# Patient Record
Sex: Female | Born: 1935 | Race: White | Hispanic: No | Smoking: Never smoker
Health system: Southern US, Community
[De-identification: ages and names within clinical notes are randomized; demographics above are authoritative.]

## PROBLEM LIST (undated history)

## (undated) DIAGNOSIS — F32A Depression, unspecified: Secondary | ICD-10-CM

## (undated) DIAGNOSIS — Z8719 Personal history of other diseases of the digestive system: Secondary | ICD-10-CM

## (undated) DIAGNOSIS — K219 Gastro-esophageal reflux disease without esophagitis: Secondary | ICD-10-CM

## (undated) DIAGNOSIS — R011 Cardiac murmur, unspecified: Secondary | ICD-10-CM

## (undated) DIAGNOSIS — M1990 Unspecified osteoarthritis, unspecified site: Secondary | ICD-10-CM

## (undated) DIAGNOSIS — I509 Heart failure, unspecified: Secondary | ICD-10-CM

## (undated) DIAGNOSIS — N390 Urinary tract infection, site not specified: Secondary | ICD-10-CM

## (undated) DIAGNOSIS — M549 Dorsalgia, unspecified: Secondary | ICD-10-CM

## (undated) DIAGNOSIS — F419 Anxiety disorder, unspecified: Secondary | ICD-10-CM

## (undated) DIAGNOSIS — R0600 Dyspnea, unspecified: Secondary | ICD-10-CM

## (undated) DIAGNOSIS — R519 Headache, unspecified: Secondary | ICD-10-CM

## (undated) DIAGNOSIS — E039 Hypothyroidism, unspecified: Secondary | ICD-10-CM

## (undated) DIAGNOSIS — I480 Paroxysmal atrial fibrillation: Secondary | ICD-10-CM

## (undated) DIAGNOSIS — N189 Chronic kidney disease, unspecified: Secondary | ICD-10-CM

## (undated) DIAGNOSIS — I4891 Unspecified atrial fibrillation: Secondary | ICD-10-CM

## (undated) DIAGNOSIS — R600 Localized edema: Secondary | ICD-10-CM

## (undated) DIAGNOSIS — K625 Hemorrhage of anus and rectum: Secondary | ICD-10-CM

## (undated) DIAGNOSIS — E785 Hyperlipidemia, unspecified: Secondary | ICD-10-CM

## (undated) DIAGNOSIS — Z96642 Presence of left artificial hip joint: Secondary | ICD-10-CM

## (undated) DIAGNOSIS — H409 Unspecified glaucoma: Secondary | ICD-10-CM

## (undated) DIAGNOSIS — D649 Anemia, unspecified: Secondary | ICD-10-CM

## (undated) DIAGNOSIS — L89152 Pressure ulcer of sacral region, stage 2: Secondary | ICD-10-CM

## (undated) DIAGNOSIS — R053 Chronic cough: Secondary | ICD-10-CM

## (undated) DIAGNOSIS — G629 Polyneuropathy, unspecified: Secondary | ICD-10-CM

## (undated) DIAGNOSIS — I872 Venous insufficiency (chronic) (peripheral): Secondary | ICD-10-CM

## (undated) DIAGNOSIS — I10 Essential (primary) hypertension: Secondary | ICD-10-CM

## (undated) DIAGNOSIS — I729 Aneurysm of unspecified site: Secondary | ICD-10-CM

## (undated) DIAGNOSIS — M160 Bilateral primary osteoarthritis of hip: Secondary | ICD-10-CM

## (undated) HISTORY — DX: Depression, unspecified: F32.A

## (undated) HISTORY — DX: Dorsalgia, unspecified: M54.9

## (undated) HISTORY — DX: Chronic cough: R05.3

## (undated) HISTORY — DX: Pressure ulcer of sacral region, stage 2: L89.152

## (undated) HISTORY — DX: Urinary tract infection, site not specified: N39.0

## (undated) HISTORY — DX: Paroxysmal atrial fibrillation: I48.0

## (undated) HISTORY — DX: Polyneuropathy, unspecified: G62.9

## (undated) HISTORY — PX: SHOULDER SURGERY: SHX246

## (undated) HISTORY — DX: Localized edema: R60.0

## (undated) HISTORY — DX: Headache, unspecified: R51.9

## (undated) HISTORY — DX: Anemia, unspecified: D64.9

## (undated) HISTORY — PX: CHOLECYSTECTOMY: SHX55

## (undated) HISTORY — DX: Unspecified glaucoma: H40.9

## (undated) HISTORY — DX: Bilateral primary osteoarthritis of hip: M16.0

## (undated) HISTORY — PX: CEREBRAL ANEURYSM REPAIR: SHX164

## (undated) HISTORY — DX: Presence of left artificial hip joint: Z96.642

## (undated) HISTORY — DX: Hyperlipidemia, unspecified: E78.5

## (undated) HISTORY — DX: Unspecified atrial fibrillation: I48.91

## (undated) HISTORY — PX: CATARACT EXTRACTION, BILATERAL: SHX1313

## (undated) HISTORY — DX: Hemorrhage of anus and rectum: K62.5

## (undated) HISTORY — DX: Venous insufficiency (chronic) (peripheral): I87.2

## (undated) HISTORY — PX: ABDOMINAL HYSTERECTOMY: SHX81

## (undated) HISTORY — PX: ANKLE FRACTURE SURGERY: SHX122

## (undated) HISTORY — DX: Unspecified osteoarthritis, unspecified site: M19.90

## (undated) HISTORY — PX: JOINT REPLACEMENT: SHX530

---

## 1971-02-06 DIAGNOSIS — Z90710 Acquired absence of both cervix and uterus: Secondary | ICD-10-CM

## 1971-02-06 HISTORY — DX: Acquired absence of both cervix and uterus: Z90.710

## 1991-02-06 DIAGNOSIS — Z96641 Presence of right artificial hip joint: Secondary | ICD-10-CM

## 1991-02-06 HISTORY — DX: Presence of right artificial hip joint: Z96.641

## 1991-02-06 HISTORY — PX: TOTAL HIP ARTHROPLASTY: SHX124

## 1999-01-18 ENCOUNTER — Encounter: Admission: RE | Admit: 1999-01-18 | Discharge: 1999-01-18 | Payer: Self-pay | Admitting: Family Medicine

## 1999-01-18 ENCOUNTER — Encounter: Payer: Self-pay | Admitting: Family Medicine

## 1999-10-19 ENCOUNTER — Other Ambulatory Visit: Admission: RE | Admit: 1999-10-19 | Discharge: 1999-10-19 | Payer: Self-pay | Admitting: Gynecology

## 2000-02-22 ENCOUNTER — Encounter: Admission: RE | Admit: 2000-02-22 | Discharge: 2000-02-22 | Payer: Self-pay | Admitting: Family Medicine

## 2000-02-22 ENCOUNTER — Encounter: Payer: Self-pay | Admitting: Family Medicine

## 2000-10-30 ENCOUNTER — Emergency Department (HOSPITAL_COMMUNITY): Admission: EM | Admit: 2000-10-30 | Discharge: 2000-10-30 | Payer: Self-pay | Admitting: Emergency Medicine

## 2000-10-30 ENCOUNTER — Encounter: Payer: Self-pay | Admitting: Emergency Medicine

## 2000-11-08 ENCOUNTER — Encounter: Payer: Self-pay | Admitting: Orthopedic Surgery

## 2000-11-12 ENCOUNTER — Inpatient Hospital Stay (HOSPITAL_COMMUNITY): Admission: AD | Admit: 2000-11-12 | Discharge: 2000-11-13 | Payer: Self-pay | Admitting: Orthopedic Surgery

## 2001-02-05 DIAGNOSIS — Z9889 Other specified postprocedural states: Secondary | ICD-10-CM

## 2001-02-05 HISTORY — PX: CHOLECYSTECTOMY: SHX55

## 2001-02-05 HISTORY — DX: Other specified postprocedural states: Z98.890

## 2001-02-05 HISTORY — PX: GALLBLADDER SURGERY: SHX652

## 2001-03-17 ENCOUNTER — Encounter: Admission: RE | Admit: 2001-03-17 | Discharge: 2001-03-17 | Payer: Self-pay | Admitting: Gynecology

## 2001-03-17 ENCOUNTER — Encounter: Payer: Self-pay | Admitting: Gynecology

## 2001-03-18 ENCOUNTER — Encounter: Payer: Self-pay | Admitting: Orthopedic Surgery

## 2001-03-18 ENCOUNTER — Encounter: Admission: RE | Admit: 2001-03-18 | Discharge: 2001-03-18 | Payer: Self-pay | Admitting: Orthopedic Surgery

## 2001-03-26 ENCOUNTER — Encounter: Admission: RE | Admit: 2001-03-26 | Discharge: 2001-03-26 | Payer: Self-pay | Admitting: Gynecology

## 2001-03-26 ENCOUNTER — Encounter: Payer: Self-pay | Admitting: Gynecology

## 2001-03-27 ENCOUNTER — Encounter: Payer: Self-pay | Admitting: Orthopedic Surgery

## 2001-03-27 ENCOUNTER — Ambulatory Visit (HOSPITAL_COMMUNITY): Admission: RE | Admit: 2001-03-27 | Discharge: 2001-03-27 | Payer: Self-pay | Admitting: Orthopedic Surgery

## 2001-12-09 ENCOUNTER — Encounter: Payer: Self-pay | Admitting: General Surgery

## 2001-12-11 ENCOUNTER — Encounter (INDEPENDENT_AMBULATORY_CARE_PROVIDER_SITE_OTHER): Payer: Self-pay | Admitting: Specialist

## 2001-12-11 ENCOUNTER — Encounter: Payer: Self-pay | Admitting: General Surgery

## 2001-12-12 ENCOUNTER — Encounter: Payer: Self-pay | Admitting: General Surgery

## 2001-12-12 ENCOUNTER — Inpatient Hospital Stay (HOSPITAL_COMMUNITY): Admission: AD | Admit: 2001-12-12 | Discharge: 2001-12-13 | Payer: Self-pay | Admitting: General Surgery

## 2002-02-10 ENCOUNTER — Other Ambulatory Visit: Admission: RE | Admit: 2002-02-10 | Discharge: 2002-02-10 | Payer: Self-pay | Admitting: Gynecology

## 2002-05-04 ENCOUNTER — Encounter: Payer: Self-pay | Admitting: Gynecology

## 2002-05-04 ENCOUNTER — Encounter: Admission: RE | Admit: 2002-05-04 | Discharge: 2002-05-04 | Payer: Self-pay | Admitting: Gynecology

## 2002-10-07 ENCOUNTER — Encounter: Payer: Self-pay | Admitting: Gastroenterology

## 2002-10-07 ENCOUNTER — Encounter: Admission: RE | Admit: 2002-10-07 | Discharge: 2002-10-07 | Payer: Self-pay | Admitting: Gastroenterology

## 2002-10-26 ENCOUNTER — Ambulatory Visit (HOSPITAL_COMMUNITY): Admission: RE | Admit: 2002-10-26 | Discharge: 2002-10-26 | Payer: Self-pay | Admitting: Gastroenterology

## 2002-10-26 ENCOUNTER — Encounter (INDEPENDENT_AMBULATORY_CARE_PROVIDER_SITE_OTHER): Payer: Self-pay | Admitting: Specialist

## 2002-11-03 ENCOUNTER — Encounter: Admission: RE | Admit: 2002-11-03 | Discharge: 2002-11-03 | Payer: Self-pay | Admitting: Gastroenterology

## 2002-11-03 ENCOUNTER — Encounter: Payer: Self-pay | Admitting: Gastroenterology

## 2003-07-26 ENCOUNTER — Encounter: Admission: RE | Admit: 2003-07-26 | Discharge: 2003-07-26 | Payer: Self-pay | Admitting: Gynecology

## 2004-06-14 ENCOUNTER — Ambulatory Visit (HOSPITAL_COMMUNITY): Admission: RE | Admit: 2004-06-14 | Discharge: 2004-06-14 | Payer: Self-pay | Admitting: Gastroenterology

## 2004-06-14 ENCOUNTER — Encounter (INDEPENDENT_AMBULATORY_CARE_PROVIDER_SITE_OTHER): Payer: Self-pay | Admitting: Specialist

## 2004-07-25 ENCOUNTER — Ambulatory Visit (HOSPITAL_COMMUNITY): Admission: RE | Admit: 2004-07-25 | Discharge: 2004-07-25 | Payer: Self-pay | Admitting: *Deleted

## 2004-09-27 ENCOUNTER — Encounter: Admission: RE | Admit: 2004-09-27 | Discharge: 2004-09-27 | Payer: Self-pay | Admitting: Family Medicine

## 2005-04-18 ENCOUNTER — Encounter: Admission: RE | Admit: 2005-04-18 | Discharge: 2005-04-18 | Payer: Self-pay | Admitting: *Deleted

## 2005-09-25 ENCOUNTER — Encounter: Payer: Self-pay | Admitting: Vascular Surgery

## 2005-09-25 ENCOUNTER — Ambulatory Visit: Admission: RE | Admit: 2005-09-25 | Discharge: 2005-09-25 | Payer: Self-pay | Admitting: Family Medicine

## 2006-04-02 ENCOUNTER — Other Ambulatory Visit: Admission: RE | Admit: 2006-04-02 | Discharge: 2006-04-02 | Payer: Self-pay | Admitting: Family Medicine

## 2006-04-17 ENCOUNTER — Encounter: Admission: RE | Admit: 2006-04-17 | Discharge: 2006-04-17 | Payer: Self-pay | Admitting: Family Medicine

## 2007-02-06 DIAGNOSIS — Z8679 Personal history of other diseases of the circulatory system: Secondary | ICD-10-CM

## 2007-02-06 DIAGNOSIS — I671 Cerebral aneurysm, nonruptured: Secondary | ICD-10-CM

## 2007-02-06 HISTORY — DX: Personal history of other diseases of the circulatory system: Z86.79

## 2007-02-06 HISTORY — PX: ANKLE SURGERY: SHX546

## 2007-02-06 HISTORY — DX: Cerebral aneurysm, nonruptured: I67.1

## 2007-02-13 ENCOUNTER — Observation Stay (HOSPITAL_COMMUNITY): Admission: EM | Admit: 2007-02-13 | Discharge: 2007-02-15 | Payer: Self-pay | Admitting: Emergency Medicine

## 2007-02-27 ENCOUNTER — Ambulatory Visit (HOSPITAL_COMMUNITY): Admission: AD | Admit: 2007-02-27 | Discharge: 2007-02-27 | Payer: Self-pay | Admitting: Orthopedic Surgery

## 2007-03-21 ENCOUNTER — Encounter: Admission: RE | Admit: 2007-03-21 | Discharge: 2007-03-21 | Payer: Self-pay | Admitting: Orthopedic Surgery

## 2007-05-19 ENCOUNTER — Inpatient Hospital Stay (HOSPITAL_COMMUNITY): Admission: AD | Admit: 2007-05-19 | Discharge: 2007-06-09 | Payer: Self-pay | Admitting: Neurosurgery

## 2007-05-19 ENCOUNTER — Ambulatory Visit: Payer: Self-pay | Admitting: Internal Medicine

## 2007-05-19 ENCOUNTER — Encounter: Payer: Self-pay | Admitting: Emergency Medicine

## 2007-06-06 ENCOUNTER — Ambulatory Visit: Payer: Self-pay | Admitting: Physical Medicine & Rehabilitation

## 2007-06-06 DIAGNOSIS — I609 Nontraumatic subarachnoid hemorrhage, unspecified: Secondary | ICD-10-CM

## 2007-06-06 HISTORY — DX: Nontraumatic subarachnoid hemorrhage, unspecified: I60.9

## 2007-06-09 ENCOUNTER — Ambulatory Visit: Payer: Self-pay | Admitting: Physical Medicine & Rehabilitation

## 2007-06-09 ENCOUNTER — Inpatient Hospital Stay (HOSPITAL_COMMUNITY)
Admission: RE | Admit: 2007-06-09 | Discharge: 2007-06-24 | Payer: Self-pay | Admitting: Physical Medicine & Rehabilitation

## 2007-07-14 ENCOUNTER — Ambulatory Visit (HOSPITAL_COMMUNITY): Admission: RE | Admit: 2007-07-14 | Discharge: 2007-07-14 | Payer: Self-pay | Admitting: Neurosurgery

## 2007-07-14 ENCOUNTER — Encounter
Admission: RE | Admit: 2007-07-14 | Discharge: 2007-10-12 | Payer: Self-pay | Admitting: Physical Medicine & Rehabilitation

## 2007-07-17 ENCOUNTER — Emergency Department (HOSPITAL_COMMUNITY): Admission: EM | Admit: 2007-07-17 | Discharge: 2007-07-17 | Payer: Self-pay | Admitting: Emergency Medicine

## 2007-07-24 ENCOUNTER — Encounter
Admission: RE | Admit: 2007-07-24 | Discharge: 2007-07-28 | Payer: Self-pay | Admitting: Physical Medicine & Rehabilitation

## 2007-07-28 ENCOUNTER — Ambulatory Visit: Payer: Self-pay | Admitting: Physical Medicine & Rehabilitation

## 2007-08-20 ENCOUNTER — Encounter: Admission: RE | Admit: 2007-08-20 | Discharge: 2007-08-20 | Payer: Self-pay | Admitting: Neurosurgery

## 2007-10-31 ENCOUNTER — Encounter
Admission: RE | Admit: 2007-10-31 | Discharge: 2007-11-03 | Payer: Self-pay | Admitting: Physical Medicine & Rehabilitation

## 2007-11-03 ENCOUNTER — Ambulatory Visit: Payer: Self-pay | Admitting: Physical Medicine & Rehabilitation

## 2008-10-29 IMAGING — CR DG CERVICAL SPINE 2 OR 3 VIEWS
5 series · 5 of 5 positions shown · non-contrast
Comparison: None

CLINICAL DATA: Left side weakness

CERVICAL SPINE - 2-3 VIEW

[w c-spine lat]
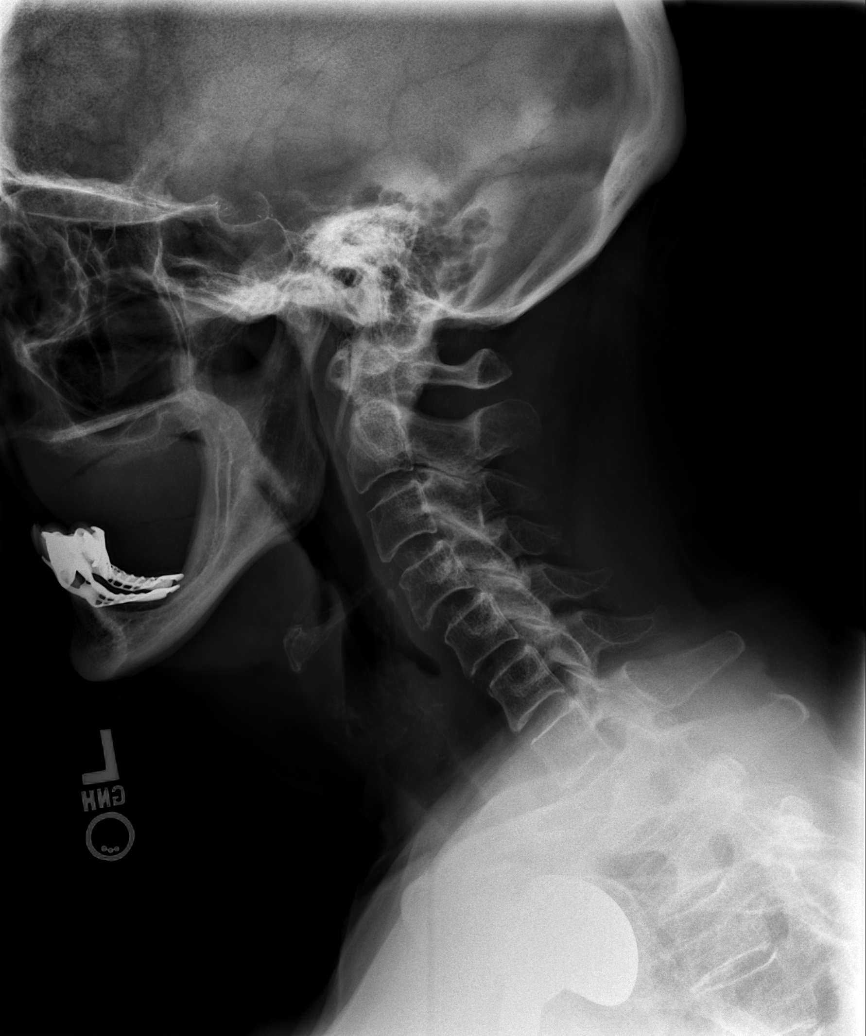

[w c-spine a.p. (1 of 2)]
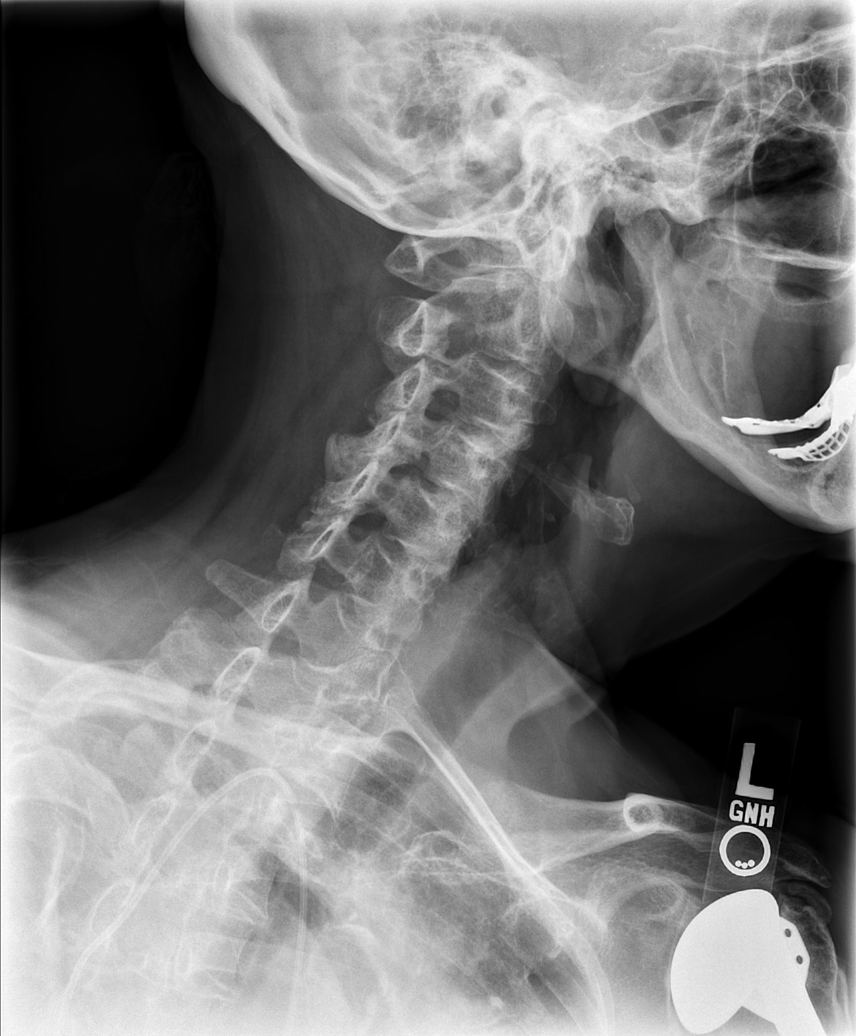

[w c-spine odontoid (1 of 2)]
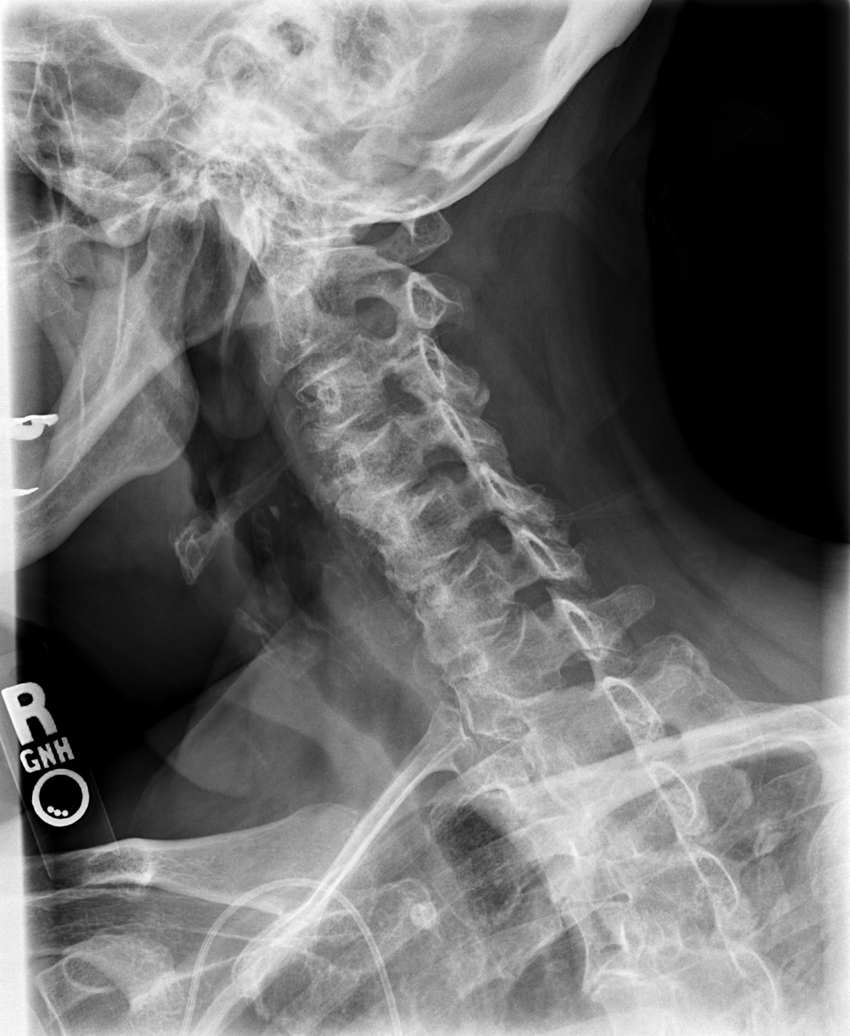

[w c-spine a.p. (2 of 2)]
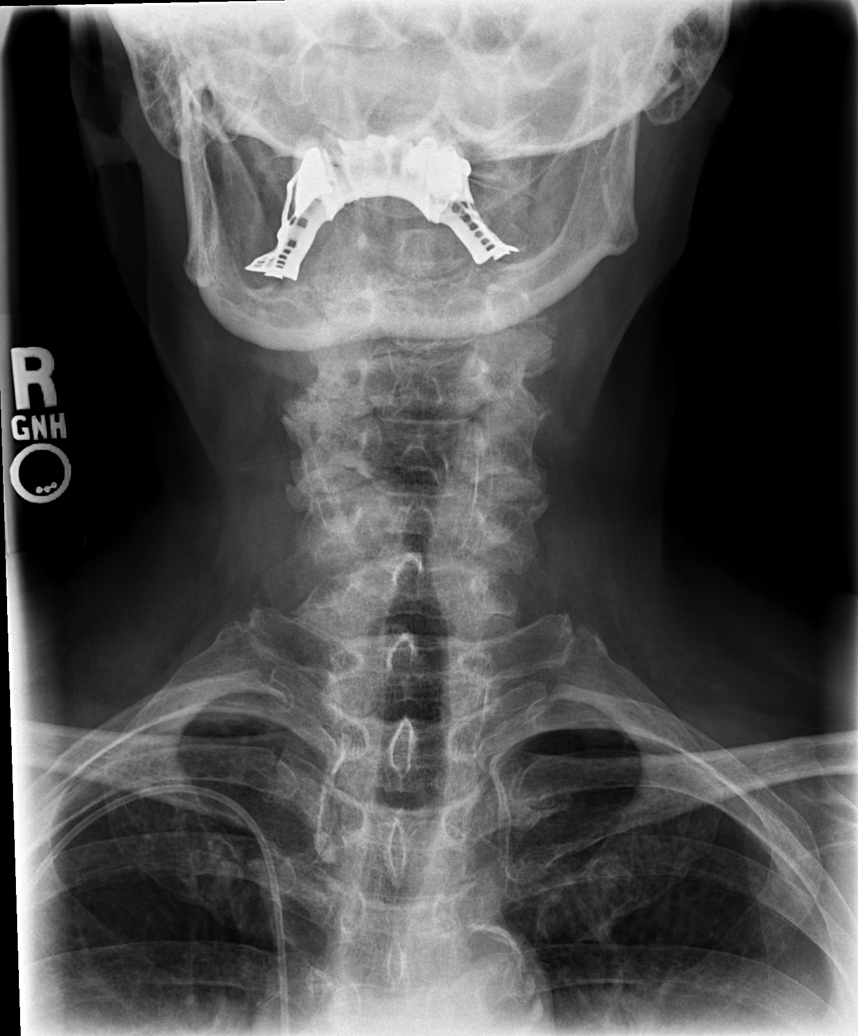

[w c-spine odontoid (2 of 2)]
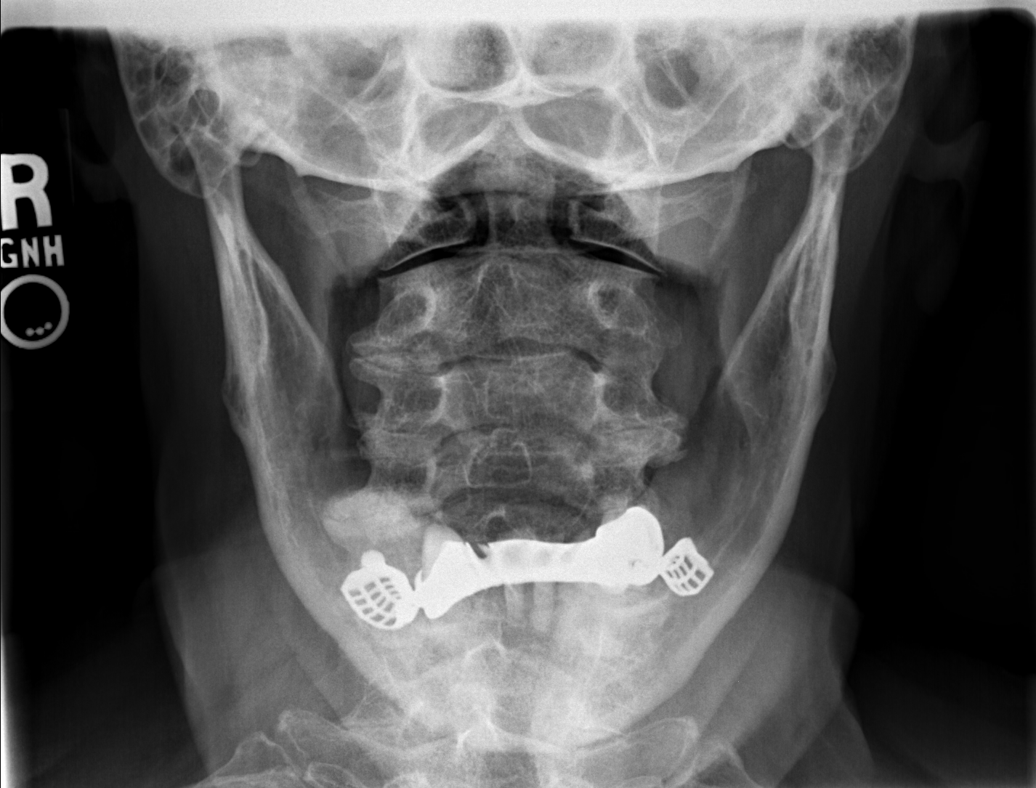

[5 of 5 positions shown; findings below may reference images not displayed]

FINDINGS: Bones are diffusely demineralized.  There is no evidence
for acute fracture.  Trace anterolisthesis of C4 on 5 is likely
secondary to the facet disease at this level.  There is prominent
facet osteoarthritis on the right at C4-5 and on the left at C3-4
and C5-6.
IMPRESSION: No acute bony abnormality.

## 2010-02-06 ENCOUNTER — Inpatient Hospital Stay (HOSPITAL_COMMUNITY)
Admission: EM | Admit: 2010-02-06 | Discharge: 2010-02-08 | Payer: Self-pay | Source: Home / Self Care | Admitting: Emergency Medicine

## 2010-02-08 LAB — COMPREHENSIVE METABOLIC PANEL
ALT: 55 U/L — ABNORMAL HIGH (ref 0–35)
AST: 48 U/L — ABNORMAL HIGH (ref 0–37)
Albumin: 2.4 g/dL — ABNORMAL LOW (ref 3.5–5.2)
Alkaline Phosphatase: 172 U/L — ABNORMAL HIGH (ref 39–117)
BUN: 10 mg/dL (ref 6–23)
CO2: 27 mEq/L (ref 19–32)
Calcium: 8.5 mg/dL (ref 8.4–10.5)
Chloride: 103 mEq/L (ref 96–112)
Creatinine, Ser: 1.04 mg/dL (ref 0.4–1.2)
GFR calc Af Amer: >60 mL/min (ref >60)
GFR calc non Af Amer: 51 mL/min — ABNORMAL LOW (ref >60)
Glucose, Bld: 119 mg/dL — ABNORMAL HIGH (ref 70–99)
Potassium: 3.9 mEq/L (ref 3.5–5.1)
Sodium: 137 mEq/L (ref 135–145)
Total Bilirubin: 0.5 mg/dL (ref 0.3–1.2)
Total Protein: 5.7 g/dL — ABNORMAL LOW (ref 6.0–8.3)

## 2010-02-08 LAB — CBC
HCT: 34 % — ABNORMAL LOW (ref 36.0–46.0)
Hemoglobin: 10.9 g/dL — ABNORMAL LOW (ref 12.0–15.0)
MCH: 30 pg (ref 26.0–34.0)
MCHC: 32.1 g/dL (ref 30.0–36.0)
MCV: 93.7 fL (ref 78.0–100.0)
Platelets: 177 10*3/uL (ref 150–400)
RBC: 3.63 MIL/uL — ABNORMAL LOW (ref 3.87–5.11)
RDW: 13.8 % (ref 11.5–15.5)
WBC: 8.2 10*3/uL (ref 4.0–10.5)

## 2010-02-26 ENCOUNTER — Encounter: Payer: Self-pay | Admitting: Neurosurgery

## 2010-03-09 NOTE — H&P (Signed)
[name], [name]                ACCOUNT NO.:  [id number]  MEDICAL RECORD NO.:  [id number]          PATIENT TYPE:  EMS  LOCATION:  MAJO                         FACILITY:  [facility]  PHYSICIAN:  Ladell Pier, M.D.   DATE OF BIRTH:  [birth year]  DATE OF ADMISSION:  02/06/2010 DATE OF DISCHARGE:                             HISTORY & PHYSICAL   CHIEF COMPLAINT:  Urinary frequency, fevers and headache.  HISTORY OF PRESENT ILLNESS:  The patient is an 77 year old white female with past medical history significant for multiple medical problems including an aneurysm back in 2009.  She also has hypertension, dyslipidemia, GERD, depression.  The patient lives alone.  She was brought to the emergency room by her daughter secondary to increase in nausea for the past 3 days.  She normally has some underlying nausea for the past 3 years since her cerebral aneurysm.  She also complained of some headache in the posterior section of her head.  She states that she has chronic headache but that it has gotten worse over the past 3 days. She has no vomiting.  No diarrhea.  She also has decreased p.o. intake and urinary frequency.  She checked her temperature last night and it was 101.4.  She also has had some chills.  No abdominal pain.  Maybe a little bit of pelvic pressure.  PAST MEDICAL HISTORY: 1. Significant for hypertension. 2. Cerebral aneurysm. 3. Hypothyroidism. 4. Depression. 5. GERD. 6. Dyslipidemia.  PAST SURGICAL HISTORY: 1. Cholecystectomy. 2. Hip replacement. 3. Hysterectomy. 4. Rotator cuff repair. 5. History of cerebral aneurysm back in 2009. 6. Ankle surgery.  FAMILY HISTORY:  Both parents are deceased.  SOCIAL HISTORY:  The patient is widowed.  She does not smoke.  She drinks socially.  She lives alone.  She has 3 children.  MEDICATIONS: 1. Synthroid 88 mcg daily. 2. Sertraline 100 mg daily. 3. Ranitidine 300 mg twice daily. 4. Pravachol 20 mg daily. 5. Metoprolol  100 mg half a tab twice daily. 6. Maxzide 37.5/25 mg daily. 7. Aspirin 325 mg daily.  ALLERGIES:  To MORPHINE, causes her to have altered mental status.  REVIEW OF SYSTEMS:  Negative, otherwise stated in HPI.  PHYSICAL EXAM:  VITAL SIGNS:  Temperature 98.5, pulse of 80, respirations 20.  Blood pressure 148/60, pulse ox 100% on room air. GENERAL:  The patient is an obese white female lying on the stretcher. HEENT:  Normocephalic, atraumatic.  Pupils reactive to light.  Throat is without erythema. CARDIOVASCULAR:  Regular rate and rhythm. LUNGS:  Clear bilaterally. ABDOMEN:  Obese.  Positive bowel sounds.  Maybe a mild tenderness in the pelvic area. EXTREMITIES:  2+ edema left greater than right, that is chronic. NEUROLOGICAL:  Nonfocal.  It was difficult for her to raise her right leg secondary to back pain, otherwise cranial nerves II-XII intact.  Labs:  Urine too many to count WBCs, positive nitrite and large leukocyte in the urine.  Sodium 136, potassium 4.8, chloride 100, CO2 27, glucose 143, BUN 30, creatinine 0.95, calcium 8.8, WBC 13.5, hemoglobin 12.8, MCV 91.9, platelets 195.  Chest x-ray left lung base atelectasis without acute  cardiopulmonary process.  Head CT stable appearance of right ICA stent.  No acute bleed or infarct identified.  No acute hemorrhage, infarct or mass effect identified.  Ventricles are normal.  No extra-axial fluid collection. Skull is unremarkable.  ASSESSMENT AND PLAN: 1. Acute pyelonephritis. 2. Hypertension. 3. Hypothyroidism. 4. Depression. 5. Gastroesophageal reflux disease. 6. Dyslipidemia. 7. Mild headache with history of cerebral aneurysm.  Will admit the patient to the hospital, treat her acute pyelonephritis with IV Rocephin, get urine culture, get blood cultures.  For the hypertension will continue her on her home medications.  For her thyroid problems will check TSH.  Depression:  Continue antidepressant. Gastroesophageal  reflux disease:  Continue proton pump inhibitor. Dyslipidemia:  Continue cholesterol medications.  With her history of cerebral aneurysm if she does develop any sort of severe headache or neck stiffness then will get an MRI to evaluate her aneurysm but right now she does not have any focal neurologic deficit, her headache is much better since she has been treated for the urinary tract infection.  Time spent with the patient and doing this admission and talking to daughter is approximately 1 hour.     Ladell Pier, M.D.     [place]/MEDQ  D:  02/06/2010  T:  02/06/2010  Job:  [id number]  cc:   Dr Tiburcio Pea  Electronically Signed by Ladell Pier M.D. on 03/09/2010 01:39:31 PM

## 2010-04-17 LAB — COMPREHENSIVE METABOLIC PANEL
ALT: 63 U/L — ABNORMAL HIGH (ref 0–35)
CO2: 24 mEq/L (ref 19–32)
Calcium: 8.1 mg/dL — ABNORMAL LOW (ref 8.4–10.5)
GFR calc non Af Amer: 48 mL/min — ABNORMAL LOW (ref >60)
Glucose, Bld: 127 mg/dL — ABNORMAL HIGH (ref 70–99)
Sodium: 136 mEq/L (ref 135–145)
Total Bilirubin: 0.8 mg/dL (ref 0.3–1.2)

## 2010-04-17 LAB — CULTURE, BLOOD (ROUTINE X 2)
Culture  Setup Time: 201201022322
Culture  Setup Time: 201201022322
Culture: NO GROWTH
Culture: NO GROWTH

## 2010-04-17 LAB — URINE MICROSCOPIC-ADD ON

## 2010-04-17 LAB — URINALYSIS, ROUTINE W REFLEX MICROSCOPIC
Ketones, ur: NEGATIVE mg/dL
Nitrite: POSITIVE — AB
Protein, ur: 100 mg/dL — AB

## 2010-04-17 LAB — DIFFERENTIAL
Basophils Absolute: 0 10*3/uL (ref 0.0–0.1)
Basophils Relative: 0 % (ref 0–1)
Eosinophils Absolute: 0 10*3/uL (ref 0.0–0.7)
Eosinophils Relative: 0 % (ref 0–5)

## 2010-04-17 LAB — BASIC METABOLIC PANEL
BUN: 13 mg/dL (ref 6–23)
CO2: 27 mEq/L (ref 19–32)
Chloride: 100 mEq/L (ref 96–112)
Creatinine, Ser: 0.95 mg/dL (ref 0.4–1.2)

## 2010-04-17 LAB — URINE CULTURE
Colony Count: >=100000
Culture  Setup Time: 201201022027

## 2010-04-17 LAB — CBC
HCT: 34.5 % — ABNORMAL LOW (ref 36.0–46.0)
Hemoglobin: 11.2 g/dL — ABNORMAL LOW (ref 12.0–15.0)
MCH: 30.3 pg (ref 26.0–34.0)
MCH: 30.4 pg (ref 26.0–34.0)
MCHC: 32.5 g/dL (ref 30.0–36.0)
MCV: 91.9 fL (ref 78.0–100.0)
Platelets: 195 10*3/uL (ref 150–400)
RDW: 13.6 % (ref 11.5–15.5)

## 2010-04-17 LAB — TSH: TSH: 1.563 u[IU]/mL (ref 0.350–4.500)

## 2010-06-20 NOTE — Op Note (Signed)
NAMEPAOLINA, KARWOWSKI                ACCOUNT NO.:  [id number]   MEDICAL RECORD NO.:  [id number]          PATIENT TYPE:  INP   LOCATION:  2899                         FACILITY:  [facility]   PHYSICIAN:  Doralee Albino. Carola Frost, M.D. DATE OF BIRTH:  07/07/1927   DATE OF PROCEDURE:  02/27/2007  DATE OF DISCHARGE:                               OPERATIVE REPORT   PREOPERATIVE DIAGNOSIS:  Left bimalleolar ankle fracture.   POSTOPERATIVE DIAGNOSIS:  Left bimalleolar ankle fracture.   PROCEDURE:  1. Open reduction and internal fixation of left bimalleolar ankle      fracture.  2. Stress view of the ankle using fluoroscopy to revaluate the      syndesmosis.   SURGEON:  Doralee Albino. Carola Frost, MD   ASSISTANT:  Mearl Latin, PA   ANESTHESIA:  General.   FINDINGS:  Stable syndesmosis.   ESTIMATED BLOOD LOSS:  50 mL.   COMPLICATIONS:  None.   TOURNIQUET:  None.   DISPOSITION:  PACU.   CONDITION:  Stable.   BRIEF SUMMARY AND INDICATION FOR PROCEDURE:  Hatley Henegar is a 77-year-  old female with a left displaced bimalleolar fracture dislocation  initially treated with closed reduction of the ankle dislocation, as  well as 10-14 days of soft tissue surveillance and a posterior and  stirrup splint.  The patient's skin was eventually deemed stable for  surgical intervention.  At that time she had some slight residual  displacement of her fibula, but appeared to have an anatomic reduction  of the medial malleolus.  We discussed preoperative risks and benefits  of surgery including the possibility of infection, nerve injury, vessel  injury, need for further surgery, DVT, PE, arthritis, decreased range of  motion, malunion, nonunion, others including heart attack and stroke.  After full discussion the patient wished to proceed.   BRIEF DESCRIPTION OF PROCEDURE:  Ms. Demonte was administered  preoperative antibiotics, taken to the operating room where general  anesthesia was induced.  The left lower  extremity was prepped and draped  in the usual sterile fashion.  No tourniquet was used during the  procedure.  We made a standard lateral approach to the distal fibula,  looking carefully for the superficial peroneal nerve in the proximal  portion of the wound.  It was not identified, but presumed in the  anterior soft tissues and again meticulous, careful dissection was  performed through this area.  As we continued distally, we encountered  the fracture site, would not remove any of the surrounding periosteum.  We allowed for slight distraction and cleaned the fracture site with a  small curet and lavage.  We were then able to obtain an anatomic  reduction and hold this with a lobster clamp.  We then placed an  anterior to posterior lag screw and a posterior buttress plate that  pushed directly against the forces of displacement, which was in the  posterolateral displacement.  Once this was placed, we obtained AP  mortise and lateral views showing what appeared to be anatomic  reduction.  As there was no displacement of the medial malleolus, we  decide to go ahead and proceed with percutaneous placement of 2 medial  malleolar screws.  The first screw was slightly lateral to the distal  tip, but did obtain a good purchase.  We are concerned about placing  either an anterior or posterior screw, as this could resulting in  cracking the fracture of the medial malleolus, and consequently we chose  to place 1 medial to this and obtained excellent purchase here as well,  making sure that this was out of the joint.  It was, with the shaft of  the screw passing just medial to the joint line and this was confirmed  on our drill images as well.  Thirty-five partially threaded screws were  placed, attaining a good compression. We then irrigated both sides  thoroughly after obtaining final 3-vew images, and performed a standard  layered closure with 2-0 Vicryl and 3-0 nylon.  Sterile, gently   compressive dressing was applied, and then a posterior and stirrup  splint.   The patient was awakened from anesthesia and transferred to PACU in  stable condition.   It should be noted that following fixation we did perform a live fluoro  stress view using external rotation to hold the ankle in the mortise  projection and did not see any syndesmotic widening or other signs of  syndesmotic instability.   PROGNOSIS:  Ms. Quist should do well following repair of her  bimalleolar ankle fracture.  She will be non weightbearing for the next  6 weeks with graduated weightbearing thereafter.  She will have  unrestricted range of motion when she returns in 2 weeks, at which time  we will remove her splint and apply a removable brace.  She will  continue on DVT prophylaxis as well.      Doralee Albino. Carola Frost, M.D.  Electronically Signed     MHH/MEDQ  D:  02/27/2007  T:  02/27/2007  Job:  [id number]

## 2010-06-20 NOTE — Assessment & Plan Note (Signed)
HISTORY:  [name] returns to clinic today accompanied by her great-  granddaughter.  The patient is a 77 year old female with a history of  hypertension and glaucoma.  She was admitted to [facility] on  May 19, 2007, with severe headaches of 2 days duration with decrease  in mental status.  She was found to have an extensive subarachnoid  hemorrhage requiring intraventricular catheter placement by Dr.  Newell [name].  She was independent on the same day for airway protection.  Attempts of coiling were unsuccessful and she underwent stenting of the  right internal cerebral artery aneurysm.  There was no change in the  circulation per Interventional Radiology.  She was self-extubated on  May 26, 2007, but required reintubation on May 27, 2007, secondary  to fever and desaturations.  She was finally extubated on May 29, 2007.  Repeat angiography was done on June 02, 2007, with stent  placement, but coiling was not done secondary to vasospasms.  She was  extubated postprocedure without difficulty and remains on Dilantin for  seizure prophylaxis.   The patient was moved to the Rehabilitation Unit on Jun 09, 2007, and  remained there until discharge on Jun 24, 2007.   Since discharge, the patient has been living at her home, but has 24-  hour assistance.  She continues to attend outpatient physical and  occupational therapy at [facility] address.  She did see Dr.  Newell [name], her neurosurgeon.  They have sent her films off to [place]  for evaluation and she is due to follow up with Dr. Newell [name] once those  films and evaluations return to this office.  The patient continues to  take scopolamine and does report some slight decrease in nausea,  although she reports nausea every a.m.  Her family reports that she has  improved appetite overall.  She has just returned from a family visit to  the beach recently and was able to walk with and without the cane.  She  continues on  outpatient therapy and is due for follow up with Dr. Tiburcio [name]  in 2 weeks' time.   The family member present today does ask about an antidepressant  medication for her grandmother.  They have noticed some occasional  depression and occasional crying since she is not back to her normal  level of health.  She still is interested in living on her own and  interested in driving that she herself reports that she does not feel  she is stable to drive at the present time.   REVIEW OF SYSTEMS:  Positive for poor appetite, abdominal pain, painful  urination, urinary retention, nausea, weight gain, and limb swelling  along with shortness of breath.   MEDICATIONS:  1. Scopolamine patch daily.  2. Vitamins daily.  3. Aspirin 325 mg daily.  4. Pepcid 40 mg daily.  5. Synthroid 88 mcg daily.  6. Metoprolol 50 mg 2 tablets b.i.d.  7. Phenytoin 100 mg 1/2 tablet b.i.d.   PHYSICAL EXAMINATION:  GENERAL:  A well-appearing, moderately overweight  adult female in mild-to-no acute discomfort.  VITAL SIGNS:  Blood pressure 160/57 with a pulse of 88, respiratory rate  18, and O2 saturation 97% on room air.  EXTREMITIES:  She ambulates with and without a single-point cane, but  reports persistent problems with balance involving her left ankle where  she has had prior fracture.  She reports that overall her nausea is only  slightly improved and that should improve gradually over time.  Her  appetite is definitely improved according to the family.   No refill on medication is necessary.  If there is a need for a refill  on the scopolamine patch once they get home, they can certainly call and  we will refill it.  In the meantime, we had given her a script for  Zoloft to use 50 mg daily with 3 refills for her symptoms of depression.  We will plan on seeing her in followup in approximately 2-3 months'  time and she will continue on outpatient therapies at this point with  followup with Dr. Tiburcio [name] and Dr.  Newell [name] as noted above.           ______________________________  [name], M.D.     DC/MedQ  D:  07/28/2007 16:13:39  T:  07/29/2007 06:43:20  Job #:  [id number]

## 2010-06-20 NOTE — Consult Note (Signed)
Connie, [name]                ACCOUNT NO.:  [id number]   MEDICAL RECORD NO.:  [id number]          PATIENT TYPE:  OBV   LOCATION:  5511                         FACILITY:  [facility]   PHYSICIAN:  Doralee Albino. Carola Frost, M.D. DATE OF BIRTH:  07/03/1927   DATE OF CONSULTATION:  02/14/2007  DATE OF DISCHARGE:  02/15/2007                                 CONSULTATION   REQUESTING PHYSICIAN:  Lear Ng, M.D.   REASON FOR CONSULTATION:  Left ankle pain, left ankle fracture.   HISTORY OF PRESENT ILLNESS:  [name] is a 77 year old Caucasian  female who is very independent who was out running errands this  afternoon when she sustained an injury to her left ankle.  Patient  states that she was walking into the store and slipped off the curb and  landed on her left ankle.  Patient also states that as she fell her  contralateral foot was also caught in the door at the same time but  majority of the trauma was to the left ankle.  Patient states that she  fell about3 o'clock this afternoon and had immediate onset of pain.  However, patient was able to get up after the fall and drove herself  home.  She did have difficulty getting out of the car.  Upon arriving  home it was determined that the patient did need to seek medical  services at which time she presented to [facility]  for evaluation.  Patient had initial onset of severe pain.  She states  that it was a 7 out of 10 at the time, but patient is able to tolerate  pain exceptionally well.  There is no radiation of pain up the left leg  or down into the digits.  Pain is localized to the left ankle.  There is  a significant amount of swelling.  There are no fracture blisters  apparent at this time.  Patient has not taken any pain medication for  resolution of pain.  Pain is described as sharp in nature and is  aggravated or exacerbated by movement.  Patient does get some relief  when she remains still.  Patient has never  experienced any type of  injury to this ankle in the past.   PAST MEDICAL HISTORY:  1. Hypertension.  2. Hypothyroidism.  3. Peripheral neuropathy.   PAST SURGICAL HISTORY:  1. Left shoulder hemiarthroplasty with manipulation and lysis of      adhesions under anesthesia.  2. Right total hip arthroplasty.   FAMILY HISTORY:  Nonsignificant and noncontributory.   SOCIAL HISTORY:  Patient is a very independent 77 year old Caucasian  female who lives on her own.  She denies any alcohol use and denies any  smoking.   ALLERGIES:  No known drug allergies.   MEDICATIONS:  Lasix, Cymbalta, metoprolol, and levothyroxine.   REVIEW OF SYSTEMS:  Patient denies nausea, vomiting, diarrhea, fevers,  chills.  Patient also denies chest pain, palpitations, dyspnea.  Patient  denied any syncope or loss of consciousness prior to and after the fall.   PHYSICAL EXAMINATION:  VITALS:  Temperature 97.5, pulse 65, respirations  16, blood pressure 155/71.  GENERAL:  Patient is awake, alert, pleasant.  Is in no acute distress  and is resting comfortably in the gurney.  Patient states that she is  not having any significant pain at this time and on initial evaluation  patient also acknowledges that she did not have any pain medications up  until that point in time.  HEENT:  Pupils are equal, round, and reactive to light and  accommodation.  Moist mucous membranes are noted.  Sclerae are  anicteric.  Oropharynx is nonerythematous.  NECK:  Supple with no lymphadenopathy appreciated.  CHEST:  Clear to auscultation bilaterally.  BREASTS:  Not examined as not indicated for procedure.  HEART:  S1, S2.  Regular rate and rhythm.  No murmurs, rubs, or gallops.  ABDOMEN:  Soft.  Nontender.  Nondistended.  No masses.  Positive bowel  sounds.  PELVIC AND GENITOURINARY:  Not examined secondary to not indicated for  procedure.  EXTREMITIES:  There is significant edema on the left ankle.  The skin is  taught over  and around the left ankle.  There is no gross deformity.  Patient is tender to palpation with moderate pressure.  Distal motor  function of the EHL, FHL, anterior tibialis, posterior tibialis is  intact.  Flexion and extension of the greater and lesser toes are also  intact.  Sensation along the DPN, SPN, TN is intact within patient's  limitations given her past history of peripheral neuropathy.  Upon  manipulation of the skin, no wrinkling is demonstrated.  This suggested  soft tissue swelling and trauma is too severe at this point to consider  surgery at this point in time.   LABS AND X-RAYS:  Three views of the left ankle demonstrated a  bimalleolar fracture with subluxation of the left tibiotalar joint.  There is also a fracture of the first proximal phalange of the great  toe.   ASSESSMENT:  Left ankle bimalleolar fracture with subluxation and  fracture of the first proximal phalange of the great toe.   PLAN:  At this point in time we will attempt closed reduction with  splinting in a posterior and stirrup splint.  We will also admit the  patient for observation and pain control.  Based on the clinical  findings the fracture site is not appropriate for surgical intervention  at this time secondary to soft tissue damage.  We anticipate that the  patient will follow up in the office in approximately one week's time  for further evaluation of the soft tissue.  Patient has also been  informed and educated on the risks and benefits of possible surgical  intervention.  Based on the information presented to her she has decided  to proceed with surgical intervention if and when that time is deemed  appropriate.  Patient is also to be non-weight bearing on the left lower  extremity and is to elevate the left lower extremity for assistance with  resolution of swelling.  The patient may also use ice on an as needed  basis for soft tissue swelling.      Mearl Latin, PA      Doralee Albino. Carola Frost, M.D.  Electronically Signed    KWP/MEDQ  D:  02/17/2007  T:  02/18/2007  Job:  [id number]

## 2010-06-20 NOTE — Discharge Summary (Signed)
[name], [name]                ACCOUNT NO.:  [id number]   MEDICAL RECORD NO.:  [id number]          PATIENT TYPE:  IPS   LOCATION:  4033                         FACILITY:  [facility]   PHYSICIAN:  Ellwood Dense, M.D.   DATE OF BIRTH:  [birth year]   DATE OF ADMISSION:  06/09/2007  DATE OF DISCHARGE:  06/24/2007                               DISCHARGE SUMMARY   DISCHARGE DIAGNOSES:  1. Subarachnoid hemorrhage, grade III at circle of Willis, secondary      to right internal carotid artery saccular aneurysm.  2. Hypertension.  3. Acute blood loss anemia.  4. Glaucoma.  5. History of hyperthyroid.  6. Lethargy, resolved.  7. Anorexia, improved.  8. History of bilateral extremity neuropathy.   HISTORY OF PRESENT ILLNESS:  [name] is a 77 year old female with  history of hypertension, glaucoma, admitted to [facility] May 19, 2007  with severe headache of 2-day duration and decrease in mental status.  The patient was found to have extensive subarachnoid hemorrhage,  requiring intraventricular cath placement by Dr. Newell Coral.  She was  intubated on the same day for airway protection, attempts at coiling  right ICA aneurysm with stent placement done, coiling unsuccessful and  no change in ICA circulation per interventional radiology.  The patient  self-extubated on April 20, but required reintubation, April 21,  secondary to fever and desaturation secondary to H-flu positive BAL and  Enterobacter UTI.  She was extubated on April 23.  On April 27, repeat  angio was done with stent placement, coiling not done secondary to  vasospasms.  Extubated post procedure without difficulty and on Dilantin  for seizure prophylaxis.  She currently continues on Nimotop for BP and  management and prevention of  vasospasms with recommendations to keep  systolic blood pressure less than 150, question of craniotomy for  aneurysm clipping in the future.  Therapies are currently ongoing.  The  patient is  noted to be on regular diet and liquids.  Noted to have short-  term memory deficit and disorientation continues to have headache and  dizziness with mobility, able to sit edge of bed 10 minutes with  supervision, able to stand with total assist 65% for 2 minutes.  Rehab  consults for further therapies.   PAST MEDICAL HISTORY:  See discharge diagnosis plus history of  dyslipidemia, left shoulder hemiarthroplasty in 2002, bilateral lower  extremity numbness, secondary neuropathy, history of gastritis and  esophagitis, diverticulosis, hemihysterectomy, left ankle ORIF,  secondary to fall in January 2009, cholecystectomy in 2003, and right  total hip replacement.   ALLERGIES:  MORPHINE.   FAMILY HISTORY:  Positive for cancer and question of aneurysm in a  relative.   SOCIAL HISTORY:  The patient lives alone in one level home with rapid  entry.  Does not use any tobacco or alcohol.  She was very active  independent in driving prior to admission.   HOSPITAL COURSE:  [name] was admitted to rehab on Jun 09, 2007  for inpatient therapies to consist of PT, OT, and speech therapy.  At  time of admission,  the patient was known to have complaints of  headaches, dizziness as well as nausea and vomiting.  She was also noted  to have issues with insomnia and anorexia.  She was started on trazodone  nightly to help with sleep hygiene.  Megace additionally was added to  help with p.o. intake.  Initially, meclizine was used to see if this  could help her dizziness symptoms which precipitated into nausea, as  this was minimally effective scopolamine patch was added in addition to  low dose of meclizine every morning and this has helped control her  symptoms.  Overall, dizziness and nausea symptoms are much improved by  time of discharge.  The patient's blood pressures have been monitored on  b.i.d. basis during this stay.  These have ranged from 110-120 systolic,  50-60 diastolic.  Heart  rate overall in the 60-70s range.  Last weight  is at 105 kg.   LABORATORY DATA:  Labs were done past admission revealing hemoglobin  9.6, hematocrit 28.2, white count 6.66, platelets 519.  Check of lytes  revealed sodium 141, potassium 3.9, chloride 104, CO2 of 28, BUN 5,  creatinine 0.54, glucose 97, albumin 2.9, alkaline phos 165, ALT 19, and  AST 15.  Secondary to low albumin levels, her Dilantin dose was  decreased to 200 mg b.i.d..  Recheck LFTs and Dilantin level done on Jun 21 2007.  This revealed SGOT 17, SGPT 15, alkaline phos 135, albumin  3.2.  Dilantin level within normal limits at 11.3.  Check of lytes  revealed sodium 140, potassium 3.5, chloride 109, CO2 of 23, BUN 7,  creatinine 0.69, and glucose 152.  H&H was noted to be improved at 10.6  and 31.8.  Followup CCT of May 15 was negative for hydrocephalus or new  subarachnoid hemorrhage.  Dr. Newell Coral has followed up with the patient  and has discontinued the patient's Nimotop with recommendations to  follow up with him for repeat x-rays in the future.  The patient did  have a check of UA/UC.  In her past admission, Foley was discontinued on  Jun 12, 2007 and voiding was monitored.  The patient was noted be voiding  without any signs of retention.  The patient's urine culture showed  Enterobacter and the patient was treated with 5-day course of Cipro for  this.  The patient continues to have issues with headaches.  Oxycodone  was scheduled every morning prior to therapies as well as at lunchtime.  This additional dose was added nightly to help with sleep hygiene as the  patient reports restlessness at night secondary to distraction due to  pain.   Therapies have been ongoing during this stay.  The patient's endurance  is some improved.  She continued to complain of nausea and fatigue with  occasional shakiness however, overall, this is much improved.  The  patient is currently at supervision level for transfers  supervision  level for ambulating 150 feet with rolling walker requires two standard  rest breaks, secondary to exhaustion past activity.  She is noted to  have slight protuberations with sideways gait and sidestepping  activities.  She is able to recover balance at supervision level.  OT  has been working with the patient to focus on problem solving as well as  recalling new information for home management and day-to-day tasks.  She  is able to complete functional tasks at supervision level.  Requires  supervision with setup for bathing and dressing.  She is noted  to have  improvement in exercise tolerance with rest breaks for self-care.  Speech therapy has been focusing on the patient necessitating attention  to task as well as demonstrating emerging awareness of her deficits.  The patient is currently at min assistance supervision to maintain to  tasks.  Min assist for identifying problems and problem solving  functional daily tasks.  Continues to have poor short-term memory for  recalling daily events with treatment and treatment sessions.  The  patient will continue to have further followup home health PT, OT,  speech therapy as well as aid through [facility].  On  Jun 14, 2007, the patient is discharged to home.   DISCHARGE MEDICATIONS:  1. Dilantin 100 mg 2 p.o. b.i.d.  2. Antivert 25 mg half p.o. morning.  3. Ecotrin 325 mg a day.  4. Ferrous sulfate 325 mg b.i.d.  5. Lopressor 50 mg b.i.d.  6. Lumigan 0.03% 1 gtt. OU nightly.  7. Prilosec OTC 20 mg a day.  8. Senokot-S 2 p.o. nightly.  9. Synthroid 88 mcg 1 per day.  10.Scopolamine patch 1.5 mg change q. 3 days.  11.Trazodone 50 mg half p.o. nightly  12.Zofran 4 mg  1-1/2 q.6-8 h p.r.n. for nausea.   DIET:  Low-fat.   ACTIVITY LEVEL:  Is 24-hour supervision.  No strenuous activity.  No  alcohol, no smoking, or no driving.  Walk with walker and assistance,  supervision.   FOLLOW UP:  The patient to  follow up with Dr. Thomasena Edis on July 28, 2007  at 3:20.  Follow up with Dr. Newell Coral in couple of weeks.  Follow up  with Dr. Tiburcio Pea for routine check in a couple weeks.      Greg Cutter, P.A.    ______________________________  Ellwood Dense, M.D.    PP/MEDQ  D:  06/24/2007  T:  06/25/2007  Job:  [id number]   cc:   Hewitt Shorts, M.D.  Sanjeev K. Corliss Skains, M.D.  Holley Bouche, M.D.

## 2010-06-20 NOTE — Discharge Summary (Signed)
[name], [name]                ACCOUNT NO.:  [id number]   MEDICAL RECORD NO.:  [id number]          PATIENT TYPE:  IPS   LOCATION:  4033                         FACILITY:  [facility]   PHYSICIAN:  Ellwood Dense, M.D.   DATE OF BIRTH:  [birth year]   DATE OF ADMISSION:  06/09/2007  DATE OF DISCHARGE:                               DISCHARGE SUMMARY   PRIMARY CARE PHYSICIAN:  Noberto Retort, MD   INTERVENTIONAL RADIOLOGIST:  Grandville Silos. Corliss Skains, MD   CRITICAL CARE MEDICINE:  Unknown.   GI MEDICINE:  Anselmo Rod, MD   NEUROSURGEON:  Hewitt Shorts, MD   HISTORY OF PRESENT ILLNESS:  Ms. Colgate is a 77 year old Caucasian  female with history of hypertension and glaucoma.  She was admitted on  May 19, 2007 with severe headache of 2-day duration with decreased  mental status.  She was found to have an extensive subarachnoid  hemorrhage requiring intraventricular catheter placement by Dr.  Newell Coral.  She was intubated the same day for airway protection.   Initially, there were attempts at coiling of the right internal cerebral  artery aneurysm and stenting.  The coiling was unsuccessful but this  stent was placed.  There was no change in the intracranial circulation.  The patient self-extubated on May 26, 2007 that required reintubation  on May 27, 2007 secondary to fever and desaturation.  There was a  positive bronchial alveolar lavage aspirate for H. influenzae with urine  culture positive for Enterobacter.  She was extubated on May 29, 2007.   On June 02, 2007, the patient had repeat angiography with stent  placement.  Coiling was again unsuccessful secondary to vasospasm.  She  extubated without difficulty and remained on Dilantin for seizure  prophylaxis.  A swallow evaluation showed no dysphagia and the patient  is on a regular diet with thin liquids at the present time.   The patient's physicians have recommended Nimotop to maintain blood  pressure between  120 and 150.  The craniotomy is planned for the future  for coiling of the aneurysm.  Therapy has been ongoing and the patient  has short-term deficits with disorientation.  She continues to complain  of headache and dizziness with mobility.  She is able to sit at the edge  of the bed 10 minutes with supervision and able to stand with +2 total-  to-moderate assist with the patient doing 65% for 2 minutes.  She has  had dizziness reported during and near the end of her session.   Followup cranial CT on Jun 06, 2007 showed stable tiny amount of  subarachnoid hemorrhage bilaterally with the intraventricular hemorrhage  not apparently evident.   The patient was evaluated by the rehabilitation physicians and felt to  be an appropriate candidate for inpatient rehabilitation.   REVIEW OF SYSTEMS:  Noncontributory.   PAST MEDICAL HISTORY:  1. Hypertension.  2. Glaucoma.  3. Hypothyroidism.  4. Dyslipidemia.  5. History of left shoulder hemiarthroplasty in 2002.  6. Bilateral lower extremity numbness secondary to neuropathy.  7. History of gastritis/soft ankle open reduction and internal  fixation after fracture/fall, January 2009.  8. Prior hysterectomy.  9. Diverticulosis.  10.Prior hernia repair.   FAMILY HISTORY:  Positive for cancer and questionable aneurysm.   SOCIAL HISTORY:  The patient at the entry.  She had been living with her  daughter for a short time after left ankle fracture and planned to live  with her daughter again for a short-term as necessary at the time of  this discharge.  The patient does not use alcohol or tobacco.  The  family is very supportive and they are all planning a family wedding  dated for Jun 21, 2007 for one of the patient's granddaughters.   FUNCTIONAL HISTORY PRIOR TO ADMISSION:  Very active, helping, taking  care of her granddaughters, and involved in multiple community  activities and independent with bathing and dressing along with  driving.   ALLERGIES:  MORPHINE.   MEDICATIONS PRIOR TO ADMISSION:  1. Vicodin 5/500 rarely used.  2. Lumigan daily.  3. Synthroid 88 mcg daily.  4. Lopressor 50 mg p.o. b.i.d.   LABORATORY:  Recent hemoglobin was 8.8 with hematocrit of 25.9, platelet  count of 590,000, white count of 7.7.  Recent sodium was 135, potassium  3.7, chloride 104, bicarbonate 23, BUN 4, creatinine 0.54, and glucose  of 99.  Liver function tests showed an alkaline phosphatase of 254, AST  of 79, and ALT of 44.  Recent Dilantin level on June 04, 2007 was 7.4.  Recent albumin was 2.4 with gamma GTP of 180.  Recent anemia panel  showed a red blood cells 2.96 with iron of 37, TIBC of 192 with percent  saturation of 19, vitamin B12 of 406, and folate of 11.4 with  reticulocyte count of 2.5%.  Chest x-ray on June 04, 2007 showed  minimal basilar atelectasis.   PHYSICAL EXAMINATION:  GENERAL:  Well-appearing, alert, adult female  seated up in a chair with well-healing cranial wound with staples in  place and partially shaved scalp.  HEENT:  Well-healing right scalp wound with staples in place.  CARDIOVASCULAR:  Regular rate and rhythm, S1-S2 without murmurs.  ABDOMEN:  Soft, nontender with positive bowel sounds.  LUNGS:  Clear to auscultation bilaterally.  NEUROLOGIC:  Alert and oriented x3.  Cranial nerves II-XII were intact.  EXTREMITIES:  Bilateral upper extremity exam showed 4+/5 strength  throughout.  Bulk and tone were normal.  Lower extremity exam showed 4-  /5 strength throughout.  Bulk and tone were normal.  Sensation was  slightly decreased to light touch in the bilateral distal hands and  feet.   DIAGNOSES:  1. Subarachnoid hemorrhage, grade 3 at the circle of Willis secondary      to right internal carotid artery saccular aneurysm.  2. Status post aneurysm stenting with unsuccessful coiling.  3. Planned craniotomy in the future for coiling of the above-noted      aneurysm.   Presently, the  patient has deficits in ADLs, transfers, ambulation, and  higher level cognition related to the above-noted subarachnoid  hemorrhage/internal carotid artery saccular aneurysm.   PLAN:  1. Admit to the rehabilitation unit for daily therapies to include      physical therapy, for range of motion, strengthening, bed mobility,      transfers, pre-gait training, gait training, and equipment      evaluation.  2. Occupational therapy for range of motion, strengthening, ADLs,      cognitive/perceptual training, splinting and equipment evaluation.  3. Rehab nursing for skin care, wound care, and  bowel and bladder      training as necessary.  4. Speech therapy for higher level of cognition and evaluation of      swallow as necessary.  5. Case management to assess home environment, assist with discharge      planning, and arrange for appropriate followup care.  6. Social work to assess family and social support and assist in      discharge planning.  7. Check admission labs including CBC and CMET along with Dilantin      level, Jun 10, 2007.  8. Dulcolax suppository 1 per rectum at bedtime p.r.n.  9. Oxycodone 5 mg 1-2 tablets p.o. q.4 h. p.r.n. pain.  10.Lumigan 0.03% one drop into each eye at bedtime.  11.Enteric-coated aspirin 325 mg p.o. daily.  12.Synthroid 88 mcg p.o. daily.  13.Zocor 80 mg p.o. daily.  14.Lopressor 50 mg p.o. b.i.d. holding for systolic blood pressure      less than 120 or heart rate less than 16.  15.Protonix 40 mg p.o. daily.  16.Dilantin 200 mg p.o. q.a.m. and 300 mg p.o. at bedtime.  17.Discontinue IV fluids and normal saline lock if not already done.  18.Catapres 0.1 mg p.o. q.6 h. p.r.n. for systolic blood pressure      greater than or equal to 160.  19.Iron sulfate 325 mg 1 p.o. b.i.d.  20.Zofran 4 mg-6 mg p.o. q.6 h. p.r.n. nausea.  21.Protein 1 scoop p.o. t.i.d.  22.Resource 1 p.o. t.i.d. with meals.  23.Please assist the patient with meals as needed.   24.Continue Foley to straight drainage.  25.Meclizine 12.5 mg-25 mg p.o. 4 times a day p.r.n. dizziness.  26.Sleep/wake chart.   PROGNOSIS:  Good.   ESTIMATED LENGTH OF STAY:  8-12 days.   GOALS:  Modified independents, ADLs, transfers, and standby assist to  min assist ambulation with modified and long-distance wheelchair  mobility.          ______________________________  Ellwood Dense, M.D.    DC/MEDQ  D:  06/09/2007  T:  06/10/2007  Job:  [id number]

## 2010-06-20 NOTE — Assessment & Plan Note (Signed)
[name] returns to clinic today for followup evaluation.  Overall,  she is doing very well.  She does have some urinary tract symptoms at  the present time and is being followed by her primary care physician for  that problem.  She is on antibiotics at the present time.  They are  considering referral to a urologist if her urinary tract infection does  not clear up.  She does report that she is off the phenytoin and Pepcid  completely.  Dr. Newell Coral had recommended that she discontinued and  slowly wean from the phenytoin.  She does take Zoloft which was  prescribed during the last clinic visit on July 08, 2007, and her family  reports that she definitely has decreased crying spells.  She does take  aspirin along with Synthroid on a daily basis.  She does complain of  fatigue and insomnia but the insomnia apparently is not new.  The  patient uses a single-point cane for ambulation outside the home and  uses the furniture for support inside the home on an occasional basis.   MEDICATIONS:  1. Multivitamins daily.  2. Aspirin 325 mg daily.  3. Synthroid 88 mcg p.o. daily.  4. Metoprolol 50 mg 2 tablets b.i.d.  5. Zoloft 50 mg p.o. daily.   REVIEW OF SYSTEMS:  Positive for diarrhea and limb swelling.   PHYSICAL EXAMINATION:  GENERAL:  Well-appearing, moderately overweight  adult female in mild-to-no acute discomfort.  VITAL SIGNS:  Blood pressure 147/45 with a pulse of 65, respiratory rate  18 and O2 saturation 98% on room air.  She ambulates with a single-point cane but is able to do so without the  cane.  She does complain to have some occasional loss of balance but  seems to be rather stable on her feet in the office today.  She has 4+/5  strength throughout the bilateral upper and lower extremities.   IMPRESSION:  1. Status post subarachnoid hemorrhage, grade 3, secondary to internal      carotid artery saccular aneurysm.  2. Hypertension.  3. Glaucoma.  4. History of  hyperthyroidism.  5. Lethargy.  6. History of bilateral lower extremity neuropathy.   In the office today, we did refill the patient's Zoloft.  We have asked  her to restart Pepcid AC over the counter for her mild dyspepsia.  We  have also asked her to follow up with her primary care physician  regarding urinary tract infection treatment.  We will plan on seeing her  in followup on an as needed basis, and she will be following up with her  primary care physician Dr. Tiburcio Pea.           ______________________________  [name], M.D.     DC/MedQ  D:  11/03/2007 15:02:29  T:  11/04/2007 04:22:41  Job #:  [id number]

## 2010-06-20 NOTE — Discharge Summary (Signed)
NAMEMANHATTAN, MCCUEN                ACCOUNT NO.:  [id number]   MEDICAL RECORD NO.:  [id number]          PATIENT TYPE:  INP   LOCATION:  3108                         FACILITY:  [facility]   PHYSICIAN:  Hewitt Shorts, M.D.DATE OF BIRTH:  1927/03/16   DATE OF ADMISSION:  05/19/2007  DATE OF DISCHARGE:  06/09/2007                               DISCHARGE SUMMARY   ADMISSION HISTORY AND PHYSICAL EXAMINATION:  The patient is an 77-year-  old woman who was transferred from the [facility] to the  Neurosurgerical Intensive Care with a newly diagnosed subarachnoid  hemorrhage.  The patient had a headache a couple of days earlier,  however, was fine the following day, but on the day of presentation  called her daughter complaining of severe headache.  She was found by  her family with decreased responsiveness and gurgling breathing.  EMS  was called and taken to the [facility] Emergency Room where she is  evaluated by Dr. Margarita Grizzle.  She obtained a CT scan which showed  diffuse subarachnoid hemorrhage.  The patient was transferred.  She  apparently vomited at home and in the emergency room and Dr. Rosalia Hammers elected  to intubate her.  She was transferred intubated.   PAST MEDICAL HISTORY:  Notable for hypertension, hypothyroidism, and  glaucoma, and further details of her history included in her admission  note.   PHYSICAL EXAMINATION:  GENERAL:  Well-developed, well-nourished somewhat  obese female, intubated, and on the ventilator.  VITAL SIGNS:  Temperature 96.2, pulse 56, and blood pressure 152/86.  LUNGS:  Lungs had rhonchi in the bases.  She had a sinus bradycardia.  NEUROLOGIC:  She was not opening her eyes to voice or pain.  She was not  following commands.  She localized the pain bilaterally.  Toes were  upgoing bilaterally.   HOSPITAL COURSE:  The patient was admitted.  The Interventional  Radiology Service was consulted and the patient underwent cerebral  arteriogram which showed a  aneurysm involving the right internal carotid  artery due to broad neck diffuse form and shape with large outpouching,  Dr. Corliss Skains and I both felt that an endovascular approach offered a  better opportunity for managing this aneurysm and obliterating it.  Dr.  Corliss Skains requested an intraventricular catheter to be placed prior to  the interventional approach which would require stenting and coiling  behind the stent, we plan on treating patient with Plavix.  Therefore, I  placed a right frontal intraventricular catheter.  The opening pressure  was mildly elevated, but CSF drains well.  Critical Care Medicine was  consulted on the day of admission.  The patient was seen by Dr. Shan Levans in consultation, and the patient continued to be followed  throughout the hospitalization by the Critical Care Medicine Service.   Unfortunately, Dr. Corliss Skains was unable to successfully treat the  aneurysm endovascularly.  He deployed one stent which he explained to me  was deployed distally, but once deployed migrated proximally.  He was  unable to get passed that initial stent to place a more distal stent and  therefore was unable to even attempt coiling the lesion.  He obtained a  CT of the brain which result showed no evidence of new hemorrhage and  which could be compression of the ventricles.   Following day, the patient had extubated herself.  Her intraventricular  catheter was working well.  She was opening her eyes and following  commands.  We elected to place a Pandel.  Following day, she had opened  her eyes to voice, followed commands.  CT showed ventricles were well  decompressed with the intraventricular catheter in good position, but  little clearing of the subarachnoid hemorrhage.  I reviewed her case  with number of physicians and plan was to reattempt a interventional  radiology approach in a few days to a week or so.  The patient did  require reintubation and subsequently was  sedated with Versed and  fentanyl.  IVC continued to function well.  The patient continued to  receive care by both the Neurosurgerical and Critical Care Medicine  Services in the neurosurgerical ICU.  A subsequent CT scans began to  show clearing of the subarachnoid hemorrhage.  TCDs were obtained and  were stable throughout the course of her care for aneurysm subarachnoid  hemorrhage.  The patient was treated with Nimotop for a total of 30  days.  The patient had suspected seizure activity and was loaded with  Dilantin and continued on Dilantin through the hospitalization.  The  patient was supported with tube feedings for nutritional support.  Dr.  Corliss Skains eventually took the patient back for interventional radiology  approach again for endovascular treatment of the aneurysm, two weeks  following her initial subarachnoid hemorrhage on June 02, 2007.  He was  again unable to further stent the segment involved with the aneurysm and  was unable to coil the aneurysm.  The patient was eventually weaned and  extubated again following that anesthetic, and we began to then raise  the height of the intraventricular catheter to wean her from the  catheter.  The drainage from the catheter continued to diminish.  Follow  up CT scan showed progressive clearing of the subarachnoid hemorrhage  and normal ventricular size, and therefore, the catheter which have been  clamped was then removed.  We then obtained physical therapy,  occupational therapy consultations, and eventually a consultation from  the physical medicine rehabilitation service.  The patient stabilized.  It is felt after extensive consultation with the patient's family and  with several of my partners and Dr. Corliss Skains that further endovascular  attempts would be fruitless and is best to allow her to continue to  recuperate from a subarachnoid hemorrhage and they consider definitive  intervention on the aneurysm for surgical approach  at a later date and  so we allowed her to continue to recuperate from her stroke.  Therefore,  she was accepted to the rehabilitation center for comprehensive  inpatient rehabilitation and was transferred on Jun 09, 2007, for  inpatient rehabilitation.  The patient had made steady progress  improving neurologically, becoming awake, fully oriented with good  speech, moving all four extremities well.  Followup CT scan showed  stable ventricular size following removal of the intraventricular  catheter.   DISCHARGE DIAGNOSES:  1. Right internal carotid artery aneurysm.  2. Aneurysmal subarachnoid hemorrhage.      Hewitt Shorts, M.D.  Electronically Signed     RWN/MEDQ  D:  07/16/2007  T:  07/17/2007  Job:  [id number]

## 2010-06-20 NOTE — Discharge Summary (Signed)
[name], KIRCHOFF                ACCOUNT NO.:  [id number]   MEDICAL RECORD NO.:  [id number]          PATIENT TYPE:  OBV   LOCATION:  5511                         FACILITY:  [facility]   PHYSICIAN:  Mearl Latin, PA       DATE OF BIRTH:  07-06-1927   DATE OF ADMISSION:  02/13/2007  DATE OF DISCHARGE:  02/15/2007                               DISCHARGE SUMMARY   DISCHARGE DIAGNOSIS:  Left bimalleolar fracture with subluxation and  fracture of the first proximal phalange of the great toe.   ADDITIONAL DISCHARGE DIAGNOSES:  1. Hypertension.  2. Hypothyroidism.  3. Peripheral neuropathy.   PROCEDURES PERFORMED:  Closed reduction of left ankle bimalleolar  fracture on 02/13/2007.   BRIEF HISTORY AND HOSPITAL COURSE:  Ms. Brahmbhatt is a 77 year old  Caucasian female who is extremely independent.  She states that at about  3:00 on 02/13/2007, she fell and sustained an injury to her left ankle.  She stated that she had some immediate discomfort and pain, but she was  able to get in the car and drive herself home.  Upon getting home, she  had difficulty getting out of the car, and she had some increased  swelling of her left ankle.  It was then determined that the patient  needed to seek medical attention, and she was therefore brought to [facility] emergency department for evaluation.  She was initially seen in the  emergency room by myself, and later on by Dr. Carola Frost.  X-rays obtained in  the emergency department demonstrated a left ankle bimalleolar fracture.  There was also apparent subluxation of the tibial talar joint on the  left side.  X-rays also demonstrated a nondisplaced fracture of the  first proximal phalange of the great toe.  The patient had significant  swelling of the left ankle initially.  Therefore, surgical intervention  was not pursued during this initial hospital visit.  It was determined,  however, that closed reduction of the fracture would be necessary to  restore  alignment and to provide comfort in anticipation for future  surgical intervention.  The patient was admitted to the hospital for  observation  and pain control for 2 days, and she was deemed to be  stable enough on 02/15/2007, at which time she was discharged to home  with home health care set up to assist the patient in this interim  period while waiting for definitive correction.  The patient did not  have a complicated hospital course, and she was stable throughout.   LABORATORY DATA:  Pertinent labs and diagnostic testing:  No labs were  obtained during this hospital visit.  Post reduction radiographs  demonstrate improved alignment of the left ankle bimalleolar fracture  subluxation.   DISCHARGE MEDICATIONS:  Percocet 5/325 at 1 to 2 q.4 to 6 hours as  needed for pain, oxycodone 5 mg 1 to 2 q.3 hours in between Percocet  doses for breakthrough pain, Robaxin 500 mg 1 q.6 hours as needed,  Lovenox 40 mg subcutaneously daily.  The patient may also resume her  home medications, which include  Lasix, Cymbalta, metoprolol, and  levothyroxine.   DISCHARGE INSTRUCTIONS:  Ms. Stoklosa is to continue to maintain non-  weightbearing on her left lower extremity, and the splint is to remain  in place until her followup visit.  Ms. Elenbaas will follow up in 1 week  at the office for further evaluation.  At this time, we will further  assess the soft tissue to determine if it is healed sufficiently to  allow for surgical intervention.  Ms. Plato activities are not  restricted, so long as she can maintain non-weightbearing on her left  lower extremity with the use of assistive devices, such  as a walker  and/or crutches.  We also will initiate Lovenox therapy 40 mg  subcutaneously for at least 20 days for DVT prophylaxis, given the  patient's non-weightbearing status and suspected limited mobility for  this acute phase.  We do anticipate that once either surgery or  conservative management is  decided upon, the patient will remain non-  weightbearing for 6 to 8 weeks, at which time she will then slowly  progress to weight bearing as tolerated.  With regards to the left  proximal phalange fracture of the great toe, we will pursue nonoperative  management, as the fracture is nondisplaced and is in good alignment,  and does not demonstrate any intra-articular fragments or extension.  Again, we are grateful for the opportunity to provide care for Ms.  Shisler, and we look forward to providing her care for this injury.      Mearl Latin, PA     KWP/MEDQ  D:  02/17/2007  T:  02/18/2007  Job:  [id number]

## 2010-06-20 NOTE — Op Note (Signed)
[name], [name]                ACCOUNT NO.:  [id number]   MEDICAL RECORD NO.:  [id number]          PATIENT TYPE:  INP   LOCATION:  3108                         FACILITY:  [facility]   PHYSICIAN:  Hewitt Shorts, M.D.DATE OF BIRTH:  06-Apr-1927   DATE OF PROCEDURE:  05/19/2007  DATE OF DISCHARGE:                               OPERATIVE REPORT   PREOPERATIVE DIAGNOSIS:  Subarachnoid hemorrhage.   POSTOPERATIVE DIAGNOSIS:  Subarachnoid hemorrhage.   PROCEDURE:  Placement of right frontal intraventricular catheter.   SURGEON:  Hewitt Shorts, MD   ANESTHESIA:  General anesthetic and 1% Xylocaine local anesthetic.   INDICATION:  The patient is a 77 year old woman who presented with an  acute subarachnoid hemorrhage.  Cerebral angiogram showed a wide-necked  fusiform, right internal carotid artery aneurysm, which will require  endovascularly a combination of stenting and coiling and that will  require the use of Plavix and Dr. Corliss Skains has requested an  intraventricular catheter be placed in advance of the endovascular  procedure to reduce the risk of subsequent intraventricular catheter  placement.   PROCEDURE:  In the angiogram suite, the right frontal scalp was shaved,  prepped with Betadine solution, and draped in sterile fashion.  A 1%  Xylocaine was injected in the midpupillary line over the right frontal  scalp.  A 4-mm incision was made in the right frontal scalp in the  midpupillary line.  A twist drill hole was made in the skull and the  dura punctured.  We were then able to pass a ventricular catheter into  the right frontal horn.  Good CSF flow was seen.  The opening pressure  was elevated to the length of the tubing.  We will tunnel the tubing  subcutaneously.  It was brought up through a separate stab incision.  The primary incision was closed with 2 interrupted 3-0 nylon sutures and  then the catheter itself was secured to the scalp with 3-0 nylon suture  at 3  points including its exit point and was subsequently connected to a  closed collection system, which was set to drain at 10 cm of water.  The  CSF was initially clear, but then thin tinged and good CSF flow was  noted.  Procedure is tolerated well.   ESTIMATED BLOOD LOSS:  Less than 5 mL.      Hewitt Shorts, M.D.  Electronically Signed     RWN/MEDQ  D:  05/19/2007  T:  05/20/2007  Job:  [id number]

## 2010-06-20 NOTE — H&P (Signed)
[name], [name]                ACCOUNT NO.:  [id number]   MEDICAL RECORD NO.:  [id number]          PATIENT TYPE:  INP   LOCATION:  3108                         FACILITY:  [facility]   PHYSICIAN:  Hewitt Shorts, M.D.DATE OF BIRTH:  January 09, 1928   DATE OF ADMISSION:  05/19/2007  DATE OF DISCHARGE:                              HISTORY & PHYSICAL   HISTORY OF PRESENT ILLNESS:  The patient is a 77 year old right-handed  white female, who was transferred from [facility] Emergency Room to the  Neurosurgical Intensive Care Unit because of a newly diagnosed  subarachnoid hemorrhage.  The patient herself has had decreased level of  responsiveness and therefore the history was obtained from the patient's  family.   They report that she called her daughter this morning about 6:30,  complaining of severe headache and stiff neck.  Her daughter went over  to the patient's home and found her sitting up in a chair, but not  responding, drool coming from the right corner of her mouth, gurgling  breathing.  She apparently had vomited at home.  EMS was called, and the  patient was taken by EMS to the [facility] Emergency where she was  evaluated by the emergency room physician Dr. Margarita Grizzle.   Another of her daughters mentioned that a granddaughter had called the  patient on Saturday night around midnight and the patient was  complaining of headache then; however, the family saw her yesterday and  the patient seemed well and was without any complaints.   Dr. Rosalia Hammers evaluated the patient.  CT of the brain without contrast was  obtained.  It revealed diffuse subarachnoid hemorrhage and neurosurgical  transfer was requested, and the patient was accepted to the  Neurosurgical Intensive Care Unit.   The patient did vomit again apparently in the [facility] Emergency  Room.  Dr. Rosalia Hammers felt that because of the decreased responsiveness and  repeated vomiting that it would be best to protect her airway  by  intubation.  The patient was intubated by Dr. Rosalia Hammers and given a number of  medications, including morphine, Ativan, and Versed for the intubation.  She was given succinylcholine.   PAST MEDICAL HISTORY:  Notable for history of  hypertension,  hypothyroidism, and glaucoma, all of which are treated.  She is followed  by Dr. Rosalia Hammers and Dr. Tiburcio Pea from [facility].   PAST SURGICAL HISTORY:  Previous surgeries include left ankle ORIF for  fracture in January 2009 by Dr. Carola Frost, left shoulder replacement 7 years  ago by Dr. Sherlean Foot, right total hip replacement 15 years ago by Dr.  Meade Maw.  Hysterectomy, cholecystectomy and cardiac catheterization 20  years ago.   ALLERGIES:  Currently, has no allergies to medications.   MEDICATIONS:  Medications at this time include,  1. Lopressor 50 mg b.i.d.  2. Synthroid 88 mcg daily.  3. Lumigan ophthalmologic drops 1 drop in each eye nightly.  4. Vicodin p.r.n. pain.   FAMILY HISTORY:  Mother died at age 60.  She has no medical information  regarding the father.   SOCIAL  HISTORY:  Socially, the patient lives alone.  Following her ankle  surgery, she had been staying with her daughter but had just moved home  about 8 days ago.  She does not smoke or drink alcohol beverages.   REVIEW OF SYSTEMS:  Unobtainable due to the patient's depressed level of  responsiveness.   PHYSICAL EXAMINATION:  GENERAL:  Examination today showed well-  developed, well-nourished white female intubated on a ventilator.  VITAL SIGNS:  Temperature 96.2, pulse 56, blood pressure 120/86.  LUNGS:  Lungs have symmetrical respiratory excursion but rhonchi in the  bases.  HEART:  Heart has sinus bradycardia, normal S1, S2.  There is no murmur.  ABDOMEN:  Abdomen is soft, nontender, nondistended, bowel sounds are  present.  EXTREMITIES:  No clubbing, cyanosis, or edema.  NEUROLOGIC:  The patient does not open her eyes to voice or pain.  She  does not follow  commands.  Pupils were 2 mm, bilaterally, equal, round,  and reactive to light.  Corneals are present bilaterally.  She localizes  to pain bilaterally.  Reflexes are absent in the upper and lower  extremities, but toes are upgoing bilaterally.  Gait and stance are not  tested due to altered mental status.   IMPRESSION:  Subarachnoid hemorrhage, rule out cerebral aneurysm in a  patient with a history of hypertension, hyperthyroidism, and glaucoma.  The patient currently intubated on a ventilator.   PLAN:  The patient be admitted.  Admission workup will be performed.  Will proceed with cerebral arteriography and decide on further treatment  and care pending those results.  I have had an opportunity to speak to  the patient's daughters (all three were present) as well as other family  members including a granddaughter who works here in [facility]  as a physical therapist.  We discussed the nature of subarachnoid  hemorrhages, the likelihood of it being a cerebral aneurysm and risks  including risk of re-hemorrhage, vasospasm and hydrocephalous and the  precarious and critical nature of her condition.  Their questions were  answered.      Hewitt Shorts, M.D.  Electronically Signed     RWN/MEDQ  D:  05/19/2007  T:  05/20/2007  Job:  [id number]   cc:   Holley Bouche, M.D.

## 2010-06-23 NOTE — Op Note (Signed)
   [name], [name]                          ACCOUNT NO.:  [id number]   MEDICAL RECORD NO.:  [id number]                   PATIENT TYPE:  OIB   LOCATION:  5733                                 FACILITY:  [facility]   PHYSICIAN:  Danise Edge, M.D.                DATE OF BIRTH:  [birth year]   DATE OF PROCEDURE:  12/12/2001  DATE OF DISCHARGE:                                 OPERATIVE REPORT   PROCEDURE:  Unsuccessful endoscopic retrograde cholangiopancreatography.   INDICATIONS:  The patient is a 77 year old female born [birth year].  On 12/11/01,  she underwent a laparoscopic cholecystectomy for gallstones.  An operative  cholangiogram revealed a small filling defect in the common bile duct, which  could represent a stone or air bubble.   I discussed with the patient the complications associated with ERCP with  endoscopic sphincterotomy to remove common bile duct stones, including  pancreatitis, bleeding, cholangitis, and intestinal perforation.  The  patient has signed the operative permit.   ENDOSCOPIST:  Danise Edge, M.D.   PREMEDICATION:  Versed 7.5 mg, Demerol 50 mg.   ENDOSCOPE:  Olympus therapeutic duodenoscope.   DESCRIPTION OF PROCEDURE:  After obtaining informed consent, the patient was  placed in the prone position on the fluoroscopy table.  I administered  intravenous Demerol and intravenous Versed to achieve conscious sedation for  the procedure.  The patient's blood pressure, oxygen saturation, and cardiac  rhythm were monitored throughout the procedure and documented in the medical  record.   The Olympus therapeutic duodenoscope was passed through the posterior  hypopharynx, down the esophagus, into the proximal stomach without  difficulty.  The esophagus was not examined.  The pylorus was intubated  without examining the stomach and the endoscope advanced to the second  portion of the duodenum without examining the duodenal bulb.   The major papilla appears normal  endoscopically.  There is clear straw-  colored bile flowing through the major papilla.  Using the triple-lumen  sphincterotome with wire and tapered sphincterotome, I was unable to obtain  a cholangiogram or pancreatogram.  I was unable to freely cannulate the  common bile duct.    ASSESSMENT:  Unsuccessful endoscopic retrograde cholangiopancreatography.   PLAN:  Schedule magnetic resonance cholangiogram.                                               Danise Edge, M.D.    MJ/MEDQ  D:  12/12/2001  T:  12/12/2001  Job:  [id number]   cc:   Angelia Mould. Derrell Lolling, M.D.  [address]  [place]  [place] [address]  Fax: [phone]

## 2010-06-23 NOTE — Op Note (Signed)
   [name], [name]                          ACCOUNT NO.:  [id number]   MEDICAL RECORD NO.:  [id number]                   PATIENT TYPE:  AMB   LOCATION:  ENDO                                 FACILITY:  [facility]   PHYSICIAN:  Anselmo Rod, M.D.               DATE OF BIRTH:  1927-12-14   DATE OF PROCEDURE:  10/26/2002  DATE OF DISCHARGE:  10/26/2002                                 OPERATIVE REPORT   PROCEDURE:  Colonoscopy with biopsies.   ENDOSCOPIST:  Anselmo Rod, M.D.   INSTRUMENT USED:  Olympus video colonoscope.   INDICATIONS FOR PROCEDURE:  Abdominal pain with rectal bleeding and change  in bowel habits in a 77 year old white female rule out colonic polyps,  masses, etc.   PREPROCEDURE PREPARATION:  Informed consent was obtained from the patient  and the patient was  fasted for 8 hours prior to  the procedure and prepped  with a bottle of magnesium citrate and a gallon of GoLYTELY the  night prior  to the procedure.   PREPROCEDURE PHYSICAL:  The patient had stable vital signs. Neck supple.  Chest clear to auscultation, S1, S2 regular. Abdomen soft with normoactive  bowel sounds.   DESCRIPTION OF PROCEDURE:  The patient was placed in the left lateral  decubitus position and sedated with 50 mg of Demerol and 5 mg of Versed  intravenously. Once sedation was adequate, the patient was maintained on low  flow oxygen and continuous cardiac monitoring.   The Olympus video colonoscope was advanced from the rectum to the cecum  without difficulty. The patient had a fairly prep. Sigmoid diverticulosis  was noted with small  internal hemorrhoids on retroflexion. The rest of the  colonic mucosa up to the cecum  appeared  normal. Random colon biopsies were  done to rule out colitis versus microscopic colitis. The patient tolerated  the procedure well without complications. The appendiceal orifice and  ileocecal valve were clearly visualized and photographed.   IMPRESSION:  1. Small nonbleeding internal hemorrhoids.  2. Sigmoid diverticulosis.  3. Normal appearing transverse colon, right colon and cecum. Random biopsies     done to rule out colitis versus microscopic colitis.    RECOMMENDATIONS:  1. Await pathology results.  2. Upper GI with small bowel follow through to evaluate the rest of the gut.  3. Outpatient follow up thereafter.                                               Anselmo Rod, M.D.    JNM/MEDQ  D:  10/30/2002  T:  10/31/2002  Job:  [id number]   cc:   Holley Bouche, M.D.  [address]  [place], [place] [address]  Fax: [phone]

## 2010-06-23 NOTE — Op Note (Signed)
NAME:  [name], [name]                          ACCOUNT NO.:  [id number]   MEDICAL RECORD NO.:  [id number]                   PATIENT TYPE:  OIB   LOCATION:  NA                                   FACILITY:  [facility]   PHYSICIAN:  Angelia Mould. Derrell Lolling, M.D.             DATE OF BIRTH:  06-07-1927   DATE OF PROCEDURE:  12/11/2001  DATE OF DISCHARGE:                                 OPERATIVE REPORT   PREOPERATIVE DIAGNOSIS:  Chronic cholecystitis with cholelithiasis.   POSTOPERATIVE DIAGNOSES:  1. Chronic cholecystitis with cholelithiasis.  2. Possible, but not definite, choledocholithiasis.   PROCEDURE:  Laparoscopic cholecystectomy with intraoperative cholangiogram.   SURGEON:  Angelia Mould. Derrell Lolling, M.D.   ASSISTANT:  Jimmye Norman, M.D.   ESTIMATED BLOOD LOSS:  About 10 cc.   COMPLICATIONS:  None.   OPERATIVE INDICATION:  This is a 77 year old white female who has recurring  episodes of midepigastric pain, right upper quadrant pain, and left upper  quadrant pain.  She has not had any fever or vomiting.  Ultrasound shows  multiple gallstones.  Hepatobiliary scan is markedly abnormal with slow  filling of the gallbladder and only 6% ejection fraction.  Liver function  tests are normal.  She ws brought to the operating room electively for  cholecystectomy.   OPERATIVE FINDINGS:  The gallbladder was chronically inflamed but was thin-  walled.  There was a tiny stone in the cystic duct and the cystic duct was  very tiny.  The anatomy of the cystic duct and cystic artery and common bile  duct were conventional.  The cholangiogram suggested the possibility of a  tiny stone in the upper portion of the common bile duct below the cystic  duct.  This was present on repeat films.  Otherwise the biliary tract  anatomy was normal and there was good flow of contrast into the duodenum.  The liver, stomach, duodenum, small intestine, and large intestine looked  grossly normal to inspection.   DESCRIPTION OF PROCEDURE:  Following the induction of general endotracheal  anesthesia, the patient's abdomen was prepped and draped in a sterile  fashion.  Marcaine 0.5% with epinephrine was used as a local infiltration  anesthetic.  A transverse incision was made at the lower rim of the  umbilicus.  The fascia was incised in the midline and the abdominal cavity  entered under direct vision.  A 10 mm Hasson trocar was inserted and secured  with a pursestring suture of 0 Vicryl.  Pneumoperitoneum was created.  The  video camera was inserted with visualization and findings as described  above.  A 10 mm trocar was placed in the subxiphoid region and two 5 mm  trocars placed in the right midabdomen.  The gallbladder was placed on  traction.  We dissected out the cystic duct and the cystic artery, creating  a nice window behind the cystic duct.  We isolated the cystic  artery as it  went onto the gallbladder wall, secured it with metal clips, and divided it.  The cystic duct was secured with a metal clip close to the gallbladder.  A  cholangiogram catheter was inserted into the cystic duct and a cholangiogram  was obtained using the C-arm.  As mentioned above, intrahepatic and  extrahepatic biliary anatomy was normal and there was no obstruction, but  there was the suggestion of a small 3-4 mm filling defect in the mid-common  bile duct, which moved slightly but did not seem to flush as easily as an  air bubble would.  Given her normal liver function tests, we felt that  postoperative GI consultation regarding the prudence of an ERCP would be  favorable to common bile duct exploration.   We removed the cholangiogram catheter.  The cystic duct was secured with  metal clips and divided.  The gallbladder was dissected from its bed with  electrocautery, placed in the specimen bag, and removed through the  umbilical port.  The operative field was copiously irrigated with 1 L of  saline.  All the  irrigation fluid returned clear.  There was no bleeding and  no bile leak whatsoever.  The clips were inspected.  The trocars were  removed under direct vision, and there was no bleeding from the trocar  sites.  Pneumoperitoneum was released.  The fascia at the umbilicus was  closed with 0 Vicryl sutures.  The skin incision was closed with  subcuticular sutures of 4-0 Vicryl and Steri-Strips.  Clean bandages were  placed, and the patient was taken to the recovery room in stable condition.  Sponge, needle, and instrument counts were correct.                                               Angelia Mould. Derrell Lolling, M.D.    HMI/MEDQ  D:  12/11/2001  T:  12/11/2001  Job:  [id number]   cc:   Luvenia Redden, M.D.  [address]  [place]  [place]  [address]  Fax: [phone]   L. Lupe Carney, M.D.

## 2010-06-23 NOTE — Op Note (Signed)
[place]  Patient:    [name], PFARR Visit Number: [id number] MRN: [id number]          Service Type: DSU Location: 5000 [id number] Attending Physician:  Georgena Spurling Dictated by:   Georgena Spurling, M.D. Proc. Date: 11/11/00 Admit Date:  11/11/2000                             Operative Report  PREOPERATIVE DIAGNOSIS:  Left shoulder proximal humerus fracture, three-part.  POSTOPERATIVE DIAGNOSIS:  Left shoulder proximal humerus fracture, three-part.  PROCEDURE:  Left shoulder hemiarthroplasty and tuberosity fixation.  ANESTHESIA:  General.  SURGEON:  Georgena Spurling, M.D.  ASSISTANT:  Jamelle Rushing, P.A.  INDICATION FOR PROCEDURE:  The patient is a 77 year old a couple of weeks status post proximal humerus fracture showing impaction and retroversion of the head and displacement of the tuberosity.  Informed consent was obtained.  DESCRIPTION OF PROCEDURE:  The left shoulder was prepped and draped in the usual sterile fashion.  A straight incision was made from the coracoid down to the lateral insertion of the deltoid.  We created soft tissue flaps, found the cephalic vein, created the deltopectoral interview.  We placed a _____ retractor, retracting the anterior and lateral deltoid, and then found the medial edge of the coracobrachialis, developed that plane, and placed the _____ behind this, then took our subscapularis off sharply from the rotator cuff interval down to the inferior aspect of the subscapularis.  We tagged these with interrupted #2 Ethibond sutures.  We then went toward the back of the humerus, and the infraspinatus had been ruptured off with flakes of bone. We grabbed those with modified Mason-Allen sutures behind the bony debris and in the tendinous portion.  We then noticed that the head had been impacted at least 1 cm and retroverted approximately 30 more degrees posteriorly toward retroversion.  We then externally  rotated the arm, delivered it up out of the head, again appreciated the amount of retroversion that the fracture had created.  We attempted to lever the head into the native position and then gain access into the canal and ream to a size 11.  We then kept the 11 reamer down, used our cutting guide to ensure proper retroversion, and at this point made our sagittal cut.  Once we had done this, we had left a piece.  We had to "shish kebab" to bring back down onto the shaft of the humerus, and we used our real prosthesis.  We then tamped it down to the cut surface in that degree of retroversion, and we did shish kebab this fragment.  It was very stable and gave [place] proper head height with a good calcar.  At this point we ran two [place]il[place] tapes with sutures through the holes of the prosthesis, and we grasped our infraspinatus posteriorly and subscapularis anteriorly and tied these down over surgeons knots.  We then did a rotator cuff repair through the length of our supraspinatus that we had incised.  We then packed bone graft all around where we could and lavaged.  We then closed the deltoid interval with interrupted 0 Vicryl, deep soft tissues with interrupted 0 Vicryl, subcuticular 2-0 Vicryl, skin staples, and dressed with Adaptic, 4 x 4s, ABDs, and tape.  We placed a sling and swath and then extubated the patient, took her to the recovery room in stable condition.  COMPLICATIONS:  None.  DRAINS:  None.  ESTIMATED BLOOD LOSS:  Approximately 300 cc. Dictated by:   Georgena Spurling, M.D. Attending Physician:  Georgena Spurling DD:  11/11/00 TD:  11/12/00 Job: [id number]

## 2010-06-23 NOTE — Op Note (Signed)
[name], [name]                ACCOUNT NO.:  [id number]   MEDICAL RECORD NO.:  [id number]          PATIENT TYPE:  AMB   LOCATION:  ENDO                         FACILITY:  [facility]   PHYSICIAN:  Anselmo Rod, M.D.  DATE OF BIRTH:  1927-10-17   DATE OF PROCEDURE:  06/14/2004  DATE OF DISCHARGE:                                 OPERATIVE REPORT   PROCEDURES PERFORMED:  Esophagogastroduodenoscopy with antral biopsies.   ENDOSCOPIST:  Anselmo Rod, M.D.   INSTRUMENT USED:  Olympus video panendoscope.   INDICATION FOR PROCEDURE:  A 77 year old white female with a history of  epigastric pain and nausea associated with black stools.  Rule out peptic  ulcer disease, esophagitis, gastritis, etc.  The patient also takes 600 mg  of ibuprofen at night on a daily basis.   PREPROCEDURE PREPARATION:  Informed consent was procured from the patient.  The patient fasted for eight hours prior to the procedure.   PREPROCEDURE PHYSICAL EXAMINATION:  VITAL SIGNS:  Stable.  NECK:  Supple.  CHEST:  Clear to auscultation.  HEART:  S1 and S2 regular.  ABDOMEN:  Soft with normal bowel sounds.   DESCRIPTION OF PROCEDURE:  The patient was placed in the left lateral  decubitus position.  Sedated with 50 mg of Demerol and 5 mg of Versed in  slow incremental doses. Once the patient was adequately sedated and  maintained on low-flow oxygen and continuous cardiac monitoring, the Olympus  video panendoscope was advanced over the mouthpiece, over the tongue and  into the esophagus under direct vision.  There was evidence of mild distal  esophagitis (grade 1).  A small hiatal hernia was seen on high retroflexion.  Diffuse gastritis was noted.  Antral biopsies were done to rule out the  presence of Helicobacter pylori by pathology.  The proximal small bowel  appeared normal.  The patient tolerated the procedure well without  complications.   IMPRESSION:  1.  Grade 1 distal esophagitis.  2.  Small hiatal  hernia.  3.  Diffuse gastritis.  4.  Antral biopsies done for Helicobacter pylori by pathology.  5.  Normal proximal small bowel.   RECOMMENDATIONS:  1.  Continue PPIs.  2.  Treat with antibiotics if Helicobacter pylori present.  3.  Avoid nonsteroidals.  4.  Outpatient followup in the next two weeks or earlier if need be.      JNM/MEDQ  D:  06/14/2004  T:  06/14/2004  Job:  [id number]   cc:   Melida Quitter, M.D.  [address]  [place]  [place] [address]  Fax: [phone]

## 2010-06-23 NOTE — Discharge Summary (Signed)
[place]  Patient:    [name], [name] Visit Number: [id number] MRN: [id number]          Service Type: MED Location: 5000 [id number] Attending Physician:  Georgena Spurling Dictated by:   Jamelle Rushing, P.A.-C. Admit Date:  11/11/2000 Discharge Date: 11/13/2000                             Discharge Summary  DATE OF BIRTH:  [birth year]  ADMISSION DIAGNOSES: 1. Four part fracture with comminution of proximal left humerus. 2. Hypertension.  DISCHARGE DIAGNOSES: 1. Left shoulder hemiarthroplasty. 2. Hypertension.  HISTORY OF PRESENT ILLNESS:  The patient is a 77 year old white female who approximately two weeks prior to admission into the hospital was working in her house when she tripped over a vacuum, landing on her left shoulder.  The patient went to her primary orthopedist office for evaluation.  It was treated conservatively with just sling and observation, but a second opinion was also requested by the orthopedist for further treatment, and the patient was sent to Dr. Sherlean Foot for evaluation.  Dr. Sherlean Foot felt that a hemiarthroplasty would improve the patients comfort level and range of motion, so the patient agreed, and a total shoulder arthroplasty was planned.  DRUG ALLERGIES:  No known drug allergies.  CURRENT MEDICATIONS:  Premarin 0.9 mg p.o. q.d., Synthroid 0.1 mg p.o. q.d., Maxzide 50/75 one-half tablet p.o. q.d.  SURGICAL PROCEDURE:  On November 11, 2000, the patient was taken to the OR by Dr. Georgena Spurling, assisted by Jamelle Rushing, P.A.-C under general anesthesia.  The patient was taken to the OR for a planned left shoulder total arthroplasty, but intraoperatively, it was found that only a hemiarthroplasty with tuberosity fixation was needed.  This procedure was completed.  The patient tolerated it well.  There were no complications.  No drains left in place, and the patient was transferred to the recovery room and then back  to the orthopedic floor for postoperative recovery.  HOSPITAL COURSE:  On November 11, 2000, the patient was admitted to the [facility] under the care of Dr. Georgena Spurling.  The patient was taken to the OR where a left shoulder hemiarthroplasty was performed under general anesthesia and the patient tolerated this procedure well.  The patient was then transferred to the recovery room and then to the orthopedic floor in good condition for routine postoperative care.  The patient then incurred a two day postoperative care on the orthopedic floor.  The first postoperative day, the patient was awake, but groggy.  She had a significant amount of pain with the use of a PCA, so it was felt that transferring over to OxyContin CR and Percocet was probably better coverage and this was done.  The patients dressing was intact.  Her arm was _______ intact, and her vital signs were all within normal limits.  Due to the patients continued pain and actual nausea throughout the night, she was kept to postoperative day #2, and when she was feeling much better, nausea improved, the pain was much better, her left shoulder was still intact, and overall was ________ intact, and she felt that she improved to the point where she was looking forward to going home.  It was felt from the orthopedic standpoint the patient was stable and she could be discharged to home on this date.  LABORATORY DATA:  Urinalysis on admission was all normal.  CBC on admission was within normal limits except for platelets at 411.  Routine chemistries on admission was all normal.  Chest x-ray on admission showed no acute cardiopulmonary disease and a fracture dislocation of the humeral head on the left, and an EKG on admission was normal sinus rhythm with sinus arrhythmia, normal EKG.  MEDICATIONS ON DISCHARGE:  Premarin 0.9 mg p.o. q.d., Synthroid 100 mcg p.o. q.d., Maxzide 25 mg p.o. q.d., OxyContin CR 10 mg p.o. q.12h.,  Percocet one to two tablets every four to six hours p.r.n. pain, Phenergan 12.5 mg IM/IV q.6h. p.r.n., Tylenol 650 mg p.o. q.d., Robaxin 500 mg p.o. q.6h. p.r.n., Ambien 10 mg p.o. q.8h. p.r.n.  DISCHARGE INSTRUCTIONS:  MEDICATIONS:  The patient is to continue routine home medications.  OxyContin CR 10 mg one tablet every 12 hours, Percocet 5 mg one or two tablets every four to six hours for breakthrough pain as needed, Phenergan 25 mg tablets one tablet every six hours for nausea as needed.  ACTIVITY:  As tolerated, keep left arm in sling except for when bathing.  DIET:  No restrictions.  WOUND CARE:  Keep dressing on, clean and dry.  SPECIAL INSTRUCTIONS:  If temperature increases, severe pain, swelling, or discharge from wound, call physicians office.  FOLLOWUP:  Call for a follow up appointment on the following Tuesday with Dr. Sherlean Foot.  CONDITION ON DISCHARGE TO HOME:  Listed as improved and good. Dictated by:   Jamelle Rushing, P.A.-C. Attending Physician:  Georgena Spurling DD:  11/13/00 TD:  11/13/00 Job: [id number]

## 2010-09-23 ENCOUNTER — Emergency Department (HOSPITAL_COMMUNITY)
Admission: EM | Admit: 2010-09-23 | Discharge: 2010-09-23 | Disposition: A | Discharge status: Home / Self Care | Payer: Medicare Other | Attending: Emergency Medicine | Admitting: Emergency Medicine

## 2010-09-23 DIAGNOSIS — R6889 Other general symptoms and signs: Secondary | ICD-10-CM | POA: Insufficient documentation

## 2010-09-23 DIAGNOSIS — E039 Hypothyroidism, unspecified: Secondary | ICD-10-CM | POA: Insufficient documentation

## 2010-09-23 DIAGNOSIS — R0989 Other specified symptoms and signs involving the circulatory and respiratory systems: Secondary | ICD-10-CM | POA: Insufficient documentation

## 2010-09-23 DIAGNOSIS — L2989 Other pruritus: Secondary | ICD-10-CM | POA: Insufficient documentation

## 2010-09-23 DIAGNOSIS — L298 Other pruritus: Secondary | ICD-10-CM | POA: Insufficient documentation

## 2010-09-23 DIAGNOSIS — R0609 Other forms of dyspnea: Secondary | ICD-10-CM | POA: Insufficient documentation

## 2010-09-23 DIAGNOSIS — I10 Essential (primary) hypertension: Secondary | ICD-10-CM | POA: Insufficient documentation

## 2010-09-23 LAB — POCT I-STAT, CHEM 8
BUN: 22 mg/dL (ref 6–23)
Calcium, Ion: 1.13 mmol/L (ref 1.12–1.32)
Creatinine, Ser: 1.2 mg/dL — ABNORMAL HIGH (ref 0.50–1.10)
TCO2: 25 mmol/L (ref 0–100)

## 2010-10-26 LAB — BASIC METABOLIC PANEL
BUN: 12
CO2: 28
Calcium: 9.6
Creatinine, Ser: 0.74
GFR calc Af Amer: >60
Glucose, Bld: 108 — ABNORMAL HIGH

## 2010-10-26 LAB — CBC
MCHC: 33.4
RDW: 14.3

## 2010-10-26 LAB — DIFFERENTIAL
Basophils Absolute: 0.1
Basophils Relative: 1
Eosinophils Relative: 4
Monocytes Absolute: 0.6

## 2010-10-31 LAB — BLOOD GAS, ARTERIAL
Acid-Base Excess: 0.8
Acid-base deficit: 3 — ABNORMAL HIGH
Acid-base deficit: 3.2 — ABNORMAL HIGH
Acid-base deficit: 3.2 — ABNORMAL HIGH
Acid-base deficit: 3.6 — ABNORMAL HIGH
Acid-base deficit: 3.6 — ABNORMAL HIGH
Acid-base deficit: 0.8
Acid-base deficit: 0.9
Acid-base deficit: 2.4 — ABNORMAL HIGH
Acid-base deficit: 3.5 — ABNORMAL HIGH
Bicarbonate: 19.5 — ABNORMAL LOW
Bicarbonate: 19.7 — ABNORMAL LOW
Bicarbonate: 19.9 — ABNORMAL LOW
Bicarbonate: 20.3
Bicarbonate: 21.9
Bicarbonate: 20.1
Bicarbonate: 22
Bicarbonate: 22.5
Bicarbonate: 23
Bicarbonate: 25 — ABNORMAL HIGH
Drawn by: 129711
Drawn by: 27733
Drawn by: 245492
Drawn by: 270221
FIO2: 0.35
FIO2: 0.35
FIO2: 0.35
FIO2: 0.35
FIO2: 0.5
FIO2: 0.4
FIO2: 0.4
FIO2: 0.4
FIO2: 0.4
MECHVT: 550
MECHVT: 550
MECHVT: 600
MECHVT: 500
MECHVT: 550
MECHVT: 750
O2 Content: 2.5
O2 Saturation: 94.7
O2 Saturation: 96.5
O2 Saturation: 96.9
O2 Saturation: 97.1
O2 Saturation: 98.7
O2 Saturation: 99
O2 Saturation: 94.4
O2 Saturation: 97.1
O2 Saturation: 97.5
O2 Saturation: 98.4
O2 Saturation: 98.8
PEEP: 5
PEEP: 5
PEEP: 5
PEEP: 5
PEEP: 5
PEEP: 5
PEEP: 5
PEEP: 5
Patient temperature: 98.6
Patient temperature: 98.6
Patient temperature: 98.6
Patient temperature: 98.6
Patient temperature: 98.6
Patient temperature: 98.6
Patient temperature: 100.6
Patient temperature: 98.6
Patient temperature: 98.6
Patient temperature: 98.6
Patient temperature: 99.1
RATE: 12
RATE: 12
RATE: 12
RATE: 12
RATE: 12
RATE: 12
RATE: 14
RATE: 16
TCO2: 20.4
TCO2: 20.6
TCO2: 20.8
TCO2: 21.3
TCO2: 23.2
TCO2: 21
TCO2: 23.1
TCO2: 23.5
TCO2: 24.1
TCO2: 26.2
pCO2 arterial: 28.2 — ABNORMAL LOW
pCO2 arterial: 28.7 — ABNORMAL LOW
pCO2 arterial: 30.8 — ABNORMAL LOW
pCO2 arterial: 42.6
pCO2 arterial: 30.7 — ABNORMAL LOW
pCO2 arterial: 33.9 — ABNORMAL LOW
pCO2 arterial: 36.8
pCO2 arterial: 38.3
pCO2 arterial: 39.9
pCO2 arterial: 40.7
pH, Arterial: 7.332 — ABNORMAL LOW
pH, Arterial: 7.392
pH, Arterial: 7.435 — ABNORMAL HIGH
pH, Arterial: 7.451 — ABNORMAL HIGH
pH, Arterial: 7.464 — ABNORMAL HIGH
pH, Arterial: 7.377
pH, Arterial: 7.405 — ABNORMAL HIGH
pH, Arterial: 7.415 — ABNORMAL HIGH
pH, Arterial: 7.431 — ABNORMAL HIGH
pH, Arterial: 7.444 — ABNORMAL HIGH
pO2, Arterial: 152 — ABNORMAL HIGH
pO2, Arterial: 78 — ABNORMAL LOW
pO2, Arterial: 81.8
pO2, Arterial: 87.1
pO2, Arterial: 112 — ABNORMAL HIGH
pO2, Arterial: 134 — ABNORMAL HIGH
pO2, Arterial: 67.8 — ABNORMAL LOW
pO2, Arterial: 91
pO2, Arterial: 95.1
pO2, Arterial: 96.6

## 2010-10-31 LAB — CBC
HCT: 30.8 — ABNORMAL LOW
HCT: 32.2 — ABNORMAL LOW
HCT: 39.6
HCT: 24.9 — ABNORMAL LOW
HCT: 26.6 — ABNORMAL LOW
HCT: 28.8 — ABNORMAL LOW
HCT: 29.2 — ABNORMAL LOW
HCT: 30 — ABNORMAL LOW
HCT: 32.9 — ABNORMAL LOW
Hemoglobin: 10.8 — ABNORMAL LOW
Hemoglobin: 12.4
Hemoglobin: 10 — ABNORMAL LOW
Hemoglobin: 10.4 — ABNORMAL LOW
Hemoglobin: 11 — ABNORMAL LOW
Hemoglobin: 11.2 — ABNORMAL LOW
Hemoglobin: 8.5 — ABNORMAL LOW
Hemoglobin: 9 — ABNORMAL LOW
Hemoglobin: 9.8 — ABNORMAL LOW
Hemoglobin: 9.9 — ABNORMAL LOW
MCHC: 33.4
MCHC: 31.3
MCHC: 33
MCHC: 33.3
MCHC: 33.5
MCHC: 33.7
MCHC: 33.8
MCHC: 34.1
MCHC: 34.1
MCHC: 34.3
MCHC: 34.7
MCV: 87.9
MCV: 88.2
MCV: 88
MCV: 87.9
MCV: 88.1
MCV: 88.2
MCV: 88.5
MCV: 88.6
MCV: 88.9
MCV: 89.6
Platelets: 219
Platelets: 233
Platelets: 209
Platelets: 224
Platelets: 271
Platelets: 625 — ABNORMAL HIGH
Platelets: 653 — ABNORMAL HIGH
Platelets: 840 — ABNORMAL HIGH
RBC: 3.67 — ABNORMAL LOW
RBC: 4.5
RBC: 2.6 — ABNORMAL LOW
RBC: 2.8 — ABNORMAL LOW
RBC: 3.01 — ABNORMAL LOW
RBC: 3.19 — ABNORMAL LOW
RBC: 3.32 — ABNORMAL LOW
RBC: 3.34 — ABNORMAL LOW
RBC: 3.4 — ABNORMAL LOW
RBC: 3.6 — ABNORMAL LOW
RBC: 3.67 — ABNORMAL LOW
RBC: 3.85 — ABNORMAL LOW
RDW: 14.4
RDW: 14.4
RDW: 14.8
RDW: 14.1
RDW: 14.8
RDW: 14.9
RDW: 15.3
RDW: 15.3
RDW: 15.6 — ABNORMAL HIGH
RDW: 15.6 — ABNORMAL HIGH
WBC: 12.2 — ABNORMAL HIGH
WBC: 10.8 — ABNORMAL HIGH
WBC: 10.9 — ABNORMAL HIGH
WBC: 11 — ABNORMAL HIGH
WBC: 8.9
WBC: 9.4
WBC: 9.7
WBC: 9.8

## 2010-10-31 LAB — DIFFERENTIAL
Basophils Absolute: 0
Basophils Absolute: 0
Basophils Absolute: 0
Basophils Absolute: 0.1
Basophils Absolute: 0.1
Basophils Absolute: 0.1
Basophils Absolute: 0.1
Basophils Relative: 0
Basophils Relative: 0
Basophils Relative: 1
Basophils Relative: 1
Basophils Relative: 1
Basophils Relative: 1
Basophils Relative: 1
Eosinophils Absolute: 0
Eosinophils Absolute: 0
Eosinophils Absolute: 0.2
Eosinophils Absolute: 0.2
Eosinophils Absolute: 0.3
Eosinophils Absolute: 0.3
Eosinophils Absolute: 0.5
Eosinophils Relative: 0
Eosinophils Relative: 0
Eosinophils Relative: 2
Eosinophils Relative: 3
Eosinophils Relative: 3
Eosinophils Relative: 5
Lymphocytes Relative: 28
Lymphocytes Relative: 16
Lymphocytes Relative: 22
Lymphocytes Relative: 25
Lymphocytes Relative: 27
Lymphs Abs: 1.1
Lymphs Abs: 2.6
Lymphs Abs: 1.6
Lymphs Abs: 1.6
Lymphs Abs: 1.9
Lymphs Abs: 2.4
Lymphs Abs: 2.6
Monocytes Absolute: 0.7
Monocytes Absolute: 0.6
Monocytes Absolute: 0.7
Monocytes Absolute: 0.8
Monocytes Absolute: 0.8
Monocytes Absolute: 0.8
Monocytes Absolute: 0.9
Monocytes Absolute: 1
Monocytes Relative: 6
Monocytes Relative: 10
Monocytes Relative: 6
Monocytes Relative: 7
Monocytes Relative: 8
Monocytes Relative: 8
Monocytes Relative: 9
Neutro Abs: 5.7
Neutro Abs: 5.7
Neutro Abs: 6
Neutro Abs: 6.1
Neutro Abs: 7.1
Neutro Abs: 8 — ABNORMAL HIGH
Neutro Abs: 8.5 — ABNORMAL HIGH
Neutrophils Relative %: 63
Neutrophils Relative %: 61
Neutrophils Relative %: 63
Neutrophils Relative %: 67
Neutrophils Relative %: 72
Neutrophils Relative %: 74
Neutrophils Relative %: 77

## 2010-10-31 LAB — TSH: TSH: 0.601

## 2010-10-31 LAB — COMPREHENSIVE METABOLIC PANEL
ALT: 44 — ABNORMAL HIGH
ALT: 47 — ABNORMAL HIGH
Alkaline Phosphatase: 229 — ABNORMAL HIGH
BUN: 19
BUN: 7
BUN: 8
CO2: 24
CO2: 25
CO2: 27
Calcium: 9
Calcium: 8.2 — ABNORMAL LOW
Chloride: 97
Creatinine, Ser: 0.86
Creatinine, Ser: 0.54
GFR calc non Af Amer: >60
GFR calc non Af Amer: >60
Glucose, Bld: 136 — ABNORMAL HIGH
Glucose, Bld: 123 — ABNORMAL HIGH
Glucose, Bld: 130 — ABNORMAL HIGH
Potassium: 4.2
Sodium: 139
Sodium: 132 — ABNORMAL LOW
Total Bilirubin: 0.5
Total Protein: 6.6
Total Protein: 5.4 — ABNORMAL LOW

## 2010-10-31 LAB — HEPATIC FUNCTION PANEL
ALT: 30
AST: 20
Albumin: 2.6 — ABNORMAL LOW
Alkaline Phosphatase: 154 — ABNORMAL HIGH
Bilirubin, Direct: <0.1
Total Bilirubin: 0.4
Total Protein: 5.8 — ABNORMAL LOW

## 2010-10-31 LAB — PROTIME-INR
INR: 1
INR: 1.1
INR: 1.1
Prothrombin Time: 13
Prothrombin Time: 14.4
Prothrombin Time: 14.9

## 2010-10-31 LAB — BASIC METABOLIC PANEL
BUN: 8
BUN: 8
BUN: 10
BUN: 12
BUN: 6
BUN: 7
BUN: 7
BUN: 7
CO2: 20
CO2: 20
CO2: 23
CO2: 24
CO2: 25
CO2: 27
CO2: 28
CO2: 28
CO2: 29
CO2: 30
Calcium: 7.7 — ABNORMAL LOW
Calcium: 7.8 — ABNORMAL LOW
Calcium: 7.9 — ABNORMAL LOW
Calcium: 8 — ABNORMAL LOW
Calcium: 8.1 — ABNORMAL LOW
Calcium: 8.3 — ABNORMAL LOW
Calcium: 8.4
Calcium: 8.7
Calcium: 8.7
Calcium: 8.7
Chloride: 106
Chloride: 109
Chloride: 101
Chloride: 101
Chloride: 104
Chloride: 110
Chloride: 113 — ABNORMAL HIGH
Chloride: 115 — ABNORMAL HIGH
Chloride: 96
Chloride: 99
Creatinine, Ser: 0.58
Creatinine, Ser: 0.57
Creatinine, Ser: 0.61
Creatinine, Ser: 0.63
Creatinine, Ser: 0.63
Creatinine, Ser: 0.71
Creatinine, Ser: 0.71
Creatinine, Ser: 0.79
GFR calc Af Amer: >60
GFR calc Af Amer: >60
GFR calc Af Amer: >60
GFR calc Af Amer: >60
GFR calc Af Amer: >60
GFR calc Af Amer: >60
GFR calc Af Amer: >60
GFR calc Af Amer: >60
GFR calc Af Amer: >60
GFR calc Af Amer: >60
GFR calc non Af Amer: >60
GFR calc non Af Amer: >60
GFR calc non Af Amer: >60
GFR calc non Af Amer: >60
GFR calc non Af Amer: >60
GFR calc non Af Amer: >60
GFR calc non Af Amer: >60
GFR calc non Af Amer: >60
GFR calc non Af Amer: >60
Glucose, Bld: 140 — ABNORMAL HIGH
Glucose, Bld: 152 — ABNORMAL HIGH
Glucose, Bld: 123 — ABNORMAL HIGH
Glucose, Bld: 125 — ABNORMAL HIGH
Glucose, Bld: 126 — ABNORMAL HIGH
Glucose, Bld: 127 — ABNORMAL HIGH
Glucose, Bld: 128 — ABNORMAL HIGH
Glucose, Bld: 133 — ABNORMAL HIGH
Glucose, Bld: 142 — ABNORMAL HIGH
Potassium: 3.4 — ABNORMAL LOW
Potassium: 3.4 — ABNORMAL LOW
Potassium: 3.5
Potassium: 3.5
Potassium: 3.9
Potassium: 4
Potassium: 4
Potassium: 4.3
Sodium: 138
Sodium: 134 — ABNORMAL LOW
Sodium: 135
Sodium: 136
Sodium: 137
Sodium: 137
Sodium: 138
Sodium: 139
Sodium: 141
Sodium: 145

## 2010-10-31 LAB — CK TOTAL AND CKMB (NOT AT ARMC)
CK, MB: 1.6
Relative Index: INVALID
Total CK: 98

## 2010-10-31 LAB — URINE CULTURE
Colony Count: >=100000
Special Requests: NEGATIVE

## 2010-10-31 LAB — POCT I-STAT 7, (LYTES, BLD GAS, ICA,H+H)
Acid-base deficit: 3 — ABNORMAL HIGH
Bicarbonate: 21.9
Calcium, Ion: 1.12
HCT: 31 — ABNORMAL LOW
Hemoglobin: 10.5 — ABNORMAL LOW
O2 Saturation: 100
Operator id: 219281
Patient temperature: 34.4
Potassium: 3.9
Sodium: 140
TCO2: 23
pCO2 arterial: 34.2 — ABNORMAL LOW
pH, Arterial: 7.402 — ABNORMAL HIGH
pO2, Arterial: 498 — ABNORMAL HIGH

## 2010-10-31 LAB — CROSSMATCH: ABO/RH(D): O POS

## 2010-10-31 LAB — PHENYTOIN LEVEL, TOTAL
Phenytoin Lvl: 6.1 — ABNORMAL LOW
Phenytoin Lvl: 6.6 — ABNORMAL LOW
Phenytoin Lvl: 7.4 — ABNORMAL LOW
Phenytoin Lvl: 8.2 — ABNORMAL LOW
Phenytoin Lvl: 8.7 — ABNORMAL LOW

## 2010-10-31 LAB — URINALYSIS, ROUTINE W REFLEX MICROSCOPIC
Bilirubin Urine: NEGATIVE
Glucose, UA: NEGATIVE
Hgb urine dipstick: NEGATIVE
Protein, ur: NEGATIVE
Specific Gravity, Urine: 1.018
Specific Gravity, Urine: 1.022
Urobilinogen, UA: 0.2
Urobilinogen, UA: 4 — ABNORMAL HIGH

## 2010-10-31 LAB — CARDIAC PANEL(CRET KIN+CKTOT+MB+TROPI)
CK, MB: 20.7 — ABNORMAL HIGH
CK, MB: 33.3 — ABNORMAL HIGH

## 2010-10-31 LAB — MAGNESIUM
Magnesium: 1.8
Magnesium: 2
Magnesium: 2
Magnesium: 2
Magnesium: 2.1

## 2010-10-31 LAB — GAMMA GT
GGT: 180 — ABNORMAL HIGH
GGT: 306 — ABNORMAL HIGH

## 2010-10-31 LAB — IRON AND TIBC
Iron: 37 — ABNORMAL LOW
Saturation Ratios: 19 — ABNORMAL LOW
TIBC: 192 — ABNORMAL LOW
UIBC: 155

## 2010-10-31 LAB — CULTURE, BLOOD (ROUTINE X 2)
Culture: NO GROWTH
Culture: NO GROWTH

## 2010-10-31 LAB — PHOSPHORUS
Phosphorus: 2.3
Phosphorus: 2.4
Phosphorus: 3.5

## 2010-10-31 LAB — TYPE AND SCREEN
ABO/RH(D): O POS
Antibody Screen: NEGATIVE

## 2010-10-31 LAB — CULTURE, BAL-QUANTITATIVE W GRAM STAIN

## 2010-10-31 LAB — APTT: aPTT: 35

## 2010-10-31 LAB — FERRITIN: Ferritin: 142 (ref 10–291)

## 2010-10-31 LAB — OCCULT BLOOD X 1 CARD TO LAB, STOOL: Fecal Occult Bld: NEGATIVE

## 2010-10-31 LAB — TROPONIN I: Troponin I: 0.27 — ABNORMAL HIGH

## 2010-10-31 LAB — URINE MICROSCOPIC-ADD ON

## 2010-10-31 LAB — FOLATE: Folate: 11.4

## 2010-10-31 LAB — RETICULOCYTES
RBC.: 2.96 — ABNORMAL LOW
Retic Count, Absolute: 74
Retic Ct Pct: 2.5

## 2010-10-31 LAB — VITAMIN B12: Vitamin B-12: 406 (ref 211–911)

## 2010-10-31 LAB — ABO/RH: ABO/RH(D): O POS

## 2010-10-31 LAB — HEMOGLOBIN A1C
Hgb A1c MFr Bld: 5.6
Mean Plasma Glucose: 122

## 2010-11-01 LAB — URINALYSIS, ROUTINE W REFLEX MICROSCOPIC
Ketones, ur: NEGATIVE
Nitrite: NEGATIVE
Specific Gravity, Urine: 1.008
pH: 6.5

## 2010-11-01 LAB — DIFFERENTIAL
Eosinophils Absolute: 0.3
Eosinophils Relative: 5
Lymphocytes Relative: 32
Lymphocytes Relative: 34
Lymphs Abs: 2.1
Lymphs Abs: 2.1
Monocytes Absolute: 0.4
Neutrophils Relative %: 52

## 2010-11-01 LAB — CBC
HCT: 25.9 — ABNORMAL LOW
Hemoglobin: 8.8 — ABNORMAL LOW
Hemoglobin: 10.6 — ABNORMAL LOW
MCHC: 34
MCHC: 33.3
MCV: 89.5
MCV: 90.4
MCV: 91.3
Platelets: 519 — ABNORMAL HIGH
Platelets: 590 — ABNORMAL HIGH
RBC: 3.48 — ABNORMAL LOW
RDW: 16.2 — ABNORMAL HIGH
WBC: 6.6

## 2010-11-01 LAB — BASIC METABOLIC PANEL
BUN: 4 — ABNORMAL LOW
CO2: 23
Chloride: 104
Glucose, Bld: 99
Potassium: 3.7
Sodium: 135

## 2010-11-01 LAB — COMPREHENSIVE METABOLIC PANEL
ALT: 15
AST: 15
Albumin: 2.9 — ABNORMAL LOW
CO2: 23
Calcium: 8.9
Calcium: 8.7
Chloride: 104
Creatinine, Ser: 0.54
Creatinine, Ser: 0.69
GFR calc Af Amer: >60
GFR calc non Af Amer: >60
Glucose, Bld: 152 — ABNORMAL HIGH
Sodium: 141
Total Bilirubin: 0.3

## 2010-11-01 LAB — URINE CULTURE: Colony Count: 40000

## 2010-11-01 LAB — URINE MICROSCOPIC-ADD ON

## 2010-11-01 LAB — PHENYTOIN LEVEL, TOTAL: Phenytoin Lvl: 11.3

## 2010-11-02 LAB — DIFFERENTIAL
Basophils Absolute: 0.1
Basophils Relative: 1
Eosinophils Absolute: 0.3
Eosinophils Relative: 4
Lymphocytes Relative: 37
Lymphs Abs: 2.6
Monocytes Absolute: 0.6
Monocytes Relative: 8
Neutro Abs: 3.6
Neutrophils Relative %: 51

## 2010-11-02 LAB — CBC
HCT: 39.3
Hemoglobin: 13.5
MCHC: 34.4
MCV: 91.3
Platelets: 256
RBC: 4.3
RDW: 16.3 — ABNORMAL HIGH
WBC: 7.1

## 2010-11-02 LAB — PHENYTOIN LEVEL, TOTAL: Phenytoin Lvl: 6 — ABNORMAL LOW

## 2012-06-17 DIAGNOSIS — Z96649 Presence of unspecified artificial hip joint: Secondary | ICD-10-CM | POA: Insufficient documentation

## 2012-06-17 DIAGNOSIS — M25559 Pain in unspecified hip: Secondary | ICD-10-CM | POA: Insufficient documentation

## 2012-10-20 DIAGNOSIS — M1612 Unilateral primary osteoarthritis, left hip: Secondary | ICD-10-CM | POA: Insufficient documentation

## 2015-03-02 ENCOUNTER — Other Ambulatory Visit: Payer: Self-pay | Admitting: Family Medicine

## 2015-03-02 ENCOUNTER — Ambulatory Visit
Admission: RE | Admit: 2015-03-02 | Discharge: 2015-03-02 | Disposition: A | Discharge status: Home / Self Care | Payer: PRIVATE HEALTH INSURANCE | Source: Ambulatory Visit | Attending: Family Medicine | Admitting: Family Medicine

## 2015-03-02 DIAGNOSIS — R600 Localized edema: Secondary | ICD-10-CM

## 2015-11-29 ENCOUNTER — Emergency Department (HOSPITAL_COMMUNITY): Payer: Medicare Other

## 2015-11-29 ENCOUNTER — Emergency Department (HOSPITAL_COMMUNITY)
Admission: EM | Admit: 2015-11-29 | Discharge: 2015-11-30 | Disposition: A | Discharge status: Home / Self Care | Payer: Medicare Other | Attending: Emergency Medicine | Admitting: Emergency Medicine

## 2015-11-29 ENCOUNTER — Encounter (HOSPITAL_COMMUNITY): Payer: Self-pay | Admitting: Emergency Medicine

## 2015-11-29 DIAGNOSIS — M1612 Unilateral primary osteoarthritis, left hip: Secondary | ICD-10-CM | POA: Insufficient documentation

## 2015-11-29 DIAGNOSIS — M169 Osteoarthritis of hip, unspecified: Secondary | ICD-10-CM

## 2015-11-29 DIAGNOSIS — M25552 Pain in left hip: Secondary | ICD-10-CM | POA: Diagnosis present

## 2015-11-29 HISTORY — DX: Unspecified osteoarthritis, unspecified site: M19.90

## 2015-11-29 HISTORY — DX: Aneurysm of unspecified site: I72.9

## 2015-11-29 LAB — CBC WITH DIFFERENTIAL/PLATELET
BASOS ABS: 0 10*3/uL (ref 0.0–0.1)
Basophils Relative: 0 %
Eosinophils Absolute: 0.1 10*3/uL (ref 0.0–0.7)
Eosinophils Relative: 2 %
HEMATOCRIT: 40.6 % (ref 36.0–46.0)
HEMOGLOBIN: 13.2 g/dL (ref 12.0–15.0)
LYMPHS PCT: 18 %
Lymphs Abs: 1.5 10*3/uL (ref 0.7–4.0)
MCH: 29.7 pg (ref 26.0–34.0)
MCHC: 32.5 g/dL (ref 30.0–36.0)
MCV: 91.2 fL (ref 78.0–100.0)
MONO ABS: 0.6 10*3/uL (ref 0.1–1.0)
MONOS PCT: 7 %
NEUTROS ABS: 6.2 10*3/uL (ref 1.7–7.7)
Neutrophils Relative %: 73 %
Platelets: 227 10*3/uL (ref 150–400)
RBC: 4.45 MIL/uL (ref 3.87–5.11)
RDW: 15.3 % (ref 11.5–15.5)
WBC: 8.5 10*3/uL (ref 4.0–10.5)

## 2015-11-29 LAB — BASIC METABOLIC PANEL
ANION GAP: 9 (ref 5–15)
BUN: 12 mg/dL (ref 6–20)
CHLORIDE: 106 mmol/L (ref 101–111)
CO2: 27 mmol/L (ref 22–32)
Calcium: 9.5 mg/dL (ref 8.9–10.3)
Creatinine, Ser: 0.92 mg/dL (ref 0.44–1.00)
GFR calc Af Amer: >60 mL/min (ref >60)
GFR calc non Af Amer: 55 mL/min — ABNORMAL LOW (ref >60)
GLUCOSE: 102 mg/dL — AB (ref 65–99)
Potassium: 4.7 mmol/L (ref 3.5–5.1)
Sodium: 142 mmol/L (ref 135–145)

## 2015-11-29 MED ORDER — FENTANYL CITRATE (PF) 100 MCG/2ML IJ SOLN
50.0000 ug | Freq: Once | INTRAMUSCULAR | Status: AC
  Administered 2015-11-30: 50 ug via INTRAVENOUS
  Filled 2015-11-29: qty 2

## 2015-11-29 NOTE — ED Notes (Signed)
Patient transported to CT 

## 2015-11-29 NOTE — ED Provider Notes (Signed)
[facility]-EMER[facility]ENCY DEPT Provider Note   CSN: [id number] Arrival date & time: 11/29/15  2026  By signing my name below, I, Rosario Adie, attest that this documentation has been prepared under the direction and in the presence of Tomasita Crumble, MD. Electronically Signed: Rosario Adie, ED Scribe. 11/29/15. 11:08 PM.  History   Chief Complaint Chief Complaint  Patient presents with  . Hip Pain   The history is provided by the patient and a relative. No language interpreter was used.   HPI Comments: [name] is a 82 y.o. female BIB EMS, with a PMHx of osteoarthritis of the left hip, who presents to the Emergency Department complaining of gradual onset, acute on chronic left hip pain onset "years ago", worsening today PTA. Pt reports that she has a hx of chronic left hip pain; however, today she was unable to bear weight on her left leg due to it exacerbating her current pain. Per pt and family at bedside, she was able to bear weight on the hip prior to today. No recent trauma or injury to the hip. No known treatments were tried prior to coming into the ED via pt or EMS. She has a PSHx of total right hip arthroplasty, and was due to have the same performed on the left hip several years ago; however, she states that she did have have it performed. Pt currently has a night caretaker who stays with her but otherwise lives alone. Denies abdominal pain, numbness, or any other associated symptoms.  Orthopedic Surgeon: Dr. Georgena Spurling, [facility]  Past Medical History:  Diagnosis Date  . Aneurysm (HCC)   . Arthritis    There are no active problems to display for this patient.  History reviewed. No pertinent surgical history.  OB History    No data available     Home Medications    Prior to Admission medications   Medication Sig Start Date End Date Taking? Authorizing Provider  bimatoprost (LUMIGAN) 0.01 % SOLN Place 1 drop into both eyes at bedtime.   Yes Historical Provider,  MD  furosemide (LASIX) 20 MG tablet Take 20-40 mg by mouth See admin instructions. Takes 2 tabs in am and 1 tab in pm   Yes Historical Provider, MD  gabapentin (NEURONTIN) 300 MG capsule Take 900 mg by mouth 3 (three) times daily.   Yes Historical Provider, MD  levothyroxine (SYNTHROID, LEVOTHROID) 88 MCG tablet Take 88 mcg by mouth daily before breakfast.   Yes Historical Provider, MD  metoprolol (LOPRESSOR) 100 MG tablet Take 50 mg by mouth 2 (two) times daily.   Yes Historical Provider, MD  NON FORMULARY Take 2 capsules by mouth daily. "Rejuveniix" energy and mental alertness   Yes Historical Provider, MD  NON FORMULARY Take 1 capsule by mouth daily. "Omega Q" omega 3 and coq10   Yes Historical Provider, MD  NON FORMULARY Take 3 capsules by mouth daily. "Optimal V" heart, eyes, skin and lungs   Yes Historical Provider, MD  NON FORMULARY Take 2 capsules by mouth daily. "optimal M" bones, nerves, muscles   Yes Historical Provider, MD  NON FORMULARY Take 2 capsules by mouth daily. "Magnical D" bones and muscles   Yes Historical Provider, MD  ranitidine (ZANTAC) 300 MG tablet Take 300 mg by mouth 2 (two) times daily.   Yes Historical Provider, MD  sertraline (ZOLOFT) 100 MG tablet Take 150 mg by mouth daily.   Yes Historical Provider, MD   Family History No family history on file.  Social History Social History  Substance Use Topics  . Smoking status: Never Smoker  . Smokeless tobacco: Never Used  . Alcohol use No   Allergies   Review of patient's allergies indicates no known allergies.  Review of Systems Review of Systems A complete 10 system review of systems was obtained and all systems are negative except as noted in the HPI and PMH.   Physical Exam Updated Vital Signs BP 132/56   Pulse 68   Temp 98 F (36.7 C)   Resp 16   SpO2 95%   Physical Exam  Constitutional: She is oriented to person, place, and time. She appears well-developed and well-nourished. No distress.  HENT:   Head: Normocephalic and atraumatic.  Nose: Nose normal.  Mouth/Throat: Oropharynx is clear and moist. No oropharyngeal exudate.  Eyes: Conjunctivae and EOM are normal. Pupils are equal, round, and reactive to light. No scleral icterus.  Neck: Normal range of motion. Neck supple. No JVD present. No tracheal deviation present. No thyromegaly present.  Cardiovascular: Normal rate, regular rhythm and normal heart sounds.  Exam reveals no gallop and no friction rub.   No murmur heard. Pulmonary/Chest: Effort normal and breath sounds normal. No respiratory distress. She has no wheezes. She exhibits no tenderness.  Abdominal: Soft. Bowel sounds are normal. She exhibits no distension and no mass. There is no tenderness. There is no rebound and no guarding.  Musculoskeletal: She exhibits tenderness. She exhibits no edema.  TTP of the left hip. Limited ROM secondary to pain. Normal pulses and sensation distally.   Lymphadenopathy:    She has no cervical adenopathy.  Neurological: She is alert and oriented to person, place, and time. No cranial nerve deficit. She exhibits normal muscle tone.  Skin: Skin is warm and dry. No rash noted. No erythema. No pallor.  Nursing note and vitals reviewed.  ED Treatments / Results  DIAGNOSTIC STUDIES: Oxygen Saturation is 97% on RA, normal by my interpretation.   COORDINATION OF CARE: 11:06 PM-Discussed next steps with pt. Pt verbalized understanding and is agreeable with the plan.   Labs (all labs ordered are listed, but only abnormal results are displayed) Labs Reviewed  BASIC METABOLIC PANEL - Abnormal; Notable for the following:       Result Value   Glucose, Bld 102 (*)    GFR calc non Af Amer 55 (*)    All other components within normal limits  CBC WITH DIFFERENTIAL/PLATELET   EKG  EKG Interpretation None      Radiology Ct Hip Left Wo Contrast  Result Date: 11/30/2015 CLINICAL DATA:  82 year old female with left hip pain.  No trauma.  EXAM: CT OF THE LEFT HIP WITHOUT CONTRAST TECHNIQUE: Multidetector CT imaging of the left hip was performed according to the standard protocol. Multiplanar CT image reconstructions were also generated. COMPARISON:  None. FINDINGS: Bones/Joint/Cartilage There is no acute fracture or dislocation. There is advanced osteopenia. There is severe osteoarthritic changes of the left hip cortical sclerosis and irregularity and subcortical cysts. There is near complete loss of joint space. There is heterotopic bone formation along the left femoral head which may be related to prior fracture. No effusion or fluid collection. Ligaments Suboptimally assessed by CT. Muscles and Tendons Grossly unremarkable.  No inflammation or fluid collection. Soft tissues There is extensive sigmoid diverticulosis. Partially visualized thickening of the sigmoid colon with surrounding stranding is concerning for acute diverticulitis. Clinical correlation is recommended. CT of the abdomen pelvis may provide better evaluation. IMPRESSION: No acute  fracture or dislocation. Advanced osteopenia with severe osteoarthritic changes of the left hip and loss of joint space. Sigmoid diverticulosis with findings concerning for acute diverticulitis. Clinical correlation is recommended. CT of the abdomen pelvis is recommended for further evaluation. Electronically Signed   By: Elgie Collard M.D.   On: 11/30/2015 00:13    Procedures Procedures  Medications Ordered in ED Medications  fentaNYL (SUBLIMAZE) injection 50 mcg (50 mcg Intravenous Given 11/30/15 0123)  ketorolac (TORADOL) 30 MG/ML injection 30 mg (30 mg Intravenous Given 11/30/15 0123)    Initial Impression / Assessment and Plan / ED Course  I have reviewed the triage vital signs and the nursing notes.  Pertinent labs & imaging results that were available during my care of the patient were reviewed by me and considered in my medical decision making (see chart for details).  Clinical  Course   Patient presents to the ED for L hip pain that is acute on chronic.  She denies trauma, but there may be an occult fracture there.  Will jump to CT imaging for further evaluation.  She was given fentanyl for pain control.  Patient may need case management services in the morning if she can not ambulate alone.  She does live at home alone.  6:58 AM Patient will be signed out to oncoming team for case management consultation.  Final Clinical Impressions(s) / ED Diagnoses   Final diagnoses:  None   New Prescriptions New Prescriptions   No medications on file   I personally performed the services described in this documentation, which was scribed in my presence. The recorded information has been reviewed and is accurate.       Tomasita Crumble, MD 11/30/15 [phone]

## 2015-11-29 NOTE — ED Triage Notes (Signed)
Per EMS, pt from home with left hip pain and inability to bear weight on hip. Pt denies fall/trauma. Pt has chronic hip pain. Hx right hip replacement. VSS

## 2015-11-30 MED ORDER — METOPROLOL TARTRATE 25 MG PO TABS: 50.0000 mg | ORAL_TABLET | Freq: Two times a day (BID) | ORAL | Status: DC

## 2015-11-30 MED ORDER — SERTRALINE HCL 50 MG PO TABS
150.0000 mg | ORAL_TABLET | Freq: Every day | ORAL | Status: DC
  Administered 2015-11-30: 150 mg via ORAL
  Filled 2015-11-30: qty 3

## 2015-11-30 MED ORDER — HYDROCODONE-ACETAMINOPHEN 5-325 MG PO TABS: 1.0000 | ORAL_TABLET | ORAL | 0 refills | Status: DC | PRN

## 2015-11-30 MED ORDER — LEVOTHYROXINE SODIUM 88 MCG PO TABS: 88.0000 ug | ORAL_TABLET | Freq: Every day | ORAL | Status: DC

## 2015-11-30 MED ORDER — FUROSEMIDE 20 MG PO TABS: 20.0000 mg | ORAL_TABLET | ORAL | Status: DC

## 2015-11-30 MED ORDER — FAMOTIDINE 20 MG PO TABS
10.0000 mg | ORAL_TABLET | Freq: Every day | ORAL | Status: DC
  Administered 2015-11-30: 10 mg via ORAL
  Filled 2015-11-30: qty 1

## 2015-11-30 MED ORDER — HYDROCODONE-ACETAMINOPHEN 5-325 MG PO TABS
1.0000 | ORAL_TABLET | Freq: Four times a day (QID) | ORAL | Status: DC | PRN
  Administered 2015-11-30: 1 via ORAL
  Filled 2015-11-30: qty 1

## 2015-11-30 MED ORDER — KETOROLAC TROMETHAMINE 30 MG/ML IJ SOLN
30.0000 mg | Freq: Once | INTRAMUSCULAR | Status: AC
  Administered 2015-11-30: 30 mg via INTRAVENOUS
  Filled 2015-11-30: qty 1

## 2015-11-30 MED ORDER — GABAPENTIN 300 MG PO CAPS
900.0000 mg | ORAL_CAPSULE | Freq: Three times a day (TID) | ORAL | Status: DC
  Administered 2015-11-30: 900 mg via ORAL
  Filled 2015-11-30: qty 3

## 2015-11-30 NOTE — Discharge Planning (Signed)
[facility] of [facility].  Orthopedic surgeon Eulah Pont([place]) will work up pt for future hip surgery, so pt is now ready for discharge.

## 2015-11-30 NOTE — ED Notes (Signed)
Assisted Bryanindy, RN with bedpan removal and pericare.

## 2015-11-30 NOTE — NC FL2 (Signed)
[place] MEDICAID FL2 LEVEL OF CARE SCREENING TOOL     IDENTIFICATION  Patient Name: [name] Birthdate: 04-Mar-1927 Sex: female Admission Date (Current Location): 11/29/2015  [facility] and [place] Number:  Producer, television/film/video and Address:  [facility], [address], [place], [place] [address]      Provider Number: [id number]  Attending Physician Name and Address:  Vanetta Mulders, MD  Relative Name and Phone Number:  Jari Favre (Daughter) [phone]; [phone] Darl Pikes Cord (Daughter) [phone] Telford Nab (Daughter) [phone]    Current Level of Care: SNF Recommended Level of Care: Skilled Nursing Facility Prior Approval Number:    Date Approved/Denied:   PASRR Number: [id number]  Discharge Plan: SNF    Current Diagnoses: There are no active problems to display for this patient.   Orientation RESPIRATION BLADDER Height & Weight     Self, Time, Situation, Place  Normal Continent Weight:   Height:     BEHAVIORAL SYMPTOMS/MOOD NEUROLOGICAL BOWEL NUTRITION STATUS   (NONE)  (NONE) Continent Diet  AMBULATORY STATUS COMMUNICATION OF NEEDS Skin   Extensive Assist Verbally Normal                       Personal Care Assistance Level of Assistance  Bathing, Dressing, Feeding Bathing Assistance: Limited assistance Feeding assistance: Independent Dressing Assistance: Limited assistance     Functional Limitations Info  Sight, Hearing, Speech Sight Info: Adequate Hearing Info: Adequate Speech Info: Adequate    SPECIAL CARE FACTORS FREQUENCY  PT (By licensed PT)     PT Frequency: MIN 3X/WEEK              Contractures Contractures Info: Not present    Additional Factors Info  Code Status, Allergies Code Status Info: Not on File Allergies Info: No Known Allergies           Current Medications (11/30/2015):  This is the current hospital active medication list No current facility-administered medications  for this encounter.    Current Outpatient Prescriptions  Medication Sig Dispense Refill  . bimatoprost (LUMIGAN) 0.01 % SOLN Place 1 drop into both eyes at bedtime.    . furosemide (LASIX) 20 MG tablet Take 20-40 mg by mouth See admin instructions. Takes 2 tabs in am and 1 tab in pm    . gabapentin (NEURONTIN) 300 MG capsule Take 900 mg by mouth 3 (three) times daily.    Marland Kitchen levothyroxine (SYNTHROID, LEVOTHROID) 88 MCG tablet Take 88 mcg by mouth daily before breakfast.    . metoprolol (LOPRESSOR) 100 MG tablet Take 50 mg by mouth 2 (two) times daily.    . NON FORMULARY Take 2 capsules by mouth daily. "Rejuveniix" energy and mental alertness    . NON FORMULARY Take 1 capsule by mouth daily. "Omega Q" omega 3 and coq10    . NON FORMULARY Take 3 capsules by mouth daily. "Optimal V" heart, eyes, skin and lungs    . NON FORMULARY Take 2 capsules by mouth daily. "optimal M" bones, nerves, muscles    . NON FORMULARY Take 2 capsules by mouth daily. "Magnical D" bones and muscles    . ranitidine (ZANTAC) 300 MG tablet Take 300 mg by mouth 2 (two) times daily.    . sertraline (ZOLOFT) 100 MG tablet Take 150 mg by mouth daily.       Discharge Medications: Please see discharge summary for a list of discharge medications.  Relevant Imaging Results:  Relevant Lab Results:  Additional Information SS #[id number]  Rockwell GermanyAshley N Gardner, LCSW

## 2015-11-30 NOTE — Progress Notes (Signed)
CSW called [facility] to confirm discharge. [facility] able to accept patient tonight and patients room number is 902. Patient and patients daughter are aware of discharge [facility]. Number for report is [phone].   Stacy GardnerErin Samari Gorby, LCSWA Clinical Social Worker [phone]

## 2015-11-30 NOTE — ED Notes (Signed)
[facility] contacted to tx [facility]

## 2015-11-30 NOTE — ED Notes (Signed)
Patient assisted x2 onto fracture pan to void.

## 2015-11-30 NOTE — ED Notes (Signed)
Pt assisted on bedpan and she voided smnall amt , pt cleaned and pulled up in bed PT went to speak to social worker about possible rehab placement

## 2015-11-30 NOTE — Evaluation (Signed)
Physical Therapy Evaluation Patient Details Name: [name] MRN: [id number] DOB: 13-Apr-1927 Today's Date: 11/30/2015   History of Present Illness  [name] is a 82 y.o. female with a PMHx of osteoarthritis of the left hip, who presents to the Emergency Department complaining of gradual onset, acute on chronic left hip pain onset "years ago", worsening on day of presentation to ED, unable to bear weight; CT showing severe OA, Advanced osteopenia with severe osteoarthritic changes of the left  Clinical Impression   Pt presented to ED with above diagnosis. Pt currently with functional limitations due to the deficits listed below (see PT Problem List). Presents with L hip pain significantly effecting ability to move, walk, and perform ADLs; she must be independent to be able to safely dc home, and currently requires mod to max assist with functional mobility;  Discussed with Case Mgr and LCSW; Pt will benefit from skilled PT to increase their independence and safety with mobility to allow discharge to the venue listed below.    Highly recommend an Ortho Consult to look further into a long-term solution for her L hip arthritis pain.     Follow Up Recommendations SNF;Other (comment) (If Ortho opts for surgery for her L hip, I heartily endorse CIR for rehab postop)    Equipment Recommendations  Rolling walker with 5" wheels;3in1 (PT)    Recommendations for Other Services OT consult     Precautions / Restrictions Precautions Precautions: Fall Restrictions Weight Bearing Restrictions: No (WBAT)      Mobility  Bed Mobility Overal bed mobility: Needs Assistance Bed Mobility: Rolling Rolling: Mod assist (2nd person helpful)         General bed mobility comments: Cues for technqiue an dmod assist to support LLE with moving; very good use of rails for bed mobility; +2 assist to elevate trunk to sit; +2 max assist to help LEs into bed and lay down  Transfers Overall transfer  level: Needs assistance Equipment used: Rolling walker (2 wheeled) Transfers: Sit to/from Stand Sit to Stand: +2 physical assistance;Mod assist         General transfer comment: +2 heavy mod assist to rise; cues for hand placement and safety, and to rely on RLE and UEs for support as needed  Ambulation/Gait Ambulation/Gait assistance: +2 physical assistance;Mod assist Ambulation Distance (Feet): 2 Feet (2 feet forward and backward; sidesteps to [facility]) Assistive device: Rolling walker (2 wheeled);2 person hand held assist Gait Pattern/deviations: Decreased stance time - left;Decreased step length - right     General Gait Details: Needing more assistance and support in L stance to allow for stepping RLE; cues for sequence and to use RW to unweigh painful LLE in standing  Stairs            Wheelchair Mobility    Modified Rankin (Stroke Patients Only)       Balance Overall balance assessment: Needs assistance Sitting-balance support: No upper extremity supported Sitting balance-Leahy Scale: Fair (approaching Good)       Standing balance-Leahy Scale: Poor Standing balance comment: Heavily reliant on UE support                             Pertinent Vitals/Pain Pain Assessment: Faces Faces Pain Scale: Hurts whole lot Pain Location: L hip with movement, ROM, and especially weight bearing Pain Descriptors / Indicators: Aching;Grimacing;Guarding (audible crepitus) Pain Intervention(s): Limited activity within patient's tolerance;Monitored during session;Repositioned    Home Living Family/patient  expects to be discharged to:: Skilled nursing facility Living Arrangements: Alone;Other (Comment) (Has assistance overnight) Available Help at Discharge: Family;Available PRN/intermittently Type of Home: House Home Access: Ramped entrance     Home Layout: One level Home Equipment: Walker - 4 wheels;Other (comment) (I believe she has other equipment; will update next  session)      Prior Function Level of Independence: Independent with assistive device(s)         Comments: Manageing household distances independently with Rollator RW until past few days leading up to this ED visit     Hand Dominance        Extremity/Trunk Assessment   Upper Extremity Assessment: Overall WFL for tasks assessed           Lower Extremity Assessment: Generalized weakness;LLE deficits/detail   LLE Deficits / Details: Audible crepitus with Weight Bearing; Grimace with AA/ROM L hip, also with rolling     Communication   Communication: No difficulties  Cognition Arousal/Alertness: Awake/alert Behavior During Therapy: WFL for tasks assessed/performed Overall Cognitive Status: Within Functional Limits for tasks assessed                      General Comments General comments (skin integrity, edema, etc.): Applied brief to help boost pt's confidence getting up (she worries about urinary incontinence); informed her on the risks of having a wet brief against her skin for too long    Exercises     Assessment/Plan    PT Assessment Patient needs continued PT services  PT Problem List Decreased strength;Decreased range of motion;Decreased activity tolerance;Decreased balance;Decreased mobility;Decreased coordination;Decreased knowledge of use of DME;Decreased safety awareness;Decreased knowledge of precautions;Pain;Impaired sensation          PT Treatment Interventions DME instruction;Gait training;Functional mobility training;Therapeutic activities;Therapeutic exercise;Patient/family education;Balance training    PT Goals (Current goals can be found in the Care Plan section)  Acute Rehab PT Goals Patient Stated Goal: Hopes to get this pain managed (surgery?) and get back to independence PT Goal Formulation: With patient Time For Goal Achievement: 12/14/15 Potential to Achieve Goals: Good    Frequency Min 3X/week   Barriers to discharge  Decreased caregiver support Must be modifiend independent to be able to go home safely    Co-evaluation               End of Session Equipment Utilized During Treatment: Gait belt Activity Tolerance: Patient limited by pain Patient left: in bed;with call bell/phone within reach;with family/visitor present Nurse Communication: Mobility status    Functional Assessment Tool Used: Clinical Judgement Functional Limitation: Mobility: Walking and moving around Mobility: Walking and Moving Around Current Status (W0981): At least 60 percent but less than 80 percent impaired, limited or restricted Mobility: Walking and Moving Around Goal Status [phone]): At least 1 percent but less than 20 percent impaired, limited or restricted    Time: 0943-1022 PT Time Calculation (min) (ACUTE ONLY): 39 min   Charges:   PT Evaluation $PT Eval Moderate Complexity: 1 Procedure PT Treatments $Gait Training: 8-22 mins $Therapeutic Activity: 8-22 mins   PT G Codes:   PT G-Codes **NOT FOR INPATIENT CLASS** Functional Assessment Tool Used: Clinical Judgement Functional Limitation: Mobility: Walking and moving around Mobility: Walking and Moving Around Current Status (W2956): At least 60 percent but less than 80 percent impaired, limited or restricted Mobility: Walking and Moving Around Goal Status [phone]): At least 1 percent but less than 20 percent impaired, limited or restricted    Van Clines Hamff  11/30/2015, 11:03 AM   Van ClinesHolly Lexie Koehl, PT  Acute Rehabilitation Services Pager [phone] Office [phone]

## 2015-11-30 NOTE — ED Notes (Signed)
Attempted PIV without success 

## 2015-11-30 NOTE — Progress Notes (Signed)
CSW engaged with Patient and her daughters to discuss PT's recommendation of SNF placement. Patient's daughters are requesting that Patient be seen by an ortho surgeon prior to D/C so that she can have surgery for her left hip. CSW explained that per MD, Patient would not have surgery on today and that Patient does not meet criteria at this time for admission into the hospital. Patient's daughters present that they would like to she Ortho for further decisions. CSW staffed with MD and RN. CSW continues to follow for disposition.          Lance MussAshley Gardner,MSW, LCSW [facility] Clinical Social Worker [phone]

## 2015-11-30 NOTE — ED Provider Notes (Signed)
Pt has been accepted at the nursing facility.  They are coming to pick her up   Connie DibblesJon Samantha Olivera, MD 11/30/15 [phone]

## 2015-11-30 NOTE — Discharge Planning (Signed)
Spoke with pt and daughters at beside regarding discharge planning.  Pt and daughters think pt will benefit from PT evaluation as pt stays at home alone during the day and can not walk.  EDCM discussed with EDP and will await direction.   Family also wants to talk to ortho to discuss surgery prior to dc today.

## 2015-11-30 NOTE — ED Notes (Signed)
Lunch  Ordered for pt and family has gone to get lunch, dr Herma CarsonZ into give pt update and ordered an ortho consult for pt

## 2015-11-30 NOTE — ED Notes (Signed)
Pt pulled up in bed made comfortable set up for breakfast

## 2015-11-30 NOTE — ED Notes (Signed)
Attempted to ambulate patient with 2x assist and walker.  Pt able to rise to standing, but unable to bear weight and unable to take a step, reporting pain.  MD notified.

## 2015-11-30 NOTE — ED Notes (Signed)
To [facility] by ptar

## 2015-11-30 NOTE — ED Provider Notes (Signed)
Addendum to today's events.  Patient was evaluated by case management and social worker had placement available at a rehabilitation center. Patient was evaluated by physical therapy which gave clearance that the patient needed to go to rehabilitation could not take care of self at home. Family then refused to go to rehabilitation they were convinced that they would be able to get admitted by orthopedics to have the left hip replaced. CT of the hip shows no bony injuries. Patient's had long-standing trouble with the left hip. Now become severe and patient cannot stand. Patient comfortable in the bed throughout the day did not require any pain medicines. Patient's orthopedic doctor in the past was Dr. Valentina GuLucy who recommended hip replacement several months ago. Contacted his office spoke with his PA. Dr. Valentina GuLucy no longer does hip replacements it would normally refer her to somebody else. Family member is a patient of Dr. Greig RightMurphy's so Dr. Eulah PontMurphy contacted. Dr. Eulah PontMurphy willing to take the case on but also agrees that is not possible to admit her for an elective hip replacement she needs an extensive workup had a time. Patient will need either go home or go to rehabilitation. At Dr. Greig RightMurphy's requested make arrangements with interventional radiology for them to do a cortisone injection which will probably happen tomorrow. Social worker involved again patient may have lost their spot to go to rehabilitation today so patient will be here most likely overnight. Family is aware of the predicament and understand where the game plan is headed.   Vanetta MuldersScott Tersa Fotopoulos, MD 11/30/15 1655

## 2015-11-30 NOTE — ED Notes (Signed)
ptar her to take [facility] dinner tray arrived pt is finisheing eating

## 2015-11-30 NOTE — ED Notes (Signed)
Breakfast tray ordered 

## 2015-12-01 ENCOUNTER — Non-Acute Institutional Stay (SKILLED_NURSING_FACILITY): Payer: Medicare Other | Admitting: Internal Medicine

## 2015-12-01 ENCOUNTER — Encounter: Payer: Self-pay | Admitting: Internal Medicine

## 2015-12-01 DIAGNOSIS — M1612 Unilateral primary osteoarthritis, left hip: Secondary | ICD-10-CM

## 2015-12-01 DIAGNOSIS — K219 Gastro-esophageal reflux disease without esophagitis: Secondary | ICD-10-CM | POA: Diagnosis not present

## 2015-12-01 DIAGNOSIS — R2681 Unsteadiness on feet: Secondary | ICD-10-CM | POA: Diagnosis not present

## 2015-12-01 DIAGNOSIS — I10 Essential (primary) hypertension: Secondary | ICD-10-CM | POA: Diagnosis not present

## 2015-12-01 DIAGNOSIS — F329 Major depressive disorder, single episode, unspecified: Secondary | ICD-10-CM

## 2015-12-01 DIAGNOSIS — M792 Neuralgia and neuritis, unspecified: Secondary | ICD-10-CM

## 2015-12-01 DIAGNOSIS — I509 Heart failure, unspecified: Secondary | ICD-10-CM

## 2015-12-01 DIAGNOSIS — E038 Other specified hypothyroidism: Secondary | ICD-10-CM

## 2015-12-01 NOTE — Progress Notes (Signed)
[facility]  PCP: Pcp Not In System   Code Status: Full Code  Goals of care: Advanced Directive information Advanced Directives 11/29/2015  Does patient have an advance directive? No  Would patient like information on creating an advanced directive? No - patient declined information       Extended Emergency Contact Information Primary Emergency Contact: Jari FavreAtkinson,Rhonda Address: [address]          [place] Home Phone: [phone] Work Phone: [phone] Mobile Phone: [phone] Relation: Daughter Secondary Emergency Contact: Cord,Susan  [place] Home Phone: [phone] Relation: Daughter   No Known Allergies  Chief Complaint  Patient presents with  . New Admit To SNF    New Admission Visit     HPI:  Patient is a 82 y.o. female seen today for short term rehabilitation post ED visit with left hip pain. She was found to have severe left hip OA and needs hip surgery. She is to see Dr Eulah PontMurphy orthopedic on 12/06/15 for further evaluation. She is seen in her room today with her daughter present.   Review of Systems:  Constitutional: Negative for fever, chills, diaphoresis.  HENT: Negative for headache, congestion, nasal discharge Eyes: Negative for blurred vision, double vision and discharge.  Respiratory: Negative for shortness of breath and wheezing. Positive for occasional dry cough.   Cardiovascular: Negative for chest pain, palpitations, leg swelling.  Gastrointestinal: Negative for heartburn, vomiting, abdominal pain. Last bowel movement was yesterday. Genitourinary: Negative for dysuria.  Musculoskeletal: Negative for back pain, fall in the facility. left hip pain with movement.  Skin: Negative for itching, rash.  Neurological: Negative for dizziness. Positive for neuropathic pain to her feet Psychiatric/Behavioral: Negative for depression   Past Medical History:  Diagnosis Date  .  Aneurysm (HCC)   . Arthritis    No past surgical history on file. Social History:   reports that she has never smoked. She has never used smokeless tobacco. She reports that she does not drink alcohol or use drugs.  No family history on file.  Medications:   Medication List       Accurate as of 12/01/15  3:50 PM. Always use your most recent med list.          bimatoprost 0.01 % Soln Commonly known as:  LUMIGAN Place 1 drop into both eyes at bedtime.   furosemide 20 MG tablet Commonly known as:  LASIX Take 20-40 mg by mouth See admin instructions. Takes 2 tabs in am and 1 tab in pm   gabapentin 300 MG capsule Commonly known as:  NEURONTIN Take 900 mg by mouth 3 (three) times daily.   levothyroxine 88 MCG tablet Commonly known as:  SYNTHROID, LEVOTHROID Take 88 mcg by mouth daily before breakfast.   metoprolol 100 MG tablet Commonly known as:  LOPRESSOR Take 50 mg by mouth 2 (two) times daily.   NON FORMULARY Take 1 capsule by mouth daily. "Omega Q" omega 3 and coq10   NON FORMULARY Take 3 capsules by mouth daily. "Optimal V" heart, eyes, skin and lungs   NON FORMULARY Take 2 capsules by mouth daily. "optimal M" bones, nerves, muscles   NON FORMULARY Take 2 capsules by mouth daily. "Magnical D" bones and muscles   NON FORMULARY Take 2 capsules by mouth daily. "Rejuveniix" energy and mental alertness   ranitidine 300 MG tablet Commonly known as:  ZANTAC Take 300 mg by mouth 2 (two) times daily.  sertraline 100 MG tablet Commonly known as:  ZOLOFT Take 150 mg by mouth daily.       Immunizations:  There is no immunization history on file for this patient.   Physical Exam:  Vitals:   12/01/15 1545  BP: (!) 138/53  Pulse: 72  Resp: 20  Temp: 98 F (36.7 C)  TempSrc: Oral  SpO2: 96%  Weight: 207 lb (93.9 kg)  Height: 5\' 5"  (1.651 m)   Body mass index is 34.45 kg/m.  General- elderly female, obese, in no acute distress Head-  normocephalic, atraumatic Nose- no maxillary or frontal sinus tenderness, no nasal discharge Throat- moist mucus membrane  Eyes- PERRLA, EOMI, no pallor, no icterus Neck- no cervical lymphadenopathy Cardiovascular- normal s1,s2, no murmur Respiratory- bilateral clear to auscultation, no wheeze, no rhonchi, no crackles, no use of accessory muscles Abdomen- bowel sounds present, soft, non tender Musculoskeletal- able to move all 4 extremities, limited left hip ROM Neurological- alert and oriented to person, place and time Skin- warm and dry Psychiatry- normal mood and affect    Labs reviewed: Basic Metabolic Panel:  Recent Labs  16/10/96 2325  NA 142  K 4.7  CL 106  CO2 27  GLUCOSE 102*  BUN 12  CREATININE 0.92  CALCIUM 9.5   Liver Function Tests: No results for input(s): AST, ALT, ALKPHOS, BILITOT, PROT, ALBUMIN in the last 8760 hours. No results for input(s): LIPASE, AMYLASE in the last 8760 hours. No results for input(s): AMMONIA in the last 8760 hours. CBC:  Recent Labs  11/29/15 2325  WBC 8.5  NEUTROABS 6.2  HGB 13.2  HCT 40.6  MCV 91.2  PLT 227   Cardiac Enzymes: No results for input(s): CKTOTAL, CKMB, CKMBINDEX, TROPONINI in the last 8760 hours. BNP: Invalid input(s): POCBNP CBG: No results for input(s): GLUCAP in the last 8760 hours.  Radiological Exams: Ct Hip Left Wo Contrast  Result Date: 11/30/2015 CLINICAL DATA:  82 year old female with left hip pain.  No trauma. EXAM: CT OF THE LEFT HIP WITHOUT CONTRAST TECHNIQUE: Multidetector CT imaging of the left hip was performed according to the standard protocol. Multiplanar CT image reconstructions were also generated. COMPARISON:  None. FINDINGS: Bones/Joint/Cartilage There is no acute fracture or dislocation. There is advanced osteopenia. There is severe osteoarthritic changes of the left hip cortical sclerosis and irregularity and subcortical cysts. There is near complete loss of joint space. There is  heterotopic bone formation along the left femoral head which may be related to prior fracture. No effusion or fluid collection. Ligaments Suboptimally assessed by CT. Muscles and Tendons Grossly unremarkable.  No inflammation or fluid collection. Soft tissues There is extensive sigmoid diverticulosis. Partially visualized thickening of the sigmoid colon with surrounding stranding is concerning for acute diverticulitis. Clinical correlation is recommended. CT of the abdomen pelvis may provide better evaluation. IMPRESSION: No acute fracture or dislocation. Advanced osteopenia with severe osteoarthritic changes of the left hip and loss of joint space. Sigmoid diverticulosis with findings concerning for acute diverticulitis. Clinical correlation is recommended. CT of the abdomen pelvis is recommended for further evaluation. Electronically Signed   By: Elgie Collard M.D.   On: 11/30/2015 00:13    Assessment/Plan  Unsteady gait With left hip severe OA. Will have her work with physical therapy and occupational therapy team to help with gait training and muscle strengthening exercises.fall precautions. Skin care. Encourage to be out of bed.   Left hip OA Will need to see orthopedic for evaluation for hip arthroplasty. Not on  any pain medication. Will have her on tylenol 650 mg q 4h prn moderate pain and norco 5-325 q4h prn severe pain until seen by orthopedic. Has appointment at 3 pm 12/06/15 with Dr Eulah Pont  Hypertension Monitor BP, continue metorpolol 50 mg bid. Check bmp  Neuropathic pain Continue neurontin 900 mg tid  chf Unspecified, currently on lasix 40 mg in am and 20 mg in pm and metoprolol. Monitor bmp  Hypothyroidism Continue levothyroxine, no changes made  gerd Continue ranitidne 300 mg bid  Chronic depression Continue her current regimen of zoloft     Goals of care: short term rehabilitation   Labs/tests ordered: cbc, bmp 12/05/15  Family/ staff Communication: reviewed  care plan with patient, her daughter and nursing supervisor    Oneal Grout, MD Internal Medicine [facility] [address] [place], [place] [address] Cell Phone (Monday-Friday 8 am - 5 pm): [phone] On Call: [phone] and follow prompts after 5 pm and on weekends Office Phone: [phone] Office Fax: [phone]

## 2015-12-05 ENCOUNTER — Non-Acute Institutional Stay (SKILLED_NURSING_FACILITY): Payer: Medicare Other | Admitting: Family

## 2015-12-05 DIAGNOSIS — E039 Hypothyroidism, unspecified: Secondary | ICD-10-CM | POA: Insufficient documentation

## 2015-12-05 DIAGNOSIS — M792 Neuralgia and neuritis, unspecified: Secondary | ICD-10-CM

## 2015-12-05 DIAGNOSIS — R609 Edema, unspecified: Secondary | ICD-10-CM

## 2015-12-05 MED ORDER — GABAPENTIN 300 MG PO CAPS: 900.0000 mg | ORAL_CAPSULE | Freq: Three times a day (TID) | ORAL | Status: DC | PRN

## 2015-12-05 NOTE — Progress Notes (Signed)
[facility] Room Number: 902 P  Place of Service:  SNF (31) Provider:  Ernie Sagrero FNP-C   Pcp Not In System  Patient Care Team: Pcp Not In System as PCP - General  Extended Emergency Contact Information Primary Emergency Contact: Jari Favre Address: [address]          [place] Home Phone: [phone] Work Phone: [phone] Mobile Phone: [phone] Relation: Daughter Secondary Emergency Contact: Cord,Susan  [place] Home Phone: [phone] Relation: Daughter  Code Status:  Full Code  Goals of care: Advanced Directive information Advanced Directives 11/29/2015  Does patient have an advance directive? No  Would patient like information on creating an advanced directive? No - patient declined information     Chief Complaint  Patient presents with  . Acute Visit    edema    HPI:  Pt is a 82 y.o. female seen today at [facility] for an acute visit for evaluation of edema and medication. She has a medical history of HTN, Hypothyroidism, OA, Hip pain among other conditions. She is seen in her room today with daughter at bedside. She continues to exercise with PT/OT. Patient and daughter requests for gabapentin to be changed to as needed states she takes it as needed at home.Daughter also states patient's leg edema seems to be increased this visit " I think because she is not drinking her water". She is currently on Furosemide 20 mg Tablet. Facility staff reports no new concerns.    Past Medical History:  Diagnosis Date  . Aneurysm (HCC)   . Arthritis    No past surgical history on file.  No Known Allergies    Medication List       Accurate as of 12/05/15  5:22 PM. Always use your most recent med list.          bimatoprost 0.01 % Soln Commonly known as:  LUMIGAN Place 1 drop into both eyes at bedtime.   furosemide 20 MG  tablet Commonly known as:  LASIX Take 20-40 mg by mouth See admin instructions. Takes 2 tabs in am and 1 tab in pm   gabapentin 300 MG capsule Commonly known as:  NEURONTIN Take 3 capsules (900 mg total) by mouth 3 (three) times daily as needed.   levothyroxine 88 MCG tablet Commonly known as:  SYNTHROID, LEVOTHROID Take 88 mcg by mouth daily before breakfast.   metoprolol 100 MG tablet Commonly known as:  LOPRESSOR Take 50 mg by mouth 2 (two) times daily.   NON FORMULARY Take 1 capsule by mouth daily. "Omega Q" omega 3 and coq10   NON FORMULARY Take 3 capsules by mouth daily. "Optimal V" heart, eyes, skin and lungs   NON FORMULARY Take 2 capsules by mouth daily. "optimal M" bones, nerves, muscles   NON FORMULARY Take 2 capsules by mouth daily. "Magnical D" bones and muscles   NON FORMULARY Take 2 capsules by mouth daily. "Rejuveniix" energy and mental alertness   ranitidine 300 MG tablet Commonly known as:  ZANTAC Take 300 mg by mouth 2 (two) times daily.   sertraline 100 MG tablet Commonly known as:  ZOLOFT Take 150 mg by mouth daily.       Review of Systems  Constitutional: Negative for activity change, appetite change, chills, fatigue and fever.  HENT: Negative for congestion, rhinorrhea, sinus pressure, sneezing and sore throat.   Eyes: Negative.   Cardiovascular:  Positive for leg swelling. Negative for chest pain and palpitations.  Gastrointestinal: Positive for nausea. Negative for abdominal distention, abdominal pain, constipation, diarrhea and vomiting.       Large LBM 12/05/2015.   Genitourinary: Negative for dysuria, flank pain, frequency and urgency.  Musculoskeletal: Positive for gait problem.       Hip pain   Skin: Negative for color change, pallor, rash and wound.  Neurological: Negative for dizziness, seizures, light-headedness and headaches.  Hematological: Does not bruise/bleed easily.  Psychiatric/Behavioral: Negative for agitation, confusion,  hallucinations and sleep disturbance. The patient is not nervous/anxious.      There is no immunization history on file for this patient. Pertinent  Health Maintenance Due  Topic Date Due  . DEXA SCAN  05/24/1994  . PNA vac Low Risk Adult (1 of 2 - PCV13) 05/24/1994  . INFLUENZA VACCINE  09/06/2015   No flowsheet data found. Functional Status Survey:    Vitals:   12/05/15 1130  BP: (!) 148/75  Pulse: 70  Resp: 18  Temp: 97.9 F (36.6 C)  SpO2: 95%  Weight: 207 lb (93.9 kg)  Height: 5\' 5"  (1.651 m)   Body mass index is 34.45 kg/m. Physical Exam  Constitutional: She is oriented to person, place, and time. She appears well-developed and well-nourished.  HENT:  Head: Normocephalic.  Mouth/Throat: Oropharynx is clear and moist. No oropharyngeal exudate.  Eyes: Conjunctivae and EOM are normal. Pupils are equal, round, and reactive to light. Right eye exhibits no discharge. Left eye exhibits no discharge. No scleral icterus.  Neck: Normal range of motion. No JVD present. No thyromegaly present.  Cardiovascular: Normal rate, regular rhythm, normal heart sounds and intact distal pulses.  Exam reveals no gallop and no friction rub.   No murmur heard. Pulmonary/Chest: Effort normal and breath sounds normal. No respiratory distress. She has no wheezes. She has no rales.  Abdominal: Soft. Bowel sounds are normal. She exhibits no distension. There is no tenderness. There is no guarding.  Genitourinary:  Genitourinary Comments: Continent B/B   Musculoskeletal:  Moves x 4 extremities. Unsteady gait. 1-2 + edema to lower extremities.   Lymphadenopathy:    She has no cervical adenopathy.  Neurological: She is oriented to person, place, and time.  Skin: Skin is warm and dry. No rash noted. No erythema. No pallor.  Psychiatric: She has a normal mood and affect.    Labs reviewed:  Recent Labs  11/29/15 2325  NA 142  K 4.7  CL 106  CO2 27  GLUCOSE 102*  BUN 12  CREATININE 0.92    CALCIUM 9.5    Recent Labs  11/29/15 2325  WBC 8.5  NEUTROABS 6.2  HGB 13.2  HCT 40.6  MCV 91.2  PLT 227   Lab Results  Component Value Date   TSH 1.563 02/07/2010   Lab Results  Component Value Date   HGBA1C  05/21/2007    5.6 (NOTE)   The ADA recommends the following therapeutic goals for glycemic   control related to Hgb A1C measurement:   Goal of Therapy:   < 7.0% Hgb A1C   Action Suggested:  > 8.0% Hgb A1C   Ref:  Diabetes Care, 22, Suppl. 1, 1999   Assessment/Plan 1. Peripheral edema Bilateral lower extremities 1-2 + edema. Continue on Furosemide 20 mg Tablet. Apply Bilaterla knee high Ted hose on in the morning and off at bedtime. Continue to monitor BMP.   2. Neuropathic pain Currently on Gabapentin 900 mg three times  daily. Patient and patient's daughter request med changed to PRN. Takes as needed at home. Change Gabapentin 300 mg capsule ( 900 mg )  three times daily as needed. Hold for sedation.     Family/ staff Communication: Reviewed plan of care with patient, patient's daughter and facility Nurse supervisor.   Labs/tests ordered:  None

## 2015-12-07 ENCOUNTER — Non-Acute Institutional Stay (SKILLED_NURSING_FACILITY): Payer: Medicare Other | Admitting: Family

## 2015-12-07 ENCOUNTER — Encounter: Payer: Self-pay | Admitting: Family

## 2015-12-07 DIAGNOSIS — E039 Hypothyroidism, unspecified: Secondary | ICD-10-CM | POA: Diagnosis not present

## 2015-12-07 DIAGNOSIS — F329 Major depressive disorder, single episode, unspecified: Secondary | ICD-10-CM

## 2015-12-07 DIAGNOSIS — M25552 Pain in left hip: Secondary | ICD-10-CM | POA: Diagnosis not present

## 2015-12-07 DIAGNOSIS — F32A Depression, unspecified: Secondary | ICD-10-CM

## 2015-12-07 DIAGNOSIS — I10 Essential (primary) hypertension: Secondary | ICD-10-CM | POA: Diagnosis not present

## 2015-12-07 DIAGNOSIS — G629 Polyneuropathy, unspecified: Secondary | ICD-10-CM

## 2015-12-07 DIAGNOSIS — R600 Localized edema: Secondary | ICD-10-CM

## 2015-12-07 DIAGNOSIS — K219 Gastro-esophageal reflux disease without esophagitis: Secondary | ICD-10-CM | POA: Diagnosis not present

## 2015-12-08 DIAGNOSIS — I10 Essential (primary) hypertension: Secondary | ICD-10-CM | POA: Insufficient documentation

## 2015-12-08 NOTE — Progress Notes (Signed)
Location:  [facility] Room Number: 902-P Place of Service:  SNF (31)  Provider: Richarda Blade FNP-C   PCP: Pcp Not In System Patient Care Team: Pcp Not In System as PCP - General  Extended Emergency Contact Information Primary Emergency Contact: Jari Favre Address: [address]          [place] Home Phone: [phone] Work Phone: [phone] Mobile Phone: [phone] Relation: Daughter Secondary Emergency Contact: Cord,Susan  [place] Home Phone: [phone] Relation: Daughter  Code Status: Full Code  Goals of care:  Advanced Directive information Advanced Directives 11/29/2015  Does patient have an advance directive? No  Would patient like information on creating an advanced directive? No - patient declined information     No Known Allergies  Chief Complaint  Patient presents with  . Discharge Note    Discharge Visit    HPI:  82 y.o. female seen today at [facility] for discharge home.She was here for short term rehabilitation post ED visit 11/29/2015 with left hip pain. She was found to have severe left hip OA and needs hip surgery. She has a medical history of HTN, Hypothyroidism, OA, Hip pain, Depression among other conditions. She is seen in her room today. She was see Dr Eulah Pont orthopedic on 12/06/15 for end stage DJD of left hip waiting for medical clearance for surgery. Will be discharge home to save rehab days for post-op. She denies any new  acute issues this visit. Facility staff reports no new concerns. No Home health services or DME required for now.     Past Medical History:  Diagnosis Date  . Aneurysm (HCC)   . Arthritis     No past surgical history on file.    reports that she has never smoked. She has never used smokeless tobacco. She reports that she does not drink alcohol or use drugs. Social History   Social History  .  Marital status: Widowed    Spouse name: N/A  . Number of children: N/A  . Years of education: N/A   Occupational History  . Not on file.   Social History Main Topics  . Smoking status: Never Smoker  . Smokeless tobacco: Never Used  . Alcohol use No  . Drug use: No  . Sexual activity: Not on file   Other Topics Concern  . Not on file   Social History Narrative  . No narrative on file   Functional Status Survey:    No Known Allergies  Pertinent  Health Maintenance Due  Topic Date Due  . DEXA SCAN  05/24/1994  . PNA vac Low Risk Adult (1 of 2 - PCV13) 05/24/1994  . INFLUENZA VACCINE  09/06/2015    Medications:   Medication List       Accurate as of 12/07/15 11:59 PM. Always use your most recent med list.          acetaminophen 325 MG tablet Commonly known as:  TYLENOL Take 650 mg by mouth every 4 (four) hours as needed for moderate pain.   furosemide 20 MG tablet Commonly known as:  LASIX Take 20-40 mg by mouth See admin instructions. Takes 2 tabs in am and 1 tab in pm   gabapentin 300 MG capsule Commonly known as:  NEURONTIN Take 900 mg by mouth daily as needed.   HYDROcodone-acetaminophen 5-325 MG tablet Commonly known as:  NORCO/VICODIN Take 1 tablet by mouth every 4 (  four) hours as needed for moderate pain.   latanoprost 0.005 % ophthalmic solution Commonly known as:  XALATAN Place 1 drop into both eyes at bedtime.   levothyroxine 88 MCG tablet Commonly known as:  SYNTHROID, LEVOTHROID Take 88 mcg by mouth daily before breakfast.   metoprolol 100 MG tablet Commonly known as:  LOPRESSOR Take 50 mg by mouth 2 (two) times daily.   NON FORMULARY Take 1 capsule by mouth daily. "Omega Q" omega 3 and coq10   NON FORMULARY Take 3 capsules by mouth daily. "Optimal V" heart, eyes, skin and lungs   NON FORMULARY Take 2 capsules by mouth daily. "optimal M" bones, nerves, muscles   NON FORMULARY Take 2 capsules by mouth daily. "Magnical D" bones and  muscles   NON FORMULARY Take 2 capsules by mouth daily. "Rejuveniix" energy and mental alertness   nystatin cream Commonly known as:  MYCOSTATIN Apply 1 application topically daily. Stop date 12/13/15   ondansetron 4 MG tablet Commonly known as:  ZOFRAN Take 4 mg by mouth every 6 (six) hours as needed for nausea or vomiting.   ranitidine 300 MG tablet Commonly known as:  ZANTAC Take 300 mg by mouth 2 (two) times daily.   sertraline 100 MG tablet Commonly known as:  ZOLOFT Take 150 mg by mouth daily.       Review of Systems  Constitutional: Negative for activity change, appetite change, chills, fatigue and fever.  HENT: Negative for congestion, rhinorrhea, sinus pressure, sneezing and sore throat.   Eyes: Negative.   Respiratory: Negative for cough, chest tightness, shortness of breath and wheezing.   Cardiovascular: Positive for leg swelling. Negative for chest pain and palpitations.  Gastrointestinal: Negative for abdominal distention, abdominal pain, constipation, diarrhea, nausea and vomiting.  Endocrine: Negative.   Genitourinary: Negative for dysuria, flank pain, frequency and urgency.  Musculoskeletal: Positive for gait problem.       Left Hip pain   Skin: Negative for color change, pallor, rash and wound.  Neurological: Negative for dizziness, seizures, light-headedness and headaches.  Hematological: Does not bruise/bleed easily.  Psychiatric/Behavioral: Negative for agitation, confusion, hallucinations and sleep disturbance. The patient is not nervous/anxious.     Vitals:   12/07/15 1050  BP: 137/64  Pulse: 64  Resp: 20  Temp: (!) 96.7 F (35.9 C)  TempSrc: Oral  SpO2: 93%  Weight: 207 lb (93.9 kg)  Height: 5\' 5"  (1.651 m)   Body mass index is 34.45 kg/m. Physical Exam  Constitutional: She is oriented to person, place, and time. She appears well-developed and well-nourished.  HENT:  Head: Normocephalic.  Mouth/Throat: Oropharynx is clear and moist. No  oropharyngeal exudate.  Eyes: Conjunctivae and EOM are normal. Pupils are equal, round, and reactive to light. Right eye exhibits no discharge. Left eye exhibits no discharge. No scleral icterus.  Neck: Normal range of motion. No JVD present. No thyromegaly present.  Cardiovascular: Normal rate, regular rhythm, normal heart sounds and intact distal pulses.  Exam reveals no gallop and no friction rub.   No murmur heard. Pulmonary/Chest: Effort normal and breath sounds normal. No respiratory distress. She has no wheezes. She has no rales.  Abdominal: Soft. Bowel sounds are normal. She exhibits no distension. There is no tenderness. There is no guarding.  Genitourinary:  Genitourinary Comments: Continent B/B   Musculoskeletal:  Moves x 4 extremities. Unsteady gait. 1-2 + edema to lower extremities.   Lymphadenopathy:    She has no cervical adenopathy.  Neurological: She is oriented to  person, place, and time.  Skin: Skin is warm and dry. No rash noted. No erythema. No pallor.  Psychiatric: She has a normal mood and affect.    Labs reviewed: Basic Metabolic Panel:  Recent Labs  16/11/9608/24/17 2325  NA 142  K 4.7  CL 106  CO2 27  GLUCOSE 102*  BUN 12  CREATININE 0.92  CALCIUM 9.5   CBC:  Recent Labs  11/29/15 2325  WBC 8.5  NEUTROABS 6.2  HGB 13.2  HCT 40.6  MCV 91.2  PLT 227   Assessment/Plan:   1. Benign essential HTN B/p stable. Continue on metoprolol. Monitor BMP  2. Hypothyroidism, unspecified type Continue on Levothyroxine. TSH level with PCP  3. Pain of left hip joint Seen by Dr Eulah PontMurphy orthopedic on 12/06/15 for end stage DJD of left hip.Awaiting medical clearance for left hip arthroplasty. Continue current pain regimen.   4. Neuropathy (HCC) Continue Gabapentin.   5. Localized edema Continue Furosemide. Apply knee high Ted hose on in AM and off at bedtime.   6. Depression, unspecified depression type Stable. Continue on zoloft. Monitor for mood changes.    7. Gastroesophageal reflux disease without esophagitis Continue on Zantac.     Patient is being discharged with the following home health services:   -None required until pot-op  Patient is being discharged with the following durable medical equipment:   -None required.  Patient has been advised to f/u with their PCP in 1-2 weeks to for a transitions of care visit.  Social services at their facility was responsible for arranging this appointment.  Pt was provided with adequate prescriptions of noncontrolled medications to reach the scheduled appointment .  For controlled substances, a limited supply was provided as appropriate for the individual patient.  If the pt normally receives these medications from a pain clinic or has a contract with another physician, these medications should be received from that clinic or physician only).    Future labs/tests needed: None

## 2016-01-24 NOTE — H&P (Signed)
TOTAL HIP ADMISSION H&P  Patient is admitted for left total hip arthroplasty.  Subjective:  Chief Complaint: left hip pain  HPI: [name], 82 y.o. female, has a history of pain and functional disability in the left hip(s) due to arthritis and patient has failed non-surgical conservative treatments for greater than 12 weeks to include NSAID's and/or analgesics and corticosteriod injections.  Onset of symptoms was gradual starting >10 years ago with gradually worsening course since that time.The patient noted no past surgery on the left hip(s).  Patient currently rates pain in the left hip at 10 out of 10 with activity. Patient has night pain, worsening of pain with activity and weight bearing and trendelenberg gait. Patient has evidence of subchondral sclerosis and joint space narrowing by imaging studies. This condition presents safety issues increasing the risk of falls.  There is no current active infection.  Patient Active Problem List   Diagnosis Date Noted  . Benign essential HTN 12/08/2015  . Hypothyroidism 12/05/2015  . Osteoarthritis of left hip 10/20/2012  . Hip pain 06/17/2012  . History of total hip arthroplasty 06/17/2012   Past Medical History:  Diagnosis Date  . Aneurysm (HCC)   . Arthritis     No past surgical history on file.  No prescriptions prior to admission.   No Known Allergies  Social History  Substance Use Topics  . Smoking status: Never Smoker  . Smokeless tobacco: Never Used  . Alcohol use No    No family history on file.   Review of Systems  Constitutional: Negative.   HENT: Negative.   Eyes: Negative.   Respiratory: Negative.   Cardiovascular: Negative.   Gastrointestinal: Negative.   Genitourinary: Positive for frequency and urgency. Negative for dysuria and hematuria.  Musculoskeletal: Positive for joint pain.  Skin: Negative.   Neurological: Negative.   Endo/Heme/Allergies: Negative.   Psychiatric/Behavioral: Negative.      Objective:  Physical Exam  Constitutional: She is oriented to person, place, and time. She appears well-developed and well-nourished.  HENT:  Head: Normocephalic and atraumatic.  Eyes: EOM are normal. Pupils are equal, round, and reactive to light.  Neck: Normal range of motion. Neck supple.  Cardiovascular: Normal rate and regular rhythm.   Respiratory: Effort normal and breath sounds normal.  GI: Soft. Bowel sounds are normal.  Musculoskeletal:  She can barely ambulate.  Really cannot put much weight down on her left hip at all.  There is virtually no motion.  Markedly positive log roll.  Neurological: She is alert and oriented to person, place, and time.  Skin: Skin is warm and dry.  Psychiatric: She has a normal mood and affect. Her behavior is normal. Judgment and thought content normal.    Vital signs in last 24 hours:    Labs:   Estimated body mass index is 34.45 kg/m as calculated from the following:   Height as of 12/07/15: 5\' 5"  (1.651 m).   Weight as of 12/07/15: 93.9 kg (207 lb).   Imaging Review Plain radiographs demonstrate severe degenerative joint disease of the left hip(s). The bone quality appears to be fair for age and reported activity level.  Assessment/Plan:  End stage arthritis, left hip(s)  The patient history, physical examination, clinical judgement of the provider and imaging studies are consistent with end stage degenerative joint disease of the left hip(s) and total hip arthroplasty is deemed medically necessary. The treatment options including medical management, injection therapy, arthroscopy and arthroplasty were discussed at length. The risks and  benefits of total hip arthroplasty were presented and reviewed. The risks due to aseptic loosening, infection, stiffness, dislocation/subluxation,  thromboembolic complications and other imponderables were discussed.  The patient acknowledged the explanation, agreed to proceed with the plan and  consent was signed. Patient is being admitted for inpatient treatment for surgery, pain control, PT, OT, prophylactic antibiotics, VTE prophylaxis, progressive ambulation and ADL's and discharge planning.The patient is planning to be discharged home with home health services

## 2016-01-25 NOTE — Pre-Procedure Instructions (Addendum)
[name]  01/25/2016      [facility] [place] - [id number] [place] [place] [id number] Phone: [phone] Fax: [phone]  [facility] - [place] - [address] [place] [place] [id number] Phone: [phone] Fax: [phone]    Your procedure is scheduled on Wednesday January 3  [facility] at 7:50 A.M.  Call this number if you have problems the morning of surgery:  [phone]   Remember:  Do not eat food or drink liquids after midnight.  Take these medicines the morning of surgery with A SIP OF WATER:levothyroxine (synthroid), metoprolol (lopressor), ranitidine (zantac), gabapentin (neurontin) if needed, acetaminophen (tylenol) or hydrocodone (Norco) for pain if needed  7 days prior to surgery STOP taking any Aspirin, Aleve, Naproxen, Ibuprofen, Motrin, Advil, Goody's, BC's, all herbal medications, fish oil, and all vitamins   Do not wear jewelry, make-up or nail polish.  Do not wear lotions, powders, or perfumes, or deoderant.  Do not shave 48 hours prior to surgery.  Men may shave face and neck.  Do not bring valuables to the hospital.  [facility] is not responsible for any belongings or valuables.  Contacts, dentures or bridgework may not be worn into surgery.  Leave your suitcase in the car.  After surgery it may be brought to your room.  For patients admitted to the hospital, discharge time will be determined by your treatment team.  Patients discharged the day of surgery will not be allowed to drive home.   Special instructions:    [facility]- Preparing For Surgery  Before surgery, you can play an important role. Because skin is not sterile, your skin needs to be as free of germs as possible. You can reduce the number of germs on your skin by washing with CHG (chlorahexidine gluconate) Soap before surgery.  CHG is an antiseptic  cleaner which kills germs and bonds with the skin to continue killing germs even after washing.  Please do not use if you have an allergy to CHG or antibacterial soaps. If your skin becomes reddened/irritated stop using the CHG.  Do not shave (including legs and underarms) for at least 48 hours prior to first CHG shower. It is OK to shave your face.  Please follow these instructions carefully.   1. Shower the NIGHT BEFORE SURGERY and the MORNING OF SURGERY with CHG.   2. If you chose to wash your hair, wash your hair first as usual with your normal shampoo.  3. After you shampoo, rinse your hair and body thoroughly to remove the shampoo.  4. Use CHG as you would any other liquid soap. You can apply CHG directly to the skin and wash gently with a scrungie or a clean washcloth.   5. Apply the CHG Soap to your body ONLY FROM THE NECK DOWN.  Do not use on open wounds or open sores. Avoid contact with your eyes, ears, mouth and genitals (private parts). Wash genitals (private parts) with your normal soap.  6. Wash thoroughly, paying special attention to the area where your surgery will be performed.  7. Thoroughly rinse your body with warm water from the neck down.  8. DO NOT shower/wash with your normal soap after using and rinsing off the CHG Soap.  9. Pat yourself dry with a CLEAN TOWEL.   10. Wear CLEAN PAJAMAS   11.  Place CLEAN SHEETS on your bed the night of your first shower and DO NOT SLEEP WITH PETS.    Day of Surgery: Do not apply any deodorants/lotions. Please wear clean clothes to the hospital/surgery center.      Please read over the following fact sheets that you were given. Total Joint Packet and MRSA Information

## 2016-01-26 ENCOUNTER — Encounter (HOSPITAL_COMMUNITY)
Admission: RE | Admit: 2016-01-26 | Discharge: 2016-01-26 | Disposition: A | Discharge status: Home / Self Care | Payer: Medicare Other | Source: Ambulatory Visit | Attending: Orthopedic Surgery | Admitting: Orthopedic Surgery

## 2016-01-26 ENCOUNTER — Encounter (HOSPITAL_COMMUNITY): Payer: Self-pay

## 2016-01-26 DIAGNOSIS — Z96649 Presence of unspecified artificial hip joint: Secondary | ICD-10-CM | POA: Insufficient documentation

## 2016-01-26 DIAGNOSIS — Z01810 Encounter for preprocedural cardiovascular examination: Secondary | ICD-10-CM | POA: Insufficient documentation

## 2016-01-26 DIAGNOSIS — M1612 Unilateral primary osteoarthritis, left hip: Secondary | ICD-10-CM | POA: Insufficient documentation

## 2016-01-26 DIAGNOSIS — I10 Essential (primary) hypertension: Secondary | ICD-10-CM | POA: Insufficient documentation

## 2016-01-26 DIAGNOSIS — E039 Hypothyroidism, unspecified: Secondary | ICD-10-CM | POA: Insufficient documentation

## 2016-01-26 DIAGNOSIS — Z01812 Encounter for preprocedural laboratory examination: Secondary | ICD-10-CM | POA: Diagnosis present

## 2016-01-26 DIAGNOSIS — M25559 Pain in unspecified hip: Secondary | ICD-10-CM | POA: Diagnosis not present

## 2016-01-26 HISTORY — DX: Chronic kidney disease, unspecified: N18.9

## 2016-01-26 HISTORY — DX: Hypothyroidism, unspecified: E03.9

## 2016-01-26 HISTORY — DX: Polyneuropathy, unspecified: G62.9

## 2016-01-26 HISTORY — DX: Gastro-esophageal reflux disease without esophagitis: K21.9

## 2016-01-26 HISTORY — DX: Personal history of other diseases of the digestive system: Z87.19

## 2016-01-26 HISTORY — DX: Cardiac murmur, unspecified: R01.1

## 2016-01-26 HISTORY — DX: Essential (primary) hypertension: I10

## 2016-01-26 LAB — BASIC METABOLIC PANEL
ANION GAP: 10 (ref 5–15)
BUN: 18 mg/dL (ref 6–20)
CALCIUM: 9.6 mg/dL (ref 8.9–10.3)
CO2: 25 mmol/L (ref 22–32)
Chloride: 104 mmol/L (ref 101–111)
Creatinine, Ser: 1.17 mg/dL — ABNORMAL HIGH (ref 0.44–1.00)
GFR, EST AFRICAN AMERICAN: 47 mL/min — AB (ref >60)
GFR, EST NON AFRICAN AMERICAN: 41 mL/min — AB (ref >60)
GLUCOSE: 102 mg/dL — AB (ref 65–99)
POTASSIUM: 4.7 mmol/L (ref 3.5–5.1)
Sodium: 139 mmol/L (ref 135–145)

## 2016-01-26 LAB — CBC
HEMATOCRIT: 37.5 % (ref 36.0–46.0)
Hemoglobin: 12.4 g/dL (ref 12.0–15.0)
MCH: 30.3 pg (ref 26.0–34.0)
MCHC: 33.1 g/dL (ref 30.0–36.0)
MCV: 91.7 fL (ref 78.0–100.0)
Platelets: 273 10*3/uL (ref 150–400)
RBC: 4.09 MIL/uL (ref 3.87–5.11)
RDW: 14.7 % (ref 11.5–15.5)
WBC: 8.5 10*3/uL (ref 4.0–10.5)

## 2016-01-26 LAB — SURGICAL PCR SCREEN
MRSA, PCR: NEGATIVE
STAPHYLOCOCCUS AUREUS: NEGATIVE

## 2016-01-26 LAB — TYPE AND SCREEN
ABO/RH(D): O POS
Antibody Screen: NEGATIVE

## 2016-02-07 ENCOUNTER — Encounter (HOSPITAL_COMMUNITY): Payer: Self-pay | Admitting: Anesthesiology

## 2016-02-07 MED ORDER — CEFAZOLIN SODIUM-DEXTROSE 2-4 GM/100ML-% IV SOLN
2.0000 g | INTRAVENOUS | Status: AC
  Administered 2016-02-08: 2 g via INTRAVENOUS
  Filled 2016-02-07: qty 100

## 2016-02-07 MED ORDER — LACTATED RINGERS IV SOLN
INTRAVENOUS | Status: DC
  Administered 2016-02-08: 08:00:00 via INTRAVENOUS

## 2016-02-07 MED ORDER — TRANEXAMIC ACID 1000 MG/10ML IV SOLN
1000.0000 mg | INTRAVENOUS | Status: AC
  Administered 2016-02-08: 1000 mg via INTRAVENOUS
  Filled 2016-02-07: qty 10

## 2016-02-08 ENCOUNTER — Inpatient Hospital Stay (HOSPITAL_COMMUNITY): Payer: Medicare Other

## 2016-02-08 ENCOUNTER — Encounter (HOSPITAL_COMMUNITY): Payer: Self-pay | Admitting: *Deleted

## 2016-02-08 ENCOUNTER — Inpatient Hospital Stay (HOSPITAL_COMMUNITY)
Admission: RE | Admit: 2016-02-08 | Discharge: 2016-02-10 | DRG: 470 | Disposition: A | Discharge status: Skilled Nursing Facility | Payer: Medicare Other | Source: Ambulatory Visit | Attending: Orthopedic Surgery | Admitting: Orthopedic Surgery

## 2016-02-08 ENCOUNTER — Inpatient Hospital Stay (HOSPITAL_COMMUNITY): Payer: Medicare Other | Admitting: Anesthesiology

## 2016-02-08 ENCOUNTER — Encounter (HOSPITAL_COMMUNITY)
Admission: RE | Disposition: A | Discharge status: Skilled Nursing Facility | Payer: Self-pay | Source: Ambulatory Visit | Attending: Orthopedic Surgery

## 2016-02-08 DIAGNOSIS — K219 Gastro-esophageal reflux disease without esophagitis: Secondary | ICD-10-CM | POA: Diagnosis present

## 2016-02-08 DIAGNOSIS — R42 Dizziness and giddiness: Secondary | ICD-10-CM | POA: Diagnosis not present

## 2016-02-08 DIAGNOSIS — M1612 Unilateral primary osteoarthritis, left hip: Secondary | ICD-10-CM | POA: Diagnosis present

## 2016-02-08 DIAGNOSIS — R110 Nausea: Secondary | ICD-10-CM | POA: Diagnosis not present

## 2016-02-08 DIAGNOSIS — E039 Hypothyroidism, unspecified: Secondary | ICD-10-CM | POA: Diagnosis present

## 2016-02-08 DIAGNOSIS — Z419 Encounter for procedure for purposes other than remedying health state, unspecified: Secondary | ICD-10-CM

## 2016-02-08 DIAGNOSIS — G629 Polyneuropathy, unspecified: Secondary | ICD-10-CM | POA: Diagnosis present

## 2016-02-08 DIAGNOSIS — I129 Hypertensive chronic kidney disease with stage 1 through stage 4 chronic kidney disease, or unspecified chronic kidney disease: Secondary | ICD-10-CM | POA: Diagnosis present

## 2016-02-08 DIAGNOSIS — N183 Chronic kidney disease, stage 3 (moderate): Secondary | ICD-10-CM | POA: Diagnosis present

## 2016-02-08 DIAGNOSIS — M79605 Pain in left leg: Secondary | ICD-10-CM

## 2016-02-08 DIAGNOSIS — Z96642 Presence of left artificial hip joint: Secondary | ICD-10-CM

## 2016-02-08 DIAGNOSIS — R262 Difficulty in walking, not elsewhere classified: Secondary | ICD-10-CM

## 2016-02-08 HISTORY — PX: TOTAL HIP ARTHROPLASTY: SHX124

## 2016-02-08 LAB — CBC
HEMATOCRIT: 35.1 % — AB (ref 36.0–46.0)
HEMOGLOBIN: 11.4 g/dL — AB (ref 12.0–15.0)
MCH: 30.1 pg (ref 26.0–34.0)
MCHC: 32.5 g/dL (ref 30.0–36.0)
MCV: 92.6 fL (ref 78.0–100.0)
Platelets: 214 10*3/uL (ref 150–400)
RBC: 3.79 MIL/uL — ABNORMAL LOW (ref 3.87–5.11)
RDW: 14.5 % (ref 11.5–15.5)
WBC: 13.1 10*3/uL — ABNORMAL HIGH (ref 4.0–10.5)

## 2016-02-08 LAB — CREATININE, SERUM
Creatinine, Ser: 0.9 mg/dL (ref 0.44–1.00)
GFR calc Af Amer: >60 mL/min (ref >60)
GFR calc non Af Amer: 56 mL/min — ABNORMAL LOW (ref >60)

## 2016-02-08 SURGERY — ARTHROPLASTY, HIP, TOTAL, ANTERIOR APPROACH
Anesthesia: Monitor Anesthesia Care | Laterality: Left

## 2016-02-08 MED ORDER — OXYCODONE HCL 5 MG PO TABS
ORAL_TABLET | ORAL | Status: AC
  Administered 2016-02-08: 10 mg via ORAL
  Filled 2016-02-08: qty 2

## 2016-02-08 MED ORDER — DIPHENHYDRAMINE HCL 12.5 MG/5ML PO ELIX: 12.5000 mg | ORAL_SOLUTION | ORAL | Status: DC | PRN

## 2016-02-08 MED ORDER — FENTANYL CITRATE (PF) 100 MCG/2ML IJ SOLN: 25.0000 ug | INTRAMUSCULAR | Status: DC | PRN

## 2016-02-08 MED ORDER — METOCLOPRAMIDE HCL 5 MG PO TABS: 5.0000 mg | ORAL_TABLET | Freq: Three times a day (TID) | ORAL | Status: DC | PRN

## 2016-02-08 MED ORDER — HYDROMORPHONE HCL 1 MG/ML IJ SOLN
INTRAMUSCULAR | Status: AC
  Administered 2016-02-08: 1 mg via INTRAVENOUS
  Filled 2016-02-08: qty 1

## 2016-02-08 MED ORDER — HYDROMORPHONE HCL 1 MG/ML IJ SOLN
0.5000 mg | INTRAMUSCULAR | Status: DC | PRN
  Administered 2016-02-08: 1 mg via INTRAVENOUS

## 2016-02-08 MED ORDER — BUPIVACAINE HCL (PF) 0.5 % IJ SOLN
INTRAMUSCULAR | Status: DC | PRN
Start: 2016-02-08 — End: 2016-02-08
  Administered 2016-02-08: 10 mL

## 2016-02-08 MED ORDER — HYDROMORPHONE HCL 2 MG/ML IJ SOLN: 0.5000 mg | INTRAMUSCULAR | Status: DC | PRN

## 2016-02-08 MED ORDER — SODIUM CHLORIDE 0.9 % IV SOLN
INTRAVENOUS | Status: DC | PRN
  Administered 2016-02-08: 10:00:00 via INTRAVENOUS

## 2016-02-08 MED ORDER — ONDANSETRON HCL 4 MG/2ML IJ SOLN
4.0000 mg | Freq: Four times a day (QID) | INTRAMUSCULAR | Status: DC | PRN
Start: 2016-02-08 — End: 2016-02-10

## 2016-02-08 MED ORDER — ACETAMINOPHEN 325 MG PO TABS
650.0000 mg | ORAL_TABLET | Freq: Four times a day (QID) | ORAL | Status: DC | PRN
  Administered 2016-02-08 – 2016-02-10 (×5): 650 mg via ORAL
  Filled 2016-02-08 (×5): qty 2

## 2016-02-08 MED ORDER — FENTANYL CITRATE (PF) 100 MCG/2ML IJ SOLN
INTRAMUSCULAR | Status: AC
  Filled 2016-02-08: qty 4

## 2016-02-08 MED ORDER — BUPIVACAINE LIPOSOME 1.3 % IJ SUSP
20.0000 mL | INTRAMUSCULAR | Status: AC
  Administered 2016-02-08: 20 mL
  Filled 2016-02-08: qty 20

## 2016-02-08 MED ORDER — ACETAMINOPHEN 650 MG RE SUPP: 650.0000 mg | Freq: Four times a day (QID) | RECTAL | Status: DC | PRN

## 2016-02-08 MED ORDER — PROPOFOL 10 MG/ML IV BOLUS
INTRAVENOUS | Status: DC | PRN
  Administered 2016-02-08: 20 mg via INTRAVENOUS

## 2016-02-08 MED ORDER — BUPIVACAINE HCL (PF) 0.5 % IJ SOLN
INTRAMUSCULAR | Status: AC
  Filled 2016-02-08: qty 30

## 2016-02-08 MED ORDER — OXYCODONE HCL 5 MG/5ML PO SOLN: 5.0000 mg | Freq: Once | ORAL | Status: DC | PRN

## 2016-02-08 MED ORDER — POLYETHYLENE GLYCOL 3350 17 G PO PACK
17.0000 g | PACK | Freq: Every day | ORAL | Status: DC | PRN
  Administered 2016-02-10: 17 g via ORAL
  Filled 2016-02-08: qty 1

## 2016-02-08 MED ORDER — BISACODYL 10 MG RE SUPP
10.0000 mg | Freq: Every day | RECTAL | Status: DC | PRN
Start: 2016-02-08 — End: 2016-02-10

## 2016-02-08 MED ORDER — CEFAZOLIN SODIUM-DEXTROSE 2-4 GM/100ML-% IV SOLN
2.0000 g | Freq: Four times a day (QID) | INTRAVENOUS | Status: AC
  Administered 2016-02-08 (×2): 2 g via INTRAVENOUS
  Filled 2016-02-08 (×2): qty 100

## 2016-02-08 MED ORDER — PHENYLEPHRINE HCL 10 MG/ML IJ SOLN
INTRAVENOUS | Status: DC | PRN
  Administered 2016-02-08: 15 ug/min via INTRAVENOUS

## 2016-02-08 MED ORDER — FENTANYL CITRATE (PF) 100 MCG/2ML IJ SOLN
INTRAMUSCULAR | Status: DC | PRN
Start: 2016-02-08 — End: 2016-02-08
  Administered 2016-02-08: 25 ug via INTRAVENOUS

## 2016-02-08 MED ORDER — ENOXAPARIN SODIUM 40 MG/0.4ML ~~LOC~~ SOLN: 40.0000 mg | SUBCUTANEOUS | 0 refills | Status: DC

## 2016-02-08 MED ORDER — LIDOCAINE 2% (20 MG/ML) 5 ML SYRINGE
INTRAMUSCULAR | Status: AC
  Filled 2016-02-08: qty 5

## 2016-02-08 MED ORDER — METOCLOPRAMIDE HCL 5 MG/ML IJ SOLN: 5.0000 mg | Freq: Three times a day (TID) | INTRAMUSCULAR | Status: DC | PRN

## 2016-02-08 MED ORDER — FUROSEMIDE 40 MG PO TABS
40.0000 mg | ORAL_TABLET | ORAL | Status: DC
  Administered 2016-02-09 – 2016-02-10 (×2): 40 mg via ORAL
  Filled 2016-02-08 (×2): qty 1

## 2016-02-08 MED ORDER — POTASSIUM CHLORIDE IN NACL 20-0.9 MEQ/L-% IV SOLN
INTRAVENOUS | Status: DC
  Administered 2016-02-08: 22:00:00 via INTRAVENOUS
  Filled 2016-02-08 (×2): qty 1000

## 2016-02-08 MED ORDER — PROPOFOL 500 MG/50ML IV EMUL
INTRAVENOUS | Status: DC | PRN
  Administered 2016-02-08: 25 ug/kg/min via INTRAVENOUS

## 2016-02-08 MED ORDER — ENOXAPARIN SODIUM 40 MG/0.4ML ~~LOC~~ SOLN
40.0000 mg | SUBCUTANEOUS | Status: DC
  Administered 2016-02-09 – 2016-02-10 (×2): 40 mg via SUBCUTANEOUS
  Filled 2016-02-08 (×2): qty 0.4

## 2016-02-08 MED ORDER — LACTATED RINGERS IV SOLN
INTRAVENOUS | Status: DC | PRN
  Administered 2016-02-08: 09:00:00 via INTRAVENOUS

## 2016-02-08 MED ORDER — FAMOTIDINE 40 MG PO TABS
40.0000 mg | ORAL_TABLET | Freq: Every day | ORAL | Status: DC
  Administered 2016-02-09: 40 mg via ORAL
  Filled 2016-02-08: qty 1
  Filled 2016-02-08: qty 2

## 2016-02-08 MED ORDER — DOCUSATE SODIUM 100 MG PO CAPS
100.0000 mg | ORAL_CAPSULE | Freq: Two times a day (BID) | ORAL | Status: DC
  Administered 2016-02-08 – 2016-02-10 (×4): 100 mg via ORAL
  Filled 2016-02-08 (×4): qty 1

## 2016-02-08 MED ORDER — ONDANSETRON HCL 4 MG PO TABS: 4.0000 mg | ORAL_TABLET | Freq: Three times a day (TID) | ORAL | 0 refills | Status: DC | PRN

## 2016-02-08 MED ORDER — SERTRALINE HCL 50 MG PO TABS
150.0000 mg | ORAL_TABLET | Freq: Every day | ORAL | Status: DC
  Administered 2016-02-09 – 2016-02-10 (×2): 150 mg via ORAL
  Filled 2016-02-08 (×2): qty 1

## 2016-02-08 MED ORDER — OXYCODONE HCL 5 MG PO TABS: 5.0000 mg | ORAL_TABLET | Freq: Once | ORAL | Status: DC | PRN

## 2016-02-08 MED ORDER — BUPIVACAINE IN DEXTROSE 0.75-8.25 % IT SOLN
INTRATHECAL | Status: DC | PRN
  Administered 2016-02-08: 1.6 mL via INTRATHECAL

## 2016-02-08 MED ORDER — GABAPENTIN 300 MG PO CAPS: 900.0000 mg | ORAL_CAPSULE | Freq: Three times a day (TID) | ORAL | Status: DC | PRN

## 2016-02-08 MED ORDER — DEXAMETHASONE SODIUM PHOSPHATE 10 MG/ML IJ SOLN
10.0000 mg | Freq: Once | INTRAMUSCULAR | Status: AC
  Administered 2016-02-09: 10 mg via INTRAVENOUS
  Filled 2016-02-08: qty 1

## 2016-02-08 MED ORDER — 0.9 % SODIUM CHLORIDE (POUR BTL) OPTIME
TOPICAL | Status: DC | PRN
  Administered 2016-02-08: 1000 mL

## 2016-02-08 MED ORDER — MAGNESIUM CITRATE PO SOLN: 1.0000 | Freq: Once | ORAL | Status: DC | PRN

## 2016-02-08 MED ORDER — SODIUM CHLORIDE 0.9% FLUSH
INTRAVENOUS | Status: DC | PRN
  Administered 2016-02-08: 40 mL via INTRAVENOUS

## 2016-02-08 MED ORDER — FUROSEMIDE 20 MG PO TABS: 20.0000 mg | ORAL_TABLET | Freq: Every evening | ORAL | Status: DC

## 2016-02-08 MED ORDER — ONDANSETRON HCL 4 MG/2ML IJ SOLN: 4.0000 mg | Freq: Four times a day (QID) | INTRAMUSCULAR | Status: DC | PRN

## 2016-02-08 MED ORDER — HYDROCODONE-ACETAMINOPHEN 5-325 MG PO TABS: 1.0000 | ORAL_TABLET | ORAL | 0 refills | Status: DC | PRN

## 2016-02-08 MED ORDER — LEVOTHYROXINE SODIUM 88 MCG PO TABS
88.0000 ug | ORAL_TABLET | Freq: Every day | ORAL | Status: DC
  Administered 2016-02-09 – 2016-02-10 (×2): 88 ug via ORAL
  Filled 2016-02-08 (×2): qty 1

## 2016-02-08 MED ORDER — ROCURONIUM BROMIDE 50 MG/5ML IV SOSY
PREFILLED_SYRINGE | INTRAVENOUS | Status: AC
  Filled 2016-02-08: qty 5

## 2016-02-08 MED ORDER — CHLORHEXIDINE GLUCONATE 4 % EX LIQD: 60.0000 mL | Freq: Once | CUTANEOUS | Status: DC

## 2016-02-08 MED ORDER — LATANOPROST 0.005 % OP SOLN
1.0000 [drp] | Freq: Every day | OPHTHALMIC | Status: DC
  Administered 2016-02-08 – 2016-02-09 (×2): 1 [drp] via OPHTHALMIC
  Filled 2016-02-08: qty 2.5

## 2016-02-08 MED ORDER — METOPROLOL TARTRATE 50 MG PO TABS
50.0000 mg | ORAL_TABLET | Freq: Two times a day (BID) | ORAL | Status: DC
  Administered 2016-02-08 – 2016-02-10 (×3): 50 mg via ORAL
  Filled 2016-02-08 (×4): qty 1

## 2016-02-08 MED ORDER — FUROSEMIDE 20 MG PO TABS: 20.0000 mg | ORAL_TABLET | ORAL | Status: DC

## 2016-02-08 MED ORDER — DIAZEPAM 2 MG PO TABS
2.0000 mg | ORAL_TABLET | Freq: Three times a day (TID) | ORAL | Status: DC | PRN
  Administered 2016-02-09 – 2016-02-10 (×3): 2 mg via ORAL
  Filled 2016-02-08 (×3): qty 1

## 2016-02-08 MED ORDER — PHENOL 1.4 % MT LIQD: 1.0000 | OROMUCOSAL | Status: DC | PRN

## 2016-02-08 MED ORDER — MENTHOL 3 MG MT LOZG: 1.0000 | LOZENGE | OROMUCOSAL | Status: DC | PRN

## 2016-02-08 MED ORDER — PROPOFOL 10 MG/ML IV BOLUS
INTRAVENOUS | Status: AC
  Filled 2016-02-08: qty 40

## 2016-02-08 MED ORDER — SUGAMMADEX SODIUM 200 MG/2ML IV SOLN
INTRAVENOUS | Status: AC
  Filled 2016-02-08: qty 2

## 2016-02-08 MED ORDER — OXYCODONE HCL 5 MG PO TABS
5.0000 mg | ORAL_TABLET | ORAL | Status: DC | PRN
  Administered 2016-02-08: 10 mg via ORAL
  Administered 2016-02-09 – 2016-02-10 (×6): 5 mg via ORAL
  Filled 2016-02-08 (×6): qty 1

## 2016-02-08 MED ORDER — ONDANSETRON HCL 4 MG PO TABS: 4.0000 mg | ORAL_TABLET | Freq: Four times a day (QID) | ORAL | Status: DC | PRN

## 2016-02-08 SURGICAL SUPPLY — 60 items
BLADE SAW SGTL 18X1.27X75 (BLADE) ×2 IMPLANT
BLADE SURG ROTATE 9660 (MISCELLANEOUS) IMPLANT
CAPT HIP TOTAL 2 ×1 IMPLANT
CELLS DAT CNTRL 66122 CELL SVR (MISCELLANEOUS) ×1 IMPLANT
CLSR STERI-STRIP ANTIMIC 1/2X4 (GAUZE/BANDAGES/DRESSINGS) ×4 IMPLANT
COVER SURGICAL LIGHT HANDLE (MISCELLANEOUS) ×2 IMPLANT
DRAPE C-ARM 42X72 X-RAY (DRAPES) ×2 IMPLANT
DRAPE IMP U-DRAPE 54X76 (DRAPES) ×6 IMPLANT
DRAPE INCISE IOBAN 66X45 STRL (DRAPES) ×2 IMPLANT
DRAPE ORTHO SPLIT 77X108 STRL (DRAPES) ×4
DRAPE PROXIMA HALF (DRAPES) ×2 IMPLANT
DRAPE STERI IOBAN 125X83 (DRAPES) ×2 IMPLANT
DRAPE SURG 17X23 STRL (DRAPES) ×2 IMPLANT
DRAPE SURG ORHT 6 SPLT 77X108 (DRAPES) ×2 IMPLANT
DRAPE U-SHAPE 47X51 STRL (DRAPES) ×2 IMPLANT
DRSG AQUACEL AG ADV 3.5X10 (GAUZE/BANDAGES/DRESSINGS) ×2 IMPLANT
DURAPREP 26ML APPLICATOR (WOUND CARE) ×2 IMPLANT
ELECT BLADE 4.0 EZ CLEAN MEGAD (MISCELLANEOUS)
ELECT CAUTERY BLADE 6.4 (BLADE) ×2 IMPLANT
ELECT REM PT RETURN 9FT ADLT (ELECTROSURGICAL) ×2
ELECTRODE BLDE 4.0 EZ CLN MEGD (MISCELLANEOUS) IMPLANT
ELECTRODE REM PT RTRN 9FT ADLT (ELECTROSURGICAL) ×1 IMPLANT
FACESHIELD WRAPAROUND (MASK) ×4 IMPLANT
FACESHIELD WRAPAROUND OR TEAM (MASK) ×2 IMPLANT
GLOVE BIOGEL PI IND STRL 7.0 (GLOVE) IMPLANT
GLOVE BIOGEL PI INDICATOR 7.0 (GLOVE)
GLOVE ECLIPSE 7.0 STRL STRAW (GLOVE) IMPLANT
GLOVE ORTHO TXT STRL SZ7.5 (GLOVE) ×4 IMPLANT
GOWN STRL REUS W/ TWL LRG LVL3 (GOWN DISPOSABLE) ×3 IMPLANT
GOWN STRL REUS W/ TWL XL LVL3 (GOWN DISPOSABLE) ×1 IMPLANT
GOWN STRL REUS W/TWL LRG LVL3 (GOWN DISPOSABLE) ×6
GOWN STRL REUS W/TWL XL LVL3 (GOWN DISPOSABLE) ×2
KIT BASIN OR (CUSTOM PROCEDURE TRAY) ×2 IMPLANT
KIT ROOM TURNOVER OR (KITS) ×2 IMPLANT
MANIFOLD NEPTUNE II (INSTRUMENTS) ×2 IMPLANT
NDL HYPO 25GX1X1/2 BEV (NEEDLE) ×1 IMPLANT
NDL SAFETY ECLIPSE 18X1.5 (NEEDLE) ×1 IMPLANT
NEEDLE HYPO 18GX1.5 SHARP (NEEDLE) ×2
NEEDLE HYPO 25GX1X1/2 BEV (NEEDLE) ×2 IMPLANT
NS IRRIG 1000ML POUR BTL (IV SOLUTION) ×2 IMPLANT
PACK TOTAL JOINT (CUSTOM PROCEDURE TRAY) ×2 IMPLANT
PAD ARMBOARD 7.5X6 YLW CONV (MISCELLANEOUS) ×4 IMPLANT
RETRACTOR WND ALEXIS 18 MED (MISCELLANEOUS) ×1 IMPLANT
RTRCTR WOUND ALEXIS 18CM MED (MISCELLANEOUS) ×2
STRIP CLOSURE SKIN 1/2X4 (GAUZE/BANDAGES/DRESSINGS) ×1 IMPLANT
SUCTION FRAZIER HANDLE 10FR (MISCELLANEOUS) ×1
SUCTION TUBE FRAZIER 10FR DISP (MISCELLANEOUS) ×1 IMPLANT
SUT ETHIBOND NAB CT1 #1 30IN (SUTURE) IMPLANT
SUT MNCRL AB 4-0 PS2 18 (SUTURE) ×2 IMPLANT
SUT VIC AB 0 CT1 27 (SUTURE) ×2
SUT VIC AB 0 CT1 27XBRD ANBCTR (SUTURE) ×1 IMPLANT
SUT VIC AB 1 CT1 27 (SUTURE)
SUT VIC AB 1 CT1 27XBRD ANBCTR (SUTURE) IMPLANT
SUT VIC AB 2-0 CT1 27 (SUTURE) ×4
SUT VIC AB 2-0 CT1 TAPERPNT 27 (SUTURE) ×2 IMPLANT
SYR 50ML LL SCALE MARK (SYRINGE) ×2 IMPLANT
SYR CONTROL 10ML LL (SYRINGE) ×2 IMPLANT
TOWEL OR 17X24 6PK STRL BLUE (TOWEL DISPOSABLE) ×1 IMPLANT
TOWEL OR 17X26 10 PK STRL BLUE (TOWEL DISPOSABLE) ×1 IMPLANT
WATER STERILE IRR 1000ML POUR (IV SOLUTION) ×1 IMPLANT

## 2016-02-08 NOTE — Anesthesia Postprocedure Evaluation (Signed)
Anesthesia Post Note  Patient: [name]  Procedure(s) Performed: Procedure(s) (LRB): TOTAL HIP ARTHROPLASTY ANTERIOR APPROACH (Left)  Patient location during evaluation: PACU Anesthesia Type: Spinal Level of consciousness: oriented and awake and alert Pain management: pain level controlled Vital Signs Assessment: post-procedure vital signs reviewed and stable Respiratory status: spontaneous breathing, respiratory function stable and patient connected to nasal cannula oxygen Cardiovascular status: blood pressure returned to baseline and stable Postop Assessment: no headache and no backache Anesthetic complications: no       Last Vitals:  Vitals:   02/08/16 1210 02/08/16 1230  BP: (!) 109/46 (!) 119/47  Pulse: (!) 46 (!) 44  Resp: 13 10  Temp: 36.3 C     Last Pain:  Vitals:   02/08/16 1210  TempSrc:   PainSc: 0-No pain                 Denise Washburn S

## 2016-02-08 NOTE — Discharge Instructions (Signed)
INSTRUCTIONS AFTER JOINT REPLACEMENT   o Remove items at home which could result in a fall. This includes throw rugs or furniture in walking pathways o ICE to the affected joint every three hours while awake for 30 minutes at a time, for at least the first 3-5 days, and then as needed for pain and swelling.  Continue to use ice for pain and swelling. You may notice swelling that will progress down to the foot and ankle.  This is normal after surgery.  Elevate your leg when you are not up walking on it.   o Continue to use the breathing machine you got in the hospital (incentive spirometer) which will help keep your temperature down.  It is common for your temperature to cycle up and down following surgery, especially at night when you are not up moving around and exerting yourself.  The breathing machine keeps your lungs expanded and your temperature down.   DIET:  As you were doing prior to hospitalization, we recommend a well-balanced diet.  DRESSING / WOUND CARE / SHOWERING  Keep the surgical dressing until follow up.  The dressing is water proof, so you can shower without any extra covering.  IF THE DRESSING FALLS OFF or the wound gets wet inside, change the dressing with sterile gauze.  Please use good hand washing techniques before changing the dressing.  Do not use any lotions or creams on the incision until instructed by your surgeon.    ACTIVITY  o Increase activity slowly as tolerated, but follow the weight bearing instructions below.   o No driving for 6 weeks or until further direction given by your physician.  You cannot drive while taking narcotics.  o No lifting or carrying greater than 10 lbs. until further directed by your surgeon. o Avoid periods of inactivity such as sitting longer than an hour when not asleep. This helps prevent blood clots.  o You may return to work once you are authorized by your doctor.     WEIGHT BEARING   Weight bearing as tolerated with assist  device (walker, cane, etc) as directed, use it as long as suggested by your surgeon or therapist, typically at least 4-6 weeks.   CONSTIPATION  Constipation is defined medically as fewer than three stools per week and severe constipation as less than one stool per week.  Even if you have a regular bowel pattern at home, your normal regimen is likely to be disrupted due to multiple reasons following surgery.  Combination of anesthesia, postoperative narcotics, change in appetite and fluid intake all can affect your bowels.   YOU MUST use at least one of the following options; they are listed in order of increasing strength to get the job done.  They are all available over the counter, and you may need to use some, POSSIBLY even all of these options:    Drink plenty of fluids (prune juice may be helpful) and high fiber foods Colace 100 mg by mouth twice a day  Senokot for constipation as directed and as needed Dulcolax (bisacodyl), take with full glass of water  Miralax (polyethylene glycol) once or twice a day as needed.  If you have tried all these things and are unable to have a bowel movement in the first 3-4 days after surgery call either your surgeon or your primary doctor.    If you experience loose stools or diarrhea, hold the medications until you stool forms back up.  If your symptoms do not get   better within 1 week or if they get worse, check with your doctor.  If you experience "the worst abdominal pain ever" or develop nausea or vomiting, please contact the office immediately for further recommendations for treatment.   ITCHING:  If you experience itching with your medications, try taking only a single pain pill, or even half a pain pill at a time.  You can also use Benadryl over the counter for itching or also to help with sleep.   TED HOSE STOCKINGS:  Use stockings on both legs until for at least 2 weeks or as directed by physician office. They may be removed at night for  sleeping.  MEDICATIONS:  See your medication summary on the "After Visit Summary" that nursing will review with you.  You may have some home medications which will be placed on hold until you complete the course of blood thinner medication.  It is important for you to complete the blood thinner medication as prescribed.  PRECAUTIONS:  If you experience chest pain or shortness of breath - call 911 immediately for transfer to the hospital emergency department.   If you develop a fever greater that 101 F, purulent drainage from wound, increased redness or drainage from wound, foul odor from the wound/dressing, or calf pain - CONTACT YOUR SURGEON.                                                   FOLLOW-UP APPOINTMENTS:  If you do not already have a post-op appointment, please call the office for an appointment to be seen by your surgeon.  Guidelines for how soon to be seen are listed in your "After Visit Summary", but are typically between 1-4 weeks after surgery.  OTHER INSTRUCTIONS:   Knee Replacement:  Do not place pillow under knee, focus on keeping the knee straight while resting. CPM instructions: 0-90 degrees, 2 hours in the morning, 2 hours in the afternoon, and 2 hours in the evening. Place foam block, curve side up under heel at all times except when in CPM or when walking.  DO NOT modify, tear, cut, or change the foam block in any way.  MAKE SURE YOU:  . Understand these instructions.  . Get help right away if you are not doing well or get worse.    Thank you for letting us be a part of your medical care team.  It is a privilege we respect greatly.  We hope these instructions will help you stay on track for a fast and full recovery!    

## 2016-02-08 NOTE — Anesthesia Procedure Notes (Signed)
Spinal  Patient location during procedure: OR Start time: 02/08/2016 9:53 AM End time: 02/08/2016 9:59 AM Staffing Anesthesiologist: Chaney MallingHODIERNE, ADAM Performed: anesthesiologist  Preanesthetic Checklist Completed: patient identified, site marked, surgical consent, pre-op evaluation, timeout performed, IV checked, risks and benefits discussed and monitors and equipment checked Spinal Block Patient position: sitting Prep: DuraPrep Patient monitoring: heart rate, cardiac monitor, continuous pulse ox and blood pressure Approach: midline Location: L3-4 Injection technique: single-shot Needle Needle type: Pencan  Needle gauge: 24 G Needle length: 9 cm Assessment Sensory level: T8 Additional Notes Pt tolerated the procedure well.

## 2016-02-08 NOTE — Transfer of Care (Signed)
Immediate Anesthesia Transfer of Care Note  Patient: Connie HaringNancy L Crossen  Procedure(s) Performed: Procedure(s): TOTAL HIP ARTHROPLASTY ANTERIOR APPROACH (Left)  Patient Location: PACU  Anesthesia Type:Spinal  Level of Consciousness: awake, alert  and oriented  Airway & Oxygen Therapy: Patient Spontanous Breathing and Patient connected to nasal cannula oxygen  Post-op Assessment: Report given to RN and Post -op Vital signs reviewed and stable  Post vital signs: Reviewed and stable  Last Vitals:  Vitals:   02/08/16 0804  BP: (!) 156/53  Pulse: 61  Resp: 18  Temp: 36.4 C    Last Pain:  Vitals:   02/08/16 0804  TempSrc: Oral         Complications: No apparent anesthesia complications

## 2016-02-08 NOTE — Discharge Summary (Addendum)
Patient ID: [name] MRN: [id number] DOB/AGE: [id number] 81 y.o.  Admit date: 02/08/2016 Discharge date: 02/10/2016  Admission Diagnoses:  Active Problems:   Primary localized osteoarthritis of left hip   Discharge Diagnoses:  Same  Past Medical History:  Diagnosis Date  . Aneurysm (HCC)    brain  rt side  . Arthritis   . Chronic kidney disease    stage 3  . GERD (gastroesophageal reflux disease)   . Heart murmur    years ago asymp  . History of hiatal hernia   . Hypertension   . Hypothyroidism   . Neuropathy (HCC)     Surgeries: Procedure(s): TOTAL HIP ARTHROPLASTY ANTERIOR APPROACH on 02/08/2016   Consultants:   Discharged Condition: Improved  Hospital Course: Connie Haringancy L Becvar is an 83 y.o. female who was admitted 02/08/2016 for operative treatment of primary localized osteoarthritis left hip. Patient has severe unremitting pain that affects sleep, daily activities, and work/hobbies. After pre-op clearance the patient was taken to the operating room on 02/08/2016 and underwent  Procedure(s): TOTAL HIP ARTHROPLASTY ANTERIOR APPROACH.    Patient was given perioperative antibiotics:  Anti-infectives    Start     Dose/Rate Route Frequency Ordered Stop   02/08/16 1600  ceFAZolin (ANCEF) IVPB 2g/100 mL premix     2 g 200 mL/hr over 30 Minutes Intravenous Every 6 hours 02/08/16 1305 02/08/16 2158   02/08/16 0930  ceFAZolin (ANCEF) IVPB 2g/100 mL premix     2 g 200 mL/hr over 30 Minutes Intravenous To ShortStay Surgical 02/07/16 1121 02/08/16 1000       Patient was given sequential compression devices, early ambulation, and chemoprophylaxis to prevent DVT.  Patient benefited maximally from hospital stay and there were no complications.    Recent vital signs:  Patient Vitals for the past 24 hrs:  BP Temp Temp src Pulse Resp SpO2  02/10/16 0624 (!) 121/36 97.6 F (36.4 C) Oral 61 15 97 %  02/09/16 2239 (!) 110/45 98.4 F (36.9 C) Oral 70 15 98 %  02/09/16 2126 (!)  115/41 97.9 F (36.6 C) Oral 61 15 99 %  02/09/16 1456 (!) 97/37 97.8 F (36.6 C) Oral (!) 55 16 97 %  02/09/16 1258 (!) 113/32 - - 62 - 95 %     Recent laboratory studies:   Recent Labs  02/08/16 1547 02/09/16 0412  WBC 13.1* 5.8  HGB 11.4* 10.4*  HCT 35.1* 31.7*  PLT 214 188  NA  --  140  K  --  4.0  CL  --  106  CO2  --  27  BUN  --  12  CREATININE 0.90 0.85  GLUCOSE  --  113*  CALCIUM  --  8.7*     Discharge Medications:   lovenox 40mg , one inj sub q daily x 21 days following surgery.  zofran 4mg  take 1 tab po q8 hours prn nausea norco 5-325 take 1-2 tabs po q 4-6 hours prn pain   Diagnostic Studies: Dg C-arm 61-120 Min  Result Date: 02/08/2016 CLINICAL DATA:  LEFT hip surgery EXAM: OPERATIVE LEFT HIP (WITH PELVIS IF PERFORMED) 3 VIEWS TECHNIQUE: Fluoroscopic spot image(s) were submitted for interpretation post-operatively. COMPARISON:  CT LEFT hip 11/29/2015 FLUOROSCOPY TIME:  0 minutes 14 seconds Images obtained: 3 FINDINGS: Osseous demineralization. LEFT hip prosthesis identified without fracture or dislocation. Margin of an old RIGHT hip prosthesis incidentally noted. IMPRESSION: LEFT hip prosthesis without acute abnormalities. Electronically Signed   By: Angelyn PuntMark  Boles M.D.  On: 02/08/2016 11:48   Dg Hip Operative Unilat W Or W/o Pelvis Left  Result Date: 02/08/2016 CLINICAL DATA:  LEFT hip surgery EXAM: OPERATIVE LEFT HIP (WITH PELVIS IF PERFORMED) 3 VIEWS TECHNIQUE: Fluoroscopic spot image(s) were submitted for interpretation post-operatively. COMPARISON:  CT LEFT hip 11/29/2015 FLUOROSCOPY TIME:  0 minutes 14 seconds Images obtained: 3 FINDINGS: Osseous demineralization. LEFT hip prosthesis identified without fracture or dislocation. Margin of an old RIGHT hip prosthesis incidentally noted. IMPRESSION: LEFT hip prosthesis without acute abnormalities. Electronically Signed   By: Ulyses Southward M.D.   On: 02/08/2016 11:48    Disposition: 01-Home or Self Care      Contact information for follow-up providers    Loreta Ave, MD. Schedule an appointment as soon as possible for a visit in 2 week(s).   Specialty:  Orthopedic Surgery Contact information: [address] [place] [address] [phone]            Contact information for after-discharge care    Destination    [facility] .   Specialty:  Skilled Nursing Facility Contact information: [address] [place] [address] [phone]                   Signed: Otilio Saber 02/10/2016, 8:28 AM

## 2016-02-08 NOTE — Evaluation (Signed)
Physical Therapy Evaluation Patient Details Name: [name] MRN: [id number] DOB: 04/30/1927 Today's Date: 02/08/2016   History of Present Illness  83 y.o. female admitted to [facility] on 02/08/16 for elective L direct anterior THA.  Pt with significant PMhx of neuropathy, HTN, CKD, aneruysm, L shoulder surgery, R THA (1993), cerebral aneurysm repair, and L ankle fx surgery (2009).  Clinical Impression  Pt is POD #0 and was able to transfer min-mod assist out of elevated bed to chair.  She would benefit from post acute rehab at discharge and is requesting [facility] [place] [place].   PT to follow acutely for deficits listed below.       Follow Up Recommendations SNF;Supervision/Assistance - 24 hour (requesting [facility])    Equipment Recommendations  None recommended by PT    Recommendations for Other Services   NA    Precautions / Restrictions Precautions Precautions: Fall Restrictions Weight Bearing Restrictions: Yes LLE Weight Bearing: Weight bearing as tolerated      Mobility  Bed Mobility Overal bed mobility: Needs Assistance Bed Mobility: Supine to Sit     Supine to sit: Min assist;HOB elevated     General bed mobility comments: Min assist to help progress both legs over EOB together (pt reports this is less painful this way.  Pt needed support separately at trunk to come to sitting from elevated HOB.  Verbal cues for safe hand placement and pt was heavily reliant on the bed rail.   Transfers Overall transfer level: Needs assistance Equipment used: Rolling walker (2 wheeled) Transfers: Sit to/from [employer] Sit to Stand: Mod assist;From elevated surface Stand pivot transfers: Min assist       General transfer comment: Mod assist to stand from elevated bed to support trunk and stabilize RW (as pt's preference was to pull on it).  Bed elevated to assist in inital stand.  Verbal cues for safe foot and hand placement.  Min assist to  pivot to the recliner chair with RW.   Ambulation/Gait             General Gait Details: deferred due to nausea         Balance Overall balance assessment: Needs assistance Sitting-balance support: Feet supported;Bilateral upper extremity supported Sitting balance-Leahy Scale: Fair Sitting balance - Comments: supervision EOB and posterior preference. Postural control: Posterior lean Standing balance support: Bilateral upper extremity supported Standing balance-Leahy Scale: Poor Standing balance comment: min to mod assist and need for RW for support in standing.                              Pertinent Vitals/Pain Pain Assessment: 0-10 Pain Score: 8  Pain Location: left hip Pain Descriptors / Indicators: Aching Pain Intervention(s): Limited activity within patient's tolerance;Monitored during session;Repositioned;Premedicated before session;Other (comment) (was just given two tylenol)    Home Living Family/patient expects to be discharged to:: Skilled nursing facility ([facility]) Living Arrangements: Alone Available Help at Discharge: Family;Available PRN/intermittently Type of Home: House Home Access: Ramped entrance     Home Layout: One level Home Equipment: Walker - 4 wheels;Shower seat - built in;Bedside commode      Prior Function Level of Independence: Independent with assistive device(s)         Comments: rolling walker used for gait at baseline, she still drives        Extremity/Trunk Assessment   Upper Extremity Assessment Upper Extremity Assessment: Defer  to OT evaluation    Lower Extremity Assessment Lower Extremity Assessment: LLE deficits/detail;RLE deficits/detail RLE Deficits / Details: right leg with h/o THA in 1993 LLE Deficits / Details: Left leg with normal post op pain and weakness.  Ankle at least 3/5, knee 3-/5, hip 2/5 per gross functional assessment.     Cervical / Trunk Assessment Cervical / Trunk  Assessment: Normal  Communication   Communication: No difficulties  Cognition Arousal/Alertness: Awake/alert Behavior During Therapy: WFL for tasks assessed/performed Overall Cognitive Status: Within Functional Limits for tasks assessed                 General Comments: At times she has some slow processing and difficulty getting out what she wants to say.  Not sure if this is baseline or post anesthesia.     General Comments General comments (skin integrity, edema, etc.): Despite nausea VSS after transfer to chair BP 126/40, HR 56, O2 sats 100% on 3 L O2 [place].      Exercises Total Joint Exercises Ankle Circles/Pumps: AROM;Both;20 reps   Assessment/Plan    PT Assessment Patient needs continued PT services  PT Problem List Decreased strength;Decreased range of motion;Decreased activity tolerance;Decreased balance;Decreased mobility;Decreased knowledge of use of DME;Pain;Obesity          PT Treatment Interventions DME instruction;Gait training;Functional mobility training;Therapeutic activities;Therapeutic exercise;Balance training;Patient/family education;Manual techniques;Modalities    PT Goals (Current goals can be found in the Care Plan section)  Acute Rehab PT Goals Patient Stated Goal: to be able to keep up with her grandkids and great grandkids PT Goal Formulation: With patient Time For Goal Achievement: 02/15/16 Potential to Achieve Goals: Good    Frequency 7X/week   Barriers to discharge Decreased caregiver support         End of Session Equipment Utilized During Treatment: Gait belt Activity Tolerance: Patient limited by pain;Other (comment) (and by nausea) Patient left: in chair;with call bell/phone within reach Nurse Communication: Mobility status         Time: [id number] PT Time Calculation (min) (ACUTE ONLY): 52 min   Charges:   PT Evaluation $PT Eval Moderate Complexity: 1 Procedure PT Treatments $Therapeutic Activity: 23-37 mins         Hildy Nicholl B. Brynlie Daza, PT, DPT [phone]   02/08/2016, 6:20 PM

## 2016-02-08 NOTE — Anesthesia Procedure Notes (Signed)
Procedure Name: MAC Date/Time: 02/08/2016 9:50 AM Performed by: Eligha Bridegroom Pre-anesthesia Checklist: Emergency Drugs available, Suction available, Patient being monitored and Timeout performed Patient Re-evaluated:Patient Re-evaluated prior to inductionOxygen Delivery Method: Nasal cannula Preoxygenation: Pre-oxygenation with 100% oxygen Intubation Type: IV induction

## 2016-02-08 NOTE — Anesthesia Preprocedure Evaluation (Addendum)
Anesthesia Evaluation  Patient identified by MRN, date of birth, ID band Patient awake    Reviewed: Allergy & Precautions, H&P , NPO status , Patient's Chart, lab work & pertinent test results  Airway Mallampati: II   Neck ROM: full    Dental   Pulmonary neg pulmonary ROS,    breath sounds clear to auscultation       Cardiovascular hypertension,  Rhythm:regular Rate:Normal     Neuro/Psych Cerebral aneurysm    GI/Hepatic hiatal hernia, GERD  ,  Endo/Other  Hypothyroidism   Renal/GU Renal InsufficiencyRenal disease     Musculoskeletal  (+) Arthritis ,   Abdominal   Peds  Hematology   Anesthesia Other Findings   Reproductive/Obstetrics                            Anesthesia Physical Anesthesia Plan  ASA: III  Anesthesia Plan: MAC and Spinal   Post-op Pain Management:    Induction: Intravenous  Airway Management Planned: Simple Face Mask  Additional Equipment:   Intra-op Plan:   Post-operative Plan:   Informed Consent: I have reviewed the patients History and Physical, chart, labs and discussed the procedure including the risks, benefits and alternatives for the proposed anesthesia with the patient or authorized representative who has indicated his/her understanding and acceptance.     Plan Discussed with: CRNA, Anesthesiologist and Surgeon  Anesthesia Plan Comments:        Anesthesia Quick Evaluation

## 2016-02-09 ENCOUNTER — Encounter (HOSPITAL_COMMUNITY): Payer: Self-pay | Admitting: Orthopedic Surgery

## 2016-02-09 LAB — CBC
HEMATOCRIT: 31.7 % — AB (ref 36.0–46.0)
HEMOGLOBIN: 10.4 g/dL — AB (ref 12.0–15.0)
MCH: 30.5 pg (ref 26.0–34.0)
MCHC: 32.8 g/dL (ref 30.0–36.0)
MCV: 93 fL (ref 78.0–100.0)
Platelets: 188 10*3/uL (ref 150–400)
RBC: 3.41 MIL/uL — ABNORMAL LOW (ref 3.87–5.11)
RDW: 14.7 % (ref 11.5–15.5)
WBC: 5.8 10*3/uL (ref 4.0–10.5)

## 2016-02-09 LAB — BASIC METABOLIC PANEL
ANION GAP: 7 (ref 5–15)
BUN: 12 mg/dL (ref 6–20)
CHLORIDE: 106 mmol/L (ref 101–111)
CO2: 27 mmol/L (ref 22–32)
Calcium: 8.7 mg/dL — ABNORMAL LOW (ref 8.9–10.3)
Creatinine, Ser: 0.85 mg/dL (ref 0.44–1.00)
GFR calc non Af Amer: >60 mL/min (ref >60)
Glucose, Bld: 113 mg/dL — ABNORMAL HIGH (ref 65–99)
POTASSIUM: 4 mmol/L (ref 3.5–5.1)
Sodium: 140 mmol/L (ref 135–145)

## 2016-02-09 NOTE — Progress Notes (Signed)
Physical Therapy Treatment Patient Details Name: [name] MRN: [id number] DOB: 10-04-1927 Today's Date: 02/09/2016    History of Present Illness 83 y.o. female admitted to [facility] on 02/08/16 for elective L direct anterior THA.  Pt with significant PMhx of neuropathy, HTN, CKD, aneruysm, L shoulder surgery, R THA (1993), cerebral aneurysm repair, and L ankle fx surgery (2009).    PT Comments    Pt is POD #1 and is moving better.  It is still a significant effort to get to her feet and she has to make a conscious effort to walk with a more normal gait pattern (re-training bad habits that she learned as her hip was getting worse PTA).  She continues to benefit from post acute rehab at discharge.  PT will continue to follow acutely.    Follow Up Recommendations  SNF;Supervision/Assistance - 24 hour (requesting [facility])     Equipment Recommendations  None recommended by PT    Recommendations for Other Services   NA     Precautions / Restrictions Precautions Precautions: Fall Restrictions LLE Weight Bearing: Weight bearing as tolerated    Mobility  Bed Mobility               General bed mobility comments: Pt OOB in chair and wanted to stay in chair after session.   Transfers Overall transfer level: Needs assistance Equipment used: Rolling walker (2 wheeled) Transfers: Sit to/from Stand Sit to Stand: Mod assist         General transfer comment: Mod assist to support trunk to power up and to help anteriorly weight shift trunk over feet as well as stabilize RW to prevent posterior LOB. Verbal cues for safe hand placement.   Ambulation/Gait Ambulation/Gait assistance: Min assist Ambulation Distance (Feet): 85 Feet Assistive device: Rolling walker (2 wheeled) Gait Pattern/deviations: Step-to pattern;Antalgic;Trunk flexed;Narrow base of support Gait velocity: decreased Gait velocity interpretation: Below normal speed for age/gender General Gait Details:  Verbal cues for upright posture, left heel down and left toe forward during gait.  Her pre-surgery gait pattern has thrown her whole pelvis and back out of alighnment making her left leg appear significantly shorter.  Even sitting in the chair her left hip/pelvis is elevated.  Verbal cues to try to sit with better alignment. PT pulled chair behind during gait so that pt could walk as far as she could without worrying about how she would get back.                       Balance Overall balance assessment: Needs assistance Sitting-balance support: Feet supported;Bilateral upper extremity supported Sitting balance-Leahy Scale: Fair     Standing balance support: Bilateral upper extremity supported Standing balance-Leahy Scale: Poor                      Cognition Arousal/Alertness: Awake/alert Behavior During Therapy: WFL for tasks assessed/performed Overall Cognitive Status: Within Functional Limits for tasks assessed                      Exercises Total Joint Exercises Short Arc Quad: AROM;Left;10 reps Hip ABduction/ADduction: AAROM;Left;10 reps Long Arc Quad: AROM;Left;10 reps        Pertinent Vitals/Pain Pain Assessment: 0-10 Pain Score: 8  Pain Location: left hip and thigh Pain Descriptors / Indicators: Aching;Sore;Grimacing;Guarding Pain Intervention(s): Limited activity within patient's tolerance;Monitored during session;Repositioned           PT Goals (current goals can  now be found in the care plan section) Acute Rehab PT Goals Patient Stated Goal: to be able to keep up with her grandkids and great grandkids Progress towards PT goals: Progressing toward goals    Frequency    7X/week      PT Plan Current plan remains appropriate       End of Session Equipment Utilized During Treatment: Gait belt Activity Tolerance: Patient limited by pain;Patient limited by fatigue Patient left: in chair;with call bell/phone within reach      Time: [id number] PT Time Calculation (min) (ACUTE ONLY): 57 min  Charges:  $Gait Training: 8-22 mins $Therapeutic Exercise: 8-22 mins                      Kohana Amble B. Alilah Mcmeans, PT, DPT [phone]   02/09/2016, 11:23 PM

## 2016-02-09 NOTE — Progress Notes (Signed)
Subjective: 1 Day Post-Op Procedure(s) (LRB): TOTAL HIP ARTHROPLASTY ANTERIOR APPROACH (Left) Patient reports pain as severe.  Patient is a little nauseous and without much appetite.  Thinks this is pain mediated.  Otherwise, no nausea/vomiting, lightheadedness/dizziness, chest pain/sob.    Objective: Vital signs in last 24 hours: Temp:  [97.3 F (36.3 C)-98.5 F (36.9 C)] 98.2 F (36.8 C) (01/04 0630) Pulse Rate:  [40-69] 69 (01/04 0630) Resp:  [10-16] 16 (01/04 0630) BP: (100-126)/(38-86) 115/47 (01/04 0630) SpO2:  [93 %-100 %] 99 % (01/04 0630)  Intake/Output from previous day: 01/03 0701 - 01/04 0700 In: 2185 [P.O.:240; I.V.:1945] Out: 200 [Blood:200] Intake/Output this shift: No intake/output data recorded.   Recent Labs  02/08/16 1547 02/09/16 0412  HGB 11.4* 10.4*    Recent Labs  02/08/16 1547 02/09/16 0412  WBC 13.1* 5.8  RBC 3.79* 3.41*  HCT 35.1* 31.7*  PLT 214 188    Recent Labs  02/08/16 1547 02/09/16 0412  NA  --  140  K  --  4.0  CL  --  106  CO2  --  27  BUN  --  12  CREATININE 0.90 0.85  GLUCOSE  --  113*  CALCIUM  --  8.7*   No results for input(s): LABPT, INR in the last 72 hours.  Neurologically intact Neurovascular intact Sensation intact distally Intact pulses distally Dorsiflexion/Plantar flexion intact Incision: dressing C/D/I No cellulitis present Compartment soft  Assessment/Plan: 1 Day Post-Op Procedure(s) (LRB): TOTAL HIP ARTHROPLASTY ANTERIOR APPROACH (Left) Advance diet Up with therapy Plan for discharge tomorrow with hhpt.  PT does NOT need SNF WBAT LLE ABLA-mild and stable Talked to patient about importance of staying ahead of pain in order to progress  Dry dressing change prn  Otilio SaberM Lindsey Kimani Bedoya 02/09/2016, 8:11 AM

## 2016-02-09 NOTE — Clinical Social Work Note (Signed)
Clinical Social Work Assessment  Patient Details  Name: [name] MRN: [id number] Date of Birth: March 09, 1927  Date of referral:  02/09/16               Reason for consult:  Facility Placement                Permission sought to share information with:  Family Supports Permission granted to share information::  Yes, Verbal Permission Granted  Name::     [name]  Agency::     Relationship::  daughter  Contact Information:  [phone]  Housing/Transportation Living arrangements for the past 2 months:  Single Family Home Source of Information:  Patient Patient Interpreter Needed:  None Criminal Activity/Legal Involvement Pertinent to Current Situation/Hospitalization:  No - Comment as needed Significant Relationships:  Adult Children Lives with:  Self, Friends Do you feel safe going back to the place where you live?  Yes Need for family participation in patient care:  Yes (Comment)  Care giving concerns:  Pt's daughter [name] was present at bedside with pt during initial assessment. Pt has a lady who comes in to stay with pt at nights.  Social Worker assessment / plan:  CSW spoke with pt at bedside to complete initial assessment. Pt lives home alone, however has a lady stay with her at night and has three daughters who are in and out. Pt is agreeable to SNF placement at this time. Pt preferred choice is [facility]. Per pt's daughter she has already spoken with [facility] and they state it should not be a problem. CSW will reach out to facility to determine bed availability. Pt's daughter was inquiring about when pt would d/c. CSW explained it is typically two days post op sometimes longer if MD determines alternative health concerns. CSW will continue to follow and set up PTAR once pt is medically cleared, possibly tomorrow 02/10/16.   Employment status:  Retired Database administratornsurance information:  Managed Medicare PT Recommendations:  Skilled Nursing Facility Information / Referral to community  resources:  Skilled Nursing Facility  Patient/Family's Response to care:  Pt verbalized understanding of CSW role and expressed gratitude for support. Pt denies any concern regarding pt care at this time. Pt's daughter denies any concern regarding pt care at this time.   Patient/Family's Understanding of and Emotional Response to Diagnosis, Current Treatment, and Prognosis:  Pt and pt's daughter verbalized understanding and deemed realistic regarding pt's physical limitations at this time. Pt and pt's daughter is familiar with outpatient PT and verbalized understanding of SNF placement process. Pt is agreeable to SNF placement at this time, when pt is medically cleared for d/c. Pt denies any concern for treatment plan at this time.   Emotional Assessment Appearance:  Appears stated age Attitude/Demeanor/Rapport:   (Patient was appropriate.) Affect (typically observed):  Accepting, Appropriate, Calm, Pleasant Orientation:  Oriented to Self, Oriented to Place, Oriented to  Time, Oriented to Situation Alcohol / Substance use:  Not Applicable Psych involvement (Current and /or in the community):  No (Comment)  Discharge Needs  Concerns to be addressed:  No discharge needs identified Readmission within the last 30 days:  No Current discharge risk:  Dependent with Mobility Barriers to Discharge:  Continued Medical Work up   Safeway IncBridget A Mayton, LCSW 02/09/2016, 11:29 AM

## 2016-02-09 NOTE — Clinical Social Work Placement (Signed)
   CLINICAL SOCIAL WORK PLACEMENT  NOTE  Date:  02/09/2016  Patient Details  Name: [name] MRN: [id number] Date of Birth: 1927-11-29  Clinical Social Work is seeking post-discharge placement for this patient at the Skilled  Nursing Facility level of care (*CSW will initial, date and re-position this form in  chart as items are completed):      Patient/family provided with [facility] Clinical Social Work Department's list of facilities offering this level of care within the geographic area requested by the patient (or if unable, by the patient's family).  Yes   Patient/family informed of their freedom to choose among providers that offer the needed level of care, that participate in Medicare, Medicaid or managed care program needed by the patient, have an available bed and are willing to accept the patient.      Patient/family informed of [facility]'s ownership interest in [facility] and [facility], as well as of the fact that they are under no obligation to receive care at these facilities.  PASRR submitted to EDS on       PASRR number received on 02/09/16     Existing PASRR number confirmed on       FL2 transmitted to all facilities in geographic area requested by pt/family on 02/09/16     FL2 transmitted to all facilities within larger geographic area on       Patient informed that his/her managed care company has contracts with or will negotiate with certain facilities, including the following:        Yes   Patient/family informed of bed offers received.  Patient chooses bed at [facility]     Physician recommends and patient chooses bed at      Patient to be transferred to [facility] on 02/10/16.  Patient to be transferred to facility by [facility]     Patient family notified on   of transfer.  Name of family member notified:        PHYSICIAN Please prepare priority discharge summary, including medications, Please prepare  prescriptions, Please sign FL2     Additional Comment:    _______________________________________________ Volney AmericanBridget A Mayton, LCSW 02/09/2016, 11:36 AM

## 2016-02-09 NOTE — Op Note (Signed)
[name]:  [name], [name]                ACCOUNT NO.:  [id number]  MEDICAL RECORD NO.:  [id number]  LOCATION:                                 FACILITY:  PHYSICIAN:  Loreta Aveaniel F. Abhijay Morriss, M.D. DATE OF BIRTH:  [birth year]  DATE OF PROCEDURE:  02/08/2016 DATE OF DISCHARGE:                              OPERATIVE REPORT   PREOPERATIVE DIAGNOSIS:  End-stage arthritis of the left hip, primary generalized.  POSTOPERATIVE DIAGNOSIS:  End-stage arthritis of the left hip, primary generalized.  Significant bone loss femoral head with marked shortening.  PROCEDURE:  Direct anterior left total hip replacement utilizing Stryker prosthesis.  A press-fit 50-mm acetabular component.  Screw fixation x2. A 36-mm internal diameter liner.  Press-fit #3 Accolade stem, 36- 5 Biolox head.  SURGEON:  Loreta Aveaniel F. Queen Abbett, M.D.  ASSISTANT:  Mikey KirschnerLindsey Stanberry, PA present throughout the entire case and necessary for timely completion of procedure.  ANESTHESIA:  Spinal.  BLOOD LOSS:  500 mL.  BLOOD GIVEN:  None.  SPECIMENS:  None.  CULTURES:  None.  COMPLICATIONS:  None.  DRESSINGS:  Soft compressive.  DESCRIPTION OF PROCEDURE:  The patient was brought to the operating room and after adequate anesthesia had been obtained, placed on the Hana bed. Prepped and draped in usual sterile fashion.  A longitudinal incision just posterior distal to the ASIS.  Retractors put in place.  Fascia incised, tensor retracted.  Deep fascia capsule taken down.  The femoral head was markedly destroyed, shortened, and partially extruded.  It was cut 1 fingerbreadth above the lesser trochanter.  A napkin ring moved and then the head had to be removed sequentially in pieces as it was just completely destroyed.  Acetabulum exposed.  Brought to good bleeding bone.  Fitted with a 50 mm cup 40 degrees of abduction.  I went as medial as I could.  I was a little bit worried about bone stock given her age.  Confirmed good placement  fluoroscopically.  Good capturing and fixation augmented with 2 screws through the cup.  A 36-mm internal diameter liner.  Utilizing Hana bed traction positioning, the proximal femur was exposed.  Appropriate broaches eventually fitted with a #3 stem.  I then attached the 36- 5 Biolox head.  Hip was reduced.  This gave good leg length equalization.  Good stability.  Confirmed good placement of all components on fluoroscopy.  Retractors removed.  Soft tissue was injected with Exparel.  Subcutaneous and subcuticular closure.  Margins were injected with Marcaine.  Sterile compressive dressing applied.  Taken off the [place] bed.  Anesthesia reversed.  Brought to the recovery room. Tolerated the surgery well.  No complications.     Loreta Aveaniel F. Cristan Hout, M.D.   ______________________________ Loreta Aveaniel F. Antony Sian, M.D.    DFM/MEDQ  D:  02/08/2016  T:  02/09/2016  Job:  [id number]

## 2016-02-09 NOTE — Progress Notes (Signed)
Physical Therapy Treatment Patient Details Name: [name] MRN: [id number] DOB: 11/13/1927 Today's Date: 02/09/2016    History of Present Illness 83 y.o. female admitted to [facility] on 02/08/16 for elective L direct anterior THA.  Pt with significant PMhx of neuropathy, HTN, CKD, aneruysm, L shoulder surgery, R THA (1993), cerebral aneurysm repair, and L ankle fx surgery (2009).    PT Comments    Pt is POD #1 and is moving slowly, but progressing.  She has some significant pelvic mal alignment likely caused by how she was walking PTA making it difficult for her to walk with a more normal gait pattern.  She can with cues.  We initiated her HEP and she continues to be appropriate for SNF level rehab at discharge.    Follow Up Recommendations  SNF;Supervision/Assistance - 24 hour (requesting [facility])     Equipment Recommendations  None recommended by PT    Recommendations for Other Services   NA     Precautions / Restrictions Precautions Precautions: Fall Restrictions Weight Bearing Restrictions: Yes LLE Weight Bearing: Weight bearing as tolerated    Mobility  Bed Mobility               General bed mobility comments: Pt is OOB in the chair  Transfers Overall transfer level: Needs assistance Equipment used: Rolling walker (2 wheeled) Transfers: Sit to/from Stand Sit to Stand: Mod assist;+2 physical assistance Stand pivot transfers: Min assist       General transfer comment: Two person mod assist to get to standing from lower recliner chair.  Verbal cues to scoot closer to the edge before standing, posterior preference throughout transition.  Assist to stabilize the RW and uncontrolled descent when going to sit down.   Ambulation/Gait Ambulation/Gait assistance: +2 safety/equipment;Min assist Ambulation Distance (Feet): 75 Feet Assistive device: Rolling walker (2 wheeled) Gait Pattern/deviations: Step-to pattern;Antalgic Gait velocity: decreased Gait  velocity interpretation: Below normal speed for age/gender General Gait Details: Verbal cues for upright posture, left heel down and left toe forward during gait.  Her pre-surgery gait pattern has thrown her whole pelvis and back out of alighnment making her left leg appear significantly shorter.  Even sitting in the chair her left hip/pelvis is elevated.  Verbal cues to try to sit with better alignment.           Balance Overall balance assessment: Needs assistance Sitting-balance support: Feet supported;No upper extremity supported Sitting balance-Leahy Scale: Fair   Postural control: Posterior lean Standing balance support: Bilateral upper extremity supported Standing balance-Leahy Scale: Poor Standing balance comment: min assist once standing with heavy reliance on bil upper extremities on RW.                     Cognition Arousal/Alertness: Awake/alert Behavior During Therapy: WFL for tasks assessed/performed Overall Cognitive Status: Within Functional Limits for tasks assessed                      Exercises Total Joint Exercises Ankle Circles/Pumps: AROM;Both;20 reps Quad Sets: AROM;Left;10 reps Heel Slides: AAROM;Left;10 reps Hip ABduction/ADduction: AAROM;Left;10 reps        Pertinent Vitals/Pain Pain Assessment: 0-10 Pain Score: 8  Pain Location: left leg and thigh Pain Descriptors / Indicators: Aching;Sore;Grimacing;Guarding Pain Intervention(s): Limited activity within patient's tolerance;Monitored during session;Repositioned    Home Living Family/patient expects to be discharged to:: Skilled nursing facility Living Arrangements: Alone Available Help at Discharge: Family;Available PRN/intermittently Type of Home: House Home Access: Ramped  entrance   Home Layout: One level Home Equipment: Walker - 4 wheels;Shower seat - built in;Bedside commode;Adaptive equipment      Prior Function Level of Independence: Independent with assistive  device(s)      Comments: rolling walker used for gait at baseline, she still drives   PT Goals (current goals can now be found in the care plan section) Acute Rehab PT Goals Patient Stated Goal: to be able to keep up with her grandkids and great grandkids Progress towards PT goals: Progressing toward goals    Frequency    7X/week      PT Plan Current plan remains appropriate       End of Session Equipment Utilized During Treatment: Gait belt Activity Tolerance: Patient limited by pain;Patient limited by fatigue Patient left: in chair;with call bell/phone within reach     Time: 1318-1340 PT Time Calculation (min) (ACUTE ONLY): 22 min  Charges:  $Gait Training: 8-22 mins                      Janijah Symons B. Spike Desilets, PT, DPT [phone]   02/09/2016, 2:34 PM

## 2016-02-09 NOTE — NC FL2 (Signed)
[place] MEDICAID FL2 LEVEL OF CARE SCREENING TOOL     IDENTIFICATION  Patient Name: [name] Birthdate: 17-Dec-1927 Sex: female Admission Date (Current Location): 02/08/2016  [facility] [place]   Facility and Address:  [facility], [address], [place], [place] [address]      Provider Number: [id number]  Attending Physician Name and Address:  Loreta Aveaniel F Murphy, MD  Relative Name and Phone Number:       Current Level of Care: Hospital Recommended Level of Care: Skilled Nursing Facility Prior Approval Number:    Date Approved/Denied: 11/30/15 PASRR Number: [id number]  Discharge Plan: SNF    Current Diagnoses: Patient Active Problem List   Diagnosis Date Noted  . Primary localized osteoarthritis of left hip 02/08/2016  . Benign essential HTN 12/08/2015  . Hypothyroidism 12/05/2015  . Osteoarthritis of left hip 10/20/2012  . Hip pain 06/17/2012  . History of total hip arthroplasty 06/17/2012    Orientation RESPIRATION BLADDER Height & Weight     Self, Time, Situation, Place  O2 (Nasal Cannula; 2L) Incontinent Weight: 198 lb (89.8 kg) Height:  5\' 3"  (160 cm)  BEHAVIORAL SYMPTOMS/MOOD NEUROLOGICAL BOWEL NUTRITION STATUS      Continent  (Please see discharge summary)  AMBULATORY STATUS COMMUNICATION OF NEEDS Skin   Limited Assist Verbally Surgical wounds (Closed incision left hip; adhesive bandage)                       Personal Care Assistance Level of Assistance  Bathing, Feeding, Dressing Bathing Assistance: Limited assistance Feeding assistance: Independent Dressing Assistance: Limited assistance     Functional Limitations Info  Sight, Hearing, Speech Sight Info: Adequate Hearing Info: Adequate Speech Info: Adequate    SPECIAL CARE FACTORS FREQUENCY  PT (By licensed PT), OT (By licensed OT)     PT Frequency: 7x week OT Frequency: 7x week            Contractures Contractures Info: Not  present    Additional Factors Info  Code Status, Allergies Code Status Info: Full Allergies Info: No known allergies           Current Medications (02/09/2016):  This is the current hospital active medication list Current Facility-Administered Medications  Medication Dose Route Frequency Provider Last Rate Last Dose  . 0.9 % NaCl with KCl 20 mEq/ L  infusion   Intravenous Continuous Cristie HemMary L Stanbery, PA-C 50 mL/hr at 02/09/16 [id number]    . acetaminophen (TYLENOL) tablet 650 mg  650 mg Oral Q6H PRN Cristie HemMary L Stanbery, PA-C   650 mg at 02/09/16 [id number]   Or  . acetaminophen (TYLENOL) suppository 650 mg  650 mg Rectal Q6H PRN Cristie HemMary L Stanbery, PA-C      . bisacodyl (DULCOLAX) suppository 10 mg  10 mg Rectal Daily PRN Cristie HemMary L Stanbery, PA-C      . diazepam (VALIUM) tablet 2 mg  2 mg Oral Q8H PRN Cristie HemMary L Stanbery, PA-C   2 mg at 02/09/16 0835  . diphenhydrAMINE (BENADRYL) 12.5 MG/5ML elixir 12.5-25 mg  12.5-25 mg Oral Q4H PRN Cristie HemMary L Stanbery, PA-C      . docusate sodium (COLACE) capsule 100 mg  100 mg Oral BID Cristie HemMary L Stanbery, PA-C   100 mg at 02/09/16 [id number]  . enoxaparin (LOVENOX) injection 40 mg  40 mg Subcutaneous Q24H Cristie HemMary L Stanbery, PA-C   40 mg at 02/09/16 [id number]  . famotidine (PEPCID) tablet 40 mg  40  mg Oral QHS Cristie Hem, PA-C      . furosemide (LASIX) tablet 40 mg  40 mg Oral BH-q7a Loreta Ave, MD   40 mg at 02/09/16 1610   And  . furosemide (LASIX) tablet 20 mg  20 mg Oral QPM Loreta Ave, MD      . gabapentin (NEURONTIN) capsule 900 mg  900 mg Oral TID PRN Cristie Hem, PA-C      . HYDROmorphone (DILAUDID) injection 0.5-1 mg  0.5-1 mg Intravenous Q2H PRN Loreta Ave, MD      . latanoprost (XALATAN) 0.005 % ophthalmic solution 1 drop  1 drop Both Eyes QHS Cristie Hem, PA-C   1 drop at 02/08/16 2131  . levothyroxine (SYNTHROID, LEVOTHROID) tablet 88 mcg  88 mcg Oral QAC breakfast Cristie Hem, PA-C   88 mcg at 02/09/16 9604  . magnesium citrate solution 1 Bottle  1  Bottle Oral Once PRN Cristie Hem, PA-C      . menthol-cetylpyridinium (CEPACOL) lozenge 3 mg  1 lozenge Oral PRN Cristie Hem, PA-C       Or  . phenol (CHLORASEPTIC) mouth spray 1 spray  1 spray Mouth/Throat PRN Cristie Hem, PA-C      . metoCLOPramide (REGLAN) tablet 5-10 mg  5-10 mg Oral Q8H PRN Cristie Hem, PA-C       Or  . metoCLOPramide (REGLAN) injection 5-10 mg  5-10 mg Intravenous Q8H PRN Cristie Hem, PA-C      . metoprolol (LOPRESSOR) tablet 50 mg  50 mg Oral BID Cristie Hem, PA-C   50 mg at 02/08/16 2131  . ondansetron (ZOFRAN) tablet 4 mg  4 mg Oral Q6H PRN Cristie Hem, PA-C       Or  . ondansetron [facility]) injection 4 mg  4 mg Intravenous Q6H PRN Cristie Hem, PA-C      . oxyCODONE (Oxy IR/ROXICODONE) immediate release tablet 5-10 mg  5-10 mg Oral Q3H PRN Cristie Hem, PA-C   5 mg at 02/09/16 5409  . polyethylene glycol (MIRALAX / GLYCOLAX) packet 17 g  17 g Oral Daily PRN Cristie Hem, PA-C      . sertraline (ZOLOFT) tablet 150 mg  150 mg Oral Daily Cristie Hem, PA-C   150 mg at 02/09/16 8119     Discharge Medications: Please see discharge summary for a list of discharge medications.  Relevant Imaging Results:  Relevant Lab Results:   Additional Information SSN: [id number]  Ht: 5\' 3"  (1.6 m) Wt: 198 lb (89.8 kg)   Catcher Dehoyos A Mayton, LCSW

## 2016-02-09 NOTE — Evaluation (Signed)
Occupational Therapy Evaluation Patient Details Name: [name] MRN: [id number] DOB: October 27, 1927 Today's Date: 02/09/2016    History of Present Illness 83 y.o. female [facility] for elective L direct anterior THA.  Pt with significant PMhx of neuropathy, HTN, CKD, aneruysm, L shoulder surgery, R THA (1993), cerebral aneurysm repair, and L ankle fx surgery (2009).   Clinical Impression   Pt with decline in function with ADLs and ADL mobility with decreased strength, balance and endurance. Pt is limited by pain. Pt and family planning for d/c to SNF for rehab. Pt would benefit from acute OT services to address impairments to increase level of function and safety    Follow Up Recommendations  SNF    Equipment Recommendations  None recommended by OT    Recommendations for Other Services       Precautions / Restrictions Precautions Precautions: Fall Restrictions Weight Bearing Restrictions: Yes LLE Weight Bearing: Weight bearing as tolerated      Mobility Bed Mobility               General bed mobility comments: Pt OOB in recliner  Transfers Overall transfer level: Needs assistance Equipment used: Rolling walker (2 wheeled) Transfers: Sit to/from [employer]GI CorporationStand;Stand Pivot Transfers Sit to Stand: Mod assist;From elevated surface Stand pivot transfers: Min assist            Balance Overall balance assessment: Needs assistance Sitting-balance support: Feet supported;Bilateral upper extremity supported Sitting balance-Leahy Scale: Fair   Postural control: Posterior lean Standing balance support: Bilateral upper extremity supported;During functional activity Standing balance-Leahy Scale: Poor                              ADL Overall ADL's : Needs assistance/impaired     Grooming: Wash/dry hands;Wash/dry face;Minimal assistance;Standing   Upper Body Bathing: Min guard;Sitting   Lower Body Bathing: Maximal assistance   Upper Body  Dressing : Min guard;Sitting   Lower Body Dressing: Maximal assistance   Toilet Transfer: Minimal assistance;Moderate assistance;+2 for safety/equipment;Ambulation;RW   Toileting- Clothing Manipulation and Hygiene: Moderate assistance;Sit to/from stand       Functional mobility during ADLs: Minimal assistance;Moderate assistance;Cueing for safety;+2 for safety/equipment       Vision Vision Assessment?: No apparent visual deficits              Pertinent Vitals/Pain Pain Assessment: 0-10 Pain Score: 7  Pain Location: L hip Pain Descriptors / Indicators: Aching;Sore;Grimacing;Guarding Pain Intervention(s): Limited activity within patient's tolerance;Monitored during session;Patient requesting pain meds-RN notified;Repositioned     Hand Dominance Right   Extremity/Trunk Assessment Upper Extremity Assessment Upper Extremity Assessment: Generalized weakness   Lower Extremity Assessment Lower Extremity Assessment: Defer to PT evaluation   Cervical / Trunk Assessment Cervical / Trunk Assessment: Normal   Communication Communication Communication: No difficulties   Cognition Arousal/Alertness: Awake/alert Behavior During Therapy: WFL for tasks assessed/performed Overall Cognitive Status: Within Functional Limits for tasks assessed                     General Comments   pt very pleasant and cooperative                 Home Living Family/patient expects to be discharged to:: Skilled nursing facility Living Arrangements: Alone Available Help at Discharge: Family;Available PRN/intermittently Type of Home: House Home Access: Ramped entrance     Home Layout: One level     Bathroom Shower/Tub: Walk-in  shower   Bathroom Toilet: Standard     Home Equipment: Walker - 4 wheels;Shower seat - built in;Bedside commode;Adaptive equipment Adaptive Equipment: Reacher;Long-handled shoe horn        Prior Functioning/Environment Level of Independence:  Independent with assistive device(s)        Comments: rolling walker used for gait at baseline, she still drives        OT Problem List: Decreased strength;Impaired balance (sitting and/or standing);Pain;Decreased activity tolerance;Obesity   OT Treatment/Interventions: DME and/or AE instruction;Therapeutic activities;Self-care/ADL training;Patient/family education    OT Goals(Current goals can be found in the care plan section) Acute Rehab OT Goals Patient Stated Goal: to be able to keep up with her grandkids and great grandkids OT Goal Formulation: With patient/family Time For Goal Achievement: 02/16/16 Potential to Achieve Goals: Good ADL Goals Pt Will Perform Grooming: with min guard assist;with supervision;standing Pt Will Perform Upper Body Bathing: with supervision;with set-up;sitting Pt Will Perform Lower Body Bathing: with mod assist;with min assist Pt Will Perform Upper Body Dressing: with supervision;with set-up;sitting Pt Will Transfer to Toilet: with min assist;with min guard assist;ambulating Pt Will Perform Toileting - Clothing Manipulation and hygiene: with min assist;sit to/from stand  OT Frequency: Min 2X/week   Barriers to D/C: Decreased caregiver support                        End of Session Equipment Utilized During Treatment: Gait belt;Rolling walker;Other (comment) (3 in 1)  Activity Tolerance: Patient limited by pain Patient left: in chair;with call bell/phone within reach;with nursing/sitter in room;with family/visitor present   Time: 1117-1201 OT Time Calculation (min): 44 min Charges:  OT General Charges $OT Visit: 1 Procedure OT Evaluation $OT Eval Moderate Complexity: 1 Procedure OT Treatments $Self Care/Home Management : 8-22 mins $Therapeutic Activity: 8-22 mins G-Codes:    Galen Manila 02/09/2016, 2:16 PM

## 2016-02-10 LAB — CBC
HEMATOCRIT: 31 % — AB (ref 36.0–46.0)
Hemoglobin: 10.3 g/dL — ABNORMAL LOW (ref 12.0–15.0)
MCH: 30.4 pg (ref 26.0–34.0)
MCHC: 33.2 g/dL (ref 30.0–36.0)
MCV: 91.4 fL (ref 78.0–100.0)
PLATELETS: 200 10*3/uL (ref 150–400)
RBC: 3.39 MIL/uL — ABNORMAL LOW (ref 3.87–5.11)
RDW: 14.7 % (ref 11.5–15.5)
WBC: 7.3 10*3/uL (ref 4.0–10.5)

## 2016-02-10 LAB — BASIC METABOLIC PANEL
Anion gap: 9 (ref 5–15)
BUN: 13 mg/dL (ref 6–20)
CALCIUM: 8.9 mg/dL (ref 8.9–10.3)
CO2: 23 mmol/L (ref 22–32)
CREATININE: 1.02 mg/dL — AB (ref 0.44–1.00)
Chloride: 106 mmol/L (ref 101–111)
GFR calc non Af Amer: 48 mL/min — ABNORMAL LOW (ref >60)
GFR, EST AFRICAN AMERICAN: 56 mL/min — AB (ref >60)
GLUCOSE: 122 mg/dL — AB (ref 65–99)
Potassium: 3.9 mmol/L (ref 3.5–5.1)
Sodium: 138 mmol/L (ref 135–145)

## 2016-02-10 NOTE — Progress Notes (Signed)
Physical Therapy Treatment Patient Details Name: [name] MRN: [id number] DOB: 1927/07/30 Today's Date: 02/10/2016    History of Present Illness 83 y.o. female [facility]/16 for elective L direct anterior THA.  Pt with significant PMhx of neuropathy, HTN, CKD, aneruysm, L shoulder surgery, R THA (1993), cerebral aneurysm repair, and L ankle fx surgery (2009).    PT Comments    Pt able to transfer sit to stand x3 from chair<>toilet<>stretcher with mod A to power to stand. Pt continues to demonstrate significant posterior lean in both sitting and standing as she notes that she is too painful to lean forward over her LLE. Pt with L toe out with ambulation, likely 2/2 pain positioning acquired before current surgery. Requires several VC's to correct throughout session. Pt continues to be an appropriate candidate for SNF to address deficits in strength and mobility before return to home. PT will continue to follow acutely.   Follow Up Recommendations  SNF;Supervision/Assistance - 24 hour (Requesting [facility])     Equipment Recommendations  None recommended by PT    Recommendations for Other Services       Precautions / Restrictions Precautions Precautions: Fall Restrictions Weight Bearing Restrictions: Yes LLE Weight Bearing: Weight bearing as tolerated    Mobility  Bed Mobility               General bed mobility comments: Pt in chair on PT arrival  Transfers Overall transfer level: Needs assistance Equipment used: Rolling walker (2 wheeled) Transfers: Sit to/from Stand Sit to Stand: Mod assist         General transfer comment: Mod assist to support trunk to power up to stand and for anterior weight shift trunk over feet. VCs for hand placement and scoot to edge of chair. Transfered sit<>stand from chairx1 and raised toilet x1 and to stretcher x1   Ambulation/Gait Ambulation/Gait assistance: Min assist Ambulation Distance (Feet): 10 Feet Assistive  device: Rolling walker (2 wheeled) Gait Pattern/deviations: Step-to pattern;Antalgic;Trunk flexed;Narrow base of support Gait velocity: decreased Gait velocity interpretation: Below normal speed for age/gender General Gait Details: VCs for upright posture, L toe forward. Pt notes that her L leg is shorter. Pt has significant pelvic mal-alignment likely 2/2 acquired body habitus with previous hip pain.   Stairs            Wheelchair Mobility    Modified Rankin (Stroke Patients Only)       Balance Overall balance assessment: Needs assistance Sitting-balance support: Feet supported;Bilateral upper extremity supported Sitting balance-Leahy Scale: Fair Sitting balance - Comments: supervision EOB and posterior preference. Postural control: Posterior lean Standing balance support: Bilateral upper extremity supported Standing balance-Leahy Scale: Poor Standing balance comment: min assist once standing for posterior lean with heavy reliance on bil upper extremities on RW.                     Cognition Arousal/Alertness: Awake/alert Behavior During Therapy: WFL for tasks assessed/performed Overall Cognitive Status: Within Functional Limits for tasks assessed                      Exercises      General Comments        Pertinent Vitals/Pain Pain Assessment: Faces Faces Pain Scale: Hurts little more Pain Descriptors / Indicators: Aching;Sore;Grimacing;Guarding Pain Intervention(s): Limited activity within patient's tolerance;Monitored during session;Repositioned    Home Living  Prior Function            PT Goals (current goals can now be found in the care plan section) Acute Rehab PT Goals Patient Stated Goal: to be able to keep up with her grandkids and great grandkids PT Goal Formulation: With patient Time For Goal Achievement: 02/15/16 Potential to Achieve Goals: Good Progress towards PT goals: Progressing toward  goals    Frequency    7X/week      PT Plan Current plan remains appropriate    Co-evaluation             End of Session Equipment Utilized During Treatment: Gait belt Activity Tolerance: Patient tolerated treatment well Patient left: Other (comment) (in stretcher- pt being transferred to SNF today)     Time: 1610-9604 PT Time Calculation (min) (ACUTE ONLY): 21 min  Charges:  $Gait Training: 8-22 mins                    G Codes:      Gaye Pollack 2016-03-07, 11:52 AM Gaye Pollack, SPT [phone]

## 2016-02-10 NOTE — Progress Notes (Signed)
Subjective: 2 Days Post-Op Procedure(s) (LRB): TOTAL HIP ARTHROPLASTY ANTERIOR APPROACH (Left) Patient reports pain as mild.  Nausea/lightheadedness and dizziness are at baseline.  No vomiting.  No chest pain/sob.  Positive flatus, no bm.  Tolerating diet.  Objective: Vital signs in last 24 hours: Temp:  [97.6 F (36.4 C)-98.4 F (36.9 C)] 97.6 F (36.4 C) (01/05 0624) Pulse Rate:  [55-70] 61 (01/05 0624) Resp:  [15-16] 15 (01/05 0624) BP: (97-121)/(32-45) 121/36 (01/05 0624) SpO2:  [95 %-99 %] 97 % (01/05 0624)  Intake/Output from previous day: 01/04 0701 - 01/05 0700 In: 920.8 [P.O.:240; I.V.:680.8] Out: -  Intake/Output this shift: No intake/output data recorded.   Recent Labs  02/08/16 1547 02/09/16 0412  HGB 11.4* 10.4*    Recent Labs  02/08/16 1547 02/09/16 0412  WBC 13.1* 5.8  RBC 3.79* 3.41*  HCT 35.1* 31.7*  PLT 214 188    Recent Labs  02/08/16 1547 02/09/16 0412  NA  --  140  K  --  4.0  CL  --  106  CO2  --  27  BUN  --  12  CREATININE 0.90 0.85  GLUCOSE  --  113*  CALCIUM  --  8.7*   No results for input(s): LABPT, INR in the last 72 hours.  Neurologically intact Neurovascular intact Sensation intact distally Intact pulses distally Dorsiflexion/Plantar flexion intact Incision: dressing C/D/I No cellulitis present Compartment soft  Assessment/Plan: 2 Days Post-Op Procedure(s) (LRB): TOTAL HIP ARTHROPLASTY ANTERIOR APPROACH (Left) Advance diet Up with therapy Discharge to SNF  WBAT LLE-anterior hip precautions ABLA-mild and stable Dry dressing change prn Encouraged use of compression stockings to help prevent DVT Patient and family wish to be d/c to SNF rather than home.  I have educated patient on new studies which show increased risk of complications, specifically infection when at rehab facility.  Patient understands.  Otilio SaberM Lindsey Stanbery 02/10/2016, 8:24 AM

## 2016-02-10 NOTE — Clinical Social Work Note (Signed)
Clinical Social Worker facilitated patient discharge including contacting patient family and facility to confirm patient discharge plans.  Clinical information faxed to facility and family agreeable with plan.  CSW arranged ambulance transport via [facility] to [facility], [place] .  RN to call [phone] for report prior to discharge. Patient will be going to room 612.  Clinical Social Worker will sign off for now as social work intervention is no longer needed. Please consult us again if new need arises.  [address][name], [place] [phone]

## 2016-02-10 NOTE — Progress Notes (Signed)
Report called to Danny Nash-RN at [facility]. All questions/conserns addressed. IV removed and belongings gathered. [facility] to transport

## 2016-07-06 HISTORY — PX: KNEE SURGERY: SHX244

## 2016-07-06 NOTE — H&P (Signed)
TOTAL KNEE ADMISSION H&P  Patient is being admitted for right total knee arthroplasty.  Subjective:  Chief Complaint:right knee pain.  HPI: [name], 83 y.o. female, has a history of pain and functional disability in the right knee due to arthritis and has failed non-surgical conservative treatments for greater than 12 weeks to includeNSAID's and/or analgesics, corticosteriod injections and use of assistive devices.  Onset of symptoms was gradual, starting 5 years ago with gradually worsening course since that time. The patient noted no past surgery on the right knee(s).  Patient currently rates pain in the right knee(s) at 5 out of 10 with activity. Patient has night pain, worsening of pain with activity and weight bearing, pain that interferes with activities of daily living, pain with passive range of motion, crepitus and joint swelling.  Patient has evidence of subchondral sclerosis, joint subluxation and joint space narrowing by imaging studies. There is no active infection.  Patient Active Problem List   Diagnosis Date Noted  . Primary localized osteoarthritis of left hip 02/08/2016  . Benign essential HTN 12/08/2015  . Hypothyroidism 12/05/2015  . Osteoarthritis of left hip 10/20/2012  . Hip pain 06/17/2012  . History of total hip arthroplasty 06/17/2012   Past Medical History:  Diagnosis Date  . Aneurysm (HCC)    brain  rt side  . Arthritis   . Chronic kidney disease    stage 3  . GERD (gastroesophageal reflux disease)   . Heart murmur    years ago asymp  . History of hiatal hernia   . Hypertension   . Hypothyroidism   . Neuropathy [facility](HCC)     Past Surgical History:  Procedure Laterality Date  . ABDOMINAL HYSTERECTOMY     1973  . ANKLE FRACTURE SURGERY     left 2009  . CATARACT EXTRACTION, BILATERAL    . CEREBRAL ANEURYSM REPAIR     2009  . CHOLECYSTECTOMY    . JOINT REPLACEMENT     rt hip 1993  . SHOULDER SURGERY     left 2003  . TOTAL HIP ARTHROPLASTY  Left 02/08/2016   Procedure: TOTAL HIP ARTHROPLASTY ANTERIOR APPROACH;  Surgeon: Loreta Aveaniel F Murphy, MD;  [facility];  Service: Orthopedics;  Laterality: Left;    No prescriptions prior to admission.   No Known Allergies  Social History  Substance Use Topics  . Smoking status: Never Smoker  . Smokeless tobacco: Never Used  . Alcohol use No    No family history on file.   Review of Systems  Constitutional: Negative.   HENT: Positive for hearing loss.   Eyes: Negative.   Respiratory: Negative.   Cardiovascular: Negative.   Gastrointestinal: Negative.   Genitourinary: Negative.   Musculoskeletal: Positive for joint pain.  Skin: Negative.   Neurological: Negative.   Endo/Heme/Allergies: Negative.   Psychiatric/Behavioral: Positive for memory loss.    Objective:  Physical Exam  Constitutional: She is oriented to person, place, and time. She appears well-developed and well-nourished.  HENT:  Head: Normocephalic and atraumatic.  Eyes: EOM are normal. Pupils are equal, round, and reactive to light.  Neck: Normal range of motion. Neck supple.  Cardiovascular: Normal rate and regular rhythm.   Respiratory: Effort normal and breath sounds normal.  GI: Soft. Bowel sounds are normal.  Musculoskeletal:  Right knee has 15 or more degrees of valgus that is partially correctable.  Marked tibiofemoral and patellofemoral crepitus.  She is neurovascularly intact distally.    Neurological: She is alert and oriented to  person, place, and time.  Skin: Skin is warm and dry.  Psychiatric: She has a normal mood and affect. Her behavior is normal. Judgment and thought content normal.    Vital signs in last 24 hours:    Labs:   Estimated body mass index is 35.07 kg/m as calculated from the following:   Height as of 02/08/16: 5\' 3"  (1.6 m).   Weight as of 02/08/16: 89.8 kg (198 lb).   Imaging Review Plain radiographs demonstrate severe degenerative joint disease of the right knee(s). The  overall alignment issignificant valgus. The bone quality appears to be fair for age and reported activity level.  Assessment/Plan:  End stage arthritis, right knee   The patient history, physical examination, clinical judgment of the provider and imaging studies are consistent with end stage degenerative joint disease of the right knee(s) and total knee arthroplasty is deemed medically necessary. The treatment options including medical management, injection therapy arthroscopy and arthroplasty were discussed at length. The risks and benefits of total knee arthroplasty were presented and reviewed. The risks due to aseptic loosening, infection, stiffness, patella tracking problems, thromboembolic complications and other imponderables were discussed. The patient acknowledged the explanation, agreed to proceed with the plan and consent was signed. Patient is being admitted for inpatient treatment for surgery, pain control, PT, OT, prophylactic antibiotics, VTE prophylaxis, progressive ambulation and ADL's and discharge planning. The patient is planning to be discharged home with home health services

## 2016-07-13 NOTE — Pre-Procedure Instructions (Signed)
    [name]  07/13/2016      [facility] [place] [facility] [id number] [facility] [address] [place] [id number] Phone: [phone] Fax: [phone]  [facility] [place] [address] [place] [id number] Phone: [phone] Fax: [phone]    Your procedure is scheduled on 07/25/16.  [facility]mitting at 850 A.M.  Call this number if you have problems the morning of surgery:  [phone]   Remember:  Do not eat food or drink liquids after midnight.  Take these medicines the morning of surgery with A SIP OF WATER --tylenol,hydrocodone,synthroid,metoprolol   Do not wear jewelry, make-up or nail polish.  Do not wear lotions, powders, or perfumes, or deoderant.  Do not shave 48 hours prior to surgery.  Men may shave face and neck.  Do not bring valuables to the hospital.  [facility] is not responsible for any belongings or valuables.  Contacts, dentures or bridgework may not be worn into surgery.  Leave your suitcase in the car.  After surgery it may be brought to your room.  For patients admitted to the hospital, discharge time will be determined by your treatment team.  Patients discharged the day of surgery will not be allowed to drive home.   Name and phone number of your driver:    Special instructions:  Do not take any aspirin,anti-inflammatories,vitamins,or herbal supplements 5-7 days prior to surgery.  Please read over the following fact sheets that you were given. MRSA Information

## 2016-07-16 ENCOUNTER — Encounter (HOSPITAL_COMMUNITY)
Admission: RE | Admit: 2016-07-16 | Discharge: 2016-07-16 | Disposition: A | Discharge status: Home / Self Care | Payer: Medicare Other | Source: Ambulatory Visit | Attending: Orthopedic Surgery | Admitting: Orthopedic Surgery

## 2016-07-16 ENCOUNTER — Encounter (HOSPITAL_COMMUNITY): Payer: Self-pay

## 2016-07-16 DIAGNOSIS — Z01812 Encounter for preprocedural laboratory examination: Secondary | ICD-10-CM | POA: Insufficient documentation

## 2016-07-16 HISTORY — DX: Anxiety disorder, unspecified: F41.9

## 2016-07-16 HISTORY — DX: Dyspnea, unspecified: R06.00

## 2016-07-16 LAB — BASIC METABOLIC PANEL
Anion gap: 7 (ref 5–15)
BUN: 22 mg/dL — AB (ref 6–20)
CO2: 28 mmol/L (ref 22–32)
CREATININE: 1.06 mg/dL — AB (ref 0.44–1.00)
Calcium: 9.8 mg/dL (ref 8.9–10.3)
Chloride: 105 mmol/L (ref 101–111)
GFR calc Af Amer: 53 mL/min — ABNORMAL LOW (ref >60)
GFR, EST NON AFRICAN AMERICAN: 46 mL/min — AB (ref >60)
Glucose, Bld: 93 mg/dL (ref 65–99)
Potassium: 4.9 mmol/L (ref 3.5–5.1)
SODIUM: 140 mmol/L (ref 135–145)

## 2016-07-16 LAB — CBC
HCT: 38.9 % (ref 36.0–46.0)
Hemoglobin: 12.4 g/dL (ref 12.0–15.0)
MCH: 29.6 pg (ref 26.0–34.0)
MCHC: 31.9 g/dL (ref 30.0–36.0)
MCV: 92.8 fL (ref 78.0–100.0)
PLATELETS: 256 10*3/uL (ref 150–400)
RBC: 4.19 MIL/uL (ref 3.87–5.11)
RDW: 14.9 % (ref 11.5–15.5)
WBC: 6 10*3/uL (ref 4.0–10.5)

## 2016-07-16 LAB — URINALYSIS, ROUTINE W REFLEX MICROSCOPIC
Bilirubin Urine: NEGATIVE
GLUCOSE, UA: NEGATIVE mg/dL
HGB URINE DIPSTICK: NEGATIVE
Ketones, ur: NEGATIVE mg/dL
Leukocytes, UA: NEGATIVE
Nitrite: NEGATIVE
Protein, ur: NEGATIVE mg/dL
SPECIFIC GRAVITY, URINE: 1.016 (ref 1.005–1.030)
pH: 6 (ref 5.0–8.0)

## 2016-07-16 LAB — TYPE AND SCREEN
ABO/RH(D): O POS
Antibody Screen: NEGATIVE

## 2016-07-16 LAB — SURGICAL PCR SCREEN
MRSA, PCR: NEGATIVE
Staphylococcus aureus: NEGATIVE

## 2016-07-17 LAB — URINE CULTURE

## 2016-07-24 MED ORDER — CEFAZOLIN SODIUM-DEXTROSE 2-4 GM/100ML-% IV SOLN
2.0000 g | INTRAVENOUS | Status: AC
  Administered 2016-07-25: 2 g via INTRAVENOUS
  Filled 2016-07-24: qty 100

## 2016-07-24 MED ORDER — TRANEXAMIC ACID 1000 MG/10ML IV SOLN
2000.0000 mg | INTRAVENOUS | Status: AC
  Administered 2016-07-25: 2000 mg via TOPICAL
  Filled 2016-07-24: qty 20

## 2016-07-25 ENCOUNTER — Inpatient Hospital Stay (HOSPITAL_COMMUNITY): Payer: Medicare Other

## 2016-07-25 ENCOUNTER — Inpatient Hospital Stay (HOSPITAL_COMMUNITY): Payer: Medicare Other | Admitting: Certified Registered"

## 2016-07-25 ENCOUNTER — Encounter (HOSPITAL_COMMUNITY)
Admission: RE | Disposition: A | Discharge status: Home Health | Payer: Self-pay | Source: Ambulatory Visit | Attending: Orthopedic Surgery

## 2016-07-25 ENCOUNTER — Inpatient Hospital Stay (HOSPITAL_COMMUNITY)
Admission: RE | Admit: 2016-07-25 | Discharge: 2016-07-27 | DRG: 470 | Disposition: A | Discharge status: Home Health | Payer: Medicare Other | Source: Ambulatory Visit | Attending: Orthopedic Surgery | Admitting: Orthopedic Surgery

## 2016-07-25 ENCOUNTER — Encounter (HOSPITAL_COMMUNITY): Payer: Self-pay

## 2016-07-25 DIAGNOSIS — N183 Chronic kidney disease, stage 3 (moderate): Secondary | ICD-10-CM | POA: Diagnosis present

## 2016-07-25 DIAGNOSIS — Z96659 Presence of unspecified artificial knee joint: Secondary | ICD-10-CM

## 2016-07-25 DIAGNOSIS — Z96642 Presence of left artificial hip joint: Secondary | ICD-10-CM | POA: Diagnosis present

## 2016-07-25 DIAGNOSIS — M25561 Pain in right knee: Secondary | ICD-10-CM | POA: Diagnosis present

## 2016-07-25 DIAGNOSIS — E039 Hypothyroidism, unspecified: Secondary | ICD-10-CM | POA: Diagnosis present

## 2016-07-25 DIAGNOSIS — K219 Gastro-esophageal reflux disease without esophagitis: Secondary | ICD-10-CM | POA: Diagnosis present

## 2016-07-25 DIAGNOSIS — I129 Hypertensive chronic kidney disease with stage 1 through stage 4 chronic kidney disease, or unspecified chronic kidney disease: Secondary | ICD-10-CM | POA: Diagnosis present

## 2016-07-25 DIAGNOSIS — M1711 Unilateral primary osteoarthritis, right knee: Secondary | ICD-10-CM | POA: Diagnosis present

## 2016-07-25 DIAGNOSIS — M8588 Other specified disorders of bone density and structure, other site: Secondary | ICD-10-CM | POA: Diagnosis present

## 2016-07-25 DIAGNOSIS — I10 Essential (primary) hypertension: Secondary | ICD-10-CM | POA: Diagnosis present

## 2016-07-25 HISTORY — PX: TOTAL KNEE ARTHROPLASTY: SHX125

## 2016-07-25 SURGERY — ARTHROPLASTY, KNEE, TOTAL
Anesthesia: Monitor Anesthesia Care | Site: Knee | Laterality: Right

## 2016-07-25 MED ORDER — POLYVINYL ALCOHOL 1.4 % OP SOLN: 1.0000 [drp] | Freq: Three times a day (TID) | OPHTHALMIC | Status: DC | PRN

## 2016-07-25 MED ORDER — ONDANSETRON HCL 4 MG PO TABS: 4.0000 mg | ORAL_TABLET | Freq: Four times a day (QID) | ORAL | Status: DC | PRN

## 2016-07-25 MED ORDER — METOPROLOL TARTRATE 25 MG PO TABS
25.0000 mg | ORAL_TABLET | Freq: Two times a day (BID) | ORAL | Status: DC
  Administered 2016-07-25 – 2016-07-27 (×3): 25 mg via ORAL
  Filled 2016-07-25 (×4): qty 1

## 2016-07-25 MED ORDER — GLYCOPYRROLATE 0.2 MG/ML IJ SOLN
INTRAMUSCULAR | Status: DC | PRN
  Administered 2016-07-25: 0.2 mg via INTRAVENOUS

## 2016-07-25 MED ORDER — FUROSEMIDE 20 MG PO TABS: 20.0000 mg | ORAL_TABLET | Freq: Every day | ORAL | Status: DC | PRN

## 2016-07-25 MED ORDER — PROMETHAZINE HCL 25 MG/ML IJ SOLN: 6.2500 mg | INTRAMUSCULAR | Status: DC | PRN

## 2016-07-25 MED ORDER — OXYCODONE HCL 5 MG/5ML PO SOLN: 5.0000 mg | Freq: Once | ORAL | Status: DC | PRN

## 2016-07-25 MED ORDER — PROPOFOL 1000 MG/100ML IV EMUL
INTRAVENOUS | Status: AC
  Filled 2016-07-25: qty 100

## 2016-07-25 MED ORDER — CHLORHEXIDINE GLUCONATE 4 % EX LIQD: 60.0000 mL | Freq: Once | CUTANEOUS | Status: DC

## 2016-07-25 MED ORDER — CEFAZOLIN SODIUM-DEXTROSE 2-4 GM/100ML-% IV SOLN
2.0000 g | Freq: Four times a day (QID) | INTRAVENOUS | Status: AC
  Administered 2016-07-25 (×2): 2 g via INTRAVENOUS
  Filled 2016-07-25 (×3): qty 100

## 2016-07-25 MED ORDER — LATANOPROST 0.005 % OP SOLN
1.0000 [drp] | Freq: Every day | OPHTHALMIC | Status: DC
  Administered 2016-07-25 – 2016-07-27 (×2): 1 [drp] via OPHTHALMIC
  Filled 2016-07-25: qty 2.5

## 2016-07-25 MED ORDER — ONDANSETRON HCL 4 MG/2ML IJ SOLN: 4.0000 mg | Freq: Four times a day (QID) | INTRAMUSCULAR | Status: DC | PRN

## 2016-07-25 MED ORDER — DIPHENHYDRAMINE HCL 12.5 MG/5ML PO ELIX: 12.5000 mg | ORAL_SOLUTION | ORAL | Status: DC | PRN

## 2016-07-25 MED ORDER — PHENOL 1.4 % MT LIQD: 1.0000 | OROMUCOSAL | Status: DC | PRN

## 2016-07-25 MED ORDER — ONDANSETRON HCL 4 MG PO TABS: 4.0000 mg | ORAL_TABLET | Freq: Three times a day (TID) | ORAL | 0 refills | Status: DC | PRN

## 2016-07-25 MED ORDER — FENTANYL CITRATE (PF) 100 MCG/2ML IJ SOLN
100.0000 ug | Freq: Once | INTRAMUSCULAR | Status: AC
  Administered 2016-07-25: 50 ug via INTRAVENOUS
  Filled 2016-07-25 (×2): qty 2

## 2016-07-25 MED ORDER — PROPOFOL 10 MG/ML IV BOLUS
INTRAVENOUS | Status: DC | PRN
  Administered 2016-07-25: 20 mg via INTRAVENOUS

## 2016-07-25 MED ORDER — OXYCODONE HCL 5 MG PO TABS: 5.0000 mg | ORAL_TABLET | Freq: Once | ORAL | Status: DC | PRN

## 2016-07-25 MED ORDER — DEXMEDETOMIDINE HCL IN NACL 200 MCG/50ML IV SOLN
INTRAVENOUS | Status: DC | PRN
  Administered 2016-07-25: 12 ug via INTRAVENOUS

## 2016-07-25 MED ORDER — HYDROMORPHONE HCL 1 MG/ML IJ SOLN
0.2500 mg | INTRAMUSCULAR | Status: DC | PRN
  Administered 2016-07-25: 0.5 mg via INTRAVENOUS

## 2016-07-25 MED ORDER — ROPIVACAINE HCL 5 MG/ML IJ SOLN
INTRAMUSCULAR | Status: DC | PRN
  Administered 2016-07-25: 20 mL via PERINEURAL

## 2016-07-25 MED ORDER — METOCLOPRAMIDE HCL 5 MG/ML IJ SOLN: 5.0000 mg | Freq: Three times a day (TID) | INTRAMUSCULAR | Status: DC | PRN

## 2016-07-25 MED ORDER — CELECOXIB 200 MG PO CAPS
200.0000 mg | ORAL_CAPSULE | Freq: Two times a day (BID) | ORAL | Status: DC
  Administered 2016-07-26 – 2016-07-27 (×3): 200 mg via ORAL
  Filled 2016-07-25 (×3): qty 1

## 2016-07-25 MED ORDER — BUPIVACAINE LIPOSOME 1.3 % IJ SUSP
20.0000 mL | Freq: Once | INTRAMUSCULAR | Status: AC
  Administered 2016-07-25: 20 mL
  Filled 2016-07-25: qty 20

## 2016-07-25 MED ORDER — ACETAMINOPHEN 650 MG RE SUPP: 650.0000 mg | Freq: Four times a day (QID) | RECTAL | Status: DC | PRN

## 2016-07-25 MED ORDER — DEXMEDETOMIDINE HCL IN NACL 200 MCG/50ML IV SOLN
INTRAVENOUS | Status: AC
  Filled 2016-07-25: qty 50

## 2016-07-25 MED ORDER — HYDROMORPHONE HCL 1 MG/ML IJ SOLN
INTRAMUSCULAR | Status: AC
  Filled 2016-07-25: qty 0.5

## 2016-07-25 MED ORDER — ONDANSETRON HCL 4 MG/2ML IJ SOLN
INTRAMUSCULAR | Status: AC
  Filled 2016-07-25: qty 2

## 2016-07-25 MED ORDER — METOCLOPRAMIDE HCL 5 MG PO TABS: 5.0000 mg | ORAL_TABLET | Freq: Three times a day (TID) | ORAL | Status: DC | PRN

## 2016-07-25 MED ORDER — PROPOFOL 500 MG/50ML IV EMUL
INTRAVENOUS | Status: DC | PRN
  Administered 2016-07-25: 50 ug/kg/min via INTRAVENOUS

## 2016-07-25 MED ORDER — EPHEDRINE 5 MG/ML INJ
INTRAVENOUS | Status: AC
  Filled 2016-07-25: qty 10

## 2016-07-25 MED ORDER — LACTATED RINGERS IV SOLN
INTRAVENOUS | Status: DC
  Administered 2016-07-25: 10:00:00 via INTRAVENOUS

## 2016-07-25 MED ORDER — CARBOXYMETHYLCELLULOSE SODIUM 0.5 % OP SOLN: 1.0000 [drp] | Freq: Three times a day (TID) | OPHTHALMIC | Status: DC | PRN

## 2016-07-25 MED ORDER — HYDROCODONE-ACETAMINOPHEN 7.5-325 MG PO TABS
1.0000 | ORAL_TABLET | ORAL | Status: DC | PRN
  Administered 2016-07-25 (×2): 2 via ORAL
  Administered 2016-07-25: 1 via ORAL
  Administered 2016-07-26: 2 via ORAL
  Filled 2016-07-25: qty 1
  Filled 2016-07-25 (×2): qty 2

## 2016-07-25 MED ORDER — DIAZEPAM 2 MG PO TABS
2.0000 mg | ORAL_TABLET | Freq: Four times a day (QID) | ORAL | Status: DC | PRN
  Administered 2016-07-25 – 2016-07-26 (×3): 2 mg via ORAL
  Filled 2016-07-25 (×4): qty 1

## 2016-07-25 MED ORDER — POTASSIUM CHLORIDE IN NACL 20-0.9 MEQ/L-% IV SOLN
INTRAVENOUS | Status: DC
  Administered 2016-07-25 – 2016-07-26 (×2): via INTRAVENOUS
  Filled 2016-07-25 (×2): qty 1000

## 2016-07-25 MED ORDER — SODIUM CHLORIDE 0.9 % IJ SOLN
INTRAMUSCULAR | Status: DC | PRN
  Administered 2016-07-25: 40 mL

## 2016-07-25 MED ORDER — FAMOTIDINE 40 MG PO TABS
40.0000 mg | ORAL_TABLET | ORAL | Status: DC
  Administered 2016-07-26: 40 mg via ORAL
  Filled 2016-07-25: qty 1
  Filled 2016-07-25: qty 2
  Filled 2016-07-25: qty 1

## 2016-07-25 MED ORDER — ACETAMINOPHEN 325 MG PO TABS
650.0000 mg | ORAL_TABLET | Freq: Four times a day (QID) | ORAL | Status: DC | PRN
  Administered 2016-07-26: 650 mg via ORAL
  Filled 2016-07-25: qty 2

## 2016-07-25 MED ORDER — ASPIRIN EC 325 MG PO TBEC: 325.0000 mg | DELAYED_RELEASE_TABLET | Freq: Every day | ORAL | 0 refills | Status: DC

## 2016-07-25 MED ORDER — HYDROCODONE-ACETAMINOPHEN 7.5-325 MG PO TABS
ORAL_TABLET | ORAL | Status: AC
  Filled 2016-07-25: qty 2

## 2016-07-25 MED ORDER — MAGNESIUM CITRATE PO SOLN: 1.0000 | Freq: Once | ORAL | Status: DC | PRN

## 2016-07-25 MED ORDER — BISACODYL 5 MG PO TBEC: 5.0000 mg | DELAYED_RELEASE_TABLET | Freq: Every day | ORAL | Status: DC | PRN

## 2016-07-25 MED ORDER — ASPIRIN EC 325 MG PO TBEC
325.0000 mg | DELAYED_RELEASE_TABLET | Freq: Every day | ORAL | Status: DC
  Administered 2016-07-26 – 2016-07-27 (×2): 325 mg via ORAL
  Filled 2016-07-25 (×2): qty 1

## 2016-07-25 MED ORDER — SERTRALINE HCL 100 MG PO TABS
100.0000 mg | ORAL_TABLET | Freq: Every day | ORAL | Status: DC
  Administered 2016-07-26 – 2016-07-27 (×2): 100 mg via ORAL
  Filled 2016-07-25 (×2): qty 1

## 2016-07-25 MED ORDER — SERTRALINE HCL 50 MG PO TABS
50.0000 mg | ORAL_TABLET | Freq: Every evening | ORAL | Status: DC
  Administered 2016-07-26: 50 mg via ORAL
  Filled 2016-07-25: qty 1

## 2016-07-25 MED ORDER — HYDROCODONE-ACETAMINOPHEN 5-325 MG PO TABS: 1.0000 | ORAL_TABLET | ORAL | 0 refills | Status: DC | PRN

## 2016-07-25 MED ORDER — DOCUSATE SODIUM 100 MG PO CAPS
100.0000 mg | ORAL_CAPSULE | Freq: Two times a day (BID) | ORAL | Status: DC
  Administered 2016-07-25 – 2016-07-27 (×4): 100 mg via ORAL
  Filled 2016-07-25 (×4): qty 1

## 2016-07-25 MED ORDER — DEXAMETHASONE SODIUM PHOSPHATE 10 MG/ML IJ SOLN
10.0000 mg | Freq: Once | INTRAMUSCULAR | Status: AC
  Administered 2016-07-26: 10 mg via INTRAVENOUS
  Filled 2016-07-25: qty 1

## 2016-07-25 MED ORDER — ONDANSETRON HCL 4 MG/2ML IJ SOLN
INTRAMUSCULAR | Status: DC | PRN
  Administered 2016-07-25: 4 mg via INTRAVENOUS

## 2016-07-25 MED ORDER — MENTHOL 3 MG MT LOZG: 1.0000 | LOZENGE | OROMUCOSAL | Status: DC | PRN

## 2016-07-25 MED ORDER — PHENYLEPHRINE HCL 10 MG/ML IJ SOLN
INTRAVENOUS | Status: DC | PRN
  Administered 2016-07-25: 25 ug/min via INTRAVENOUS

## 2016-07-25 MED ORDER — MIDAZOLAM HCL 2 MG/2ML IJ SOLN
2.0000 mg | Freq: Once | INTRAMUSCULAR | Status: AC
  Administered 2016-07-25: 1 mg via INTRAVENOUS
  Filled 2016-07-25 (×2): qty 2

## 2016-07-25 MED ORDER — LEVOTHYROXINE SODIUM 88 MCG PO TABS
88.0000 ug | ORAL_TABLET | Freq: Every day | ORAL | Status: DC
  Administered 2016-07-26 – 2016-07-27 (×2): 88 ug via ORAL
  Filled 2016-07-25 (×2): qty 1

## 2016-07-25 MED ORDER — POLYETHYLENE GLYCOL 3350 17 G PO PACK: 17.0000 g | PACK | Freq: Every day | ORAL | Status: DC | PRN

## 2016-07-25 MED ORDER — BUPIVACAINE HCL (PF) 0.75 % IJ SOLN
INTRAMUSCULAR | Status: DC | PRN
  Administered 2016-07-25: 2 mL via INTRATHECAL

## 2016-07-25 SURGICAL SUPPLY — 68 items
APL SKNCLS STERI-STRIP NONHPOA (GAUZE/BANDAGES/DRESSINGS) ×1
BANDAGE ACE 4X5 VEL STRL LF (GAUZE/BANDAGES/DRESSINGS) ×3 IMPLANT
BANDAGE ACE 6X5 VEL STRL LF (GAUZE/BANDAGES/DRESSINGS) ×3 IMPLANT
BANDAGE ESMARK 6X9 LF (GAUZE/BANDAGES/DRESSINGS) ×1 IMPLANT
BENZOIN TINCTURE PRP APPL 2/3 (GAUZE/BANDAGES/DRESSINGS) ×3 IMPLANT
BLADE SAG 18X100X1.27 (BLADE) ×6 IMPLANT
BNDG CMPR 9X6 STRL LF SNTH (GAUZE/BANDAGES/DRESSINGS) ×1
BNDG ESMARK 6X9 LF (GAUZE/BANDAGES/DRESSINGS) ×3
BOWL SMART MIX CTS (DISPOSABLE) ×5 IMPLANT
CAPT KNEE TOTAL 3 ×2 IMPLANT
CEMENT BONE SIMPLEX SPEEDSET (Cement) ×6 IMPLANT
CLOSURE STERI-STRIP 1/2X4 (GAUZE/BANDAGES/DRESSINGS) ×1
CLOSURE WOUND 1/2 X4 (GAUZE/BANDAGES/DRESSINGS) ×2
CLSR STERI-STRIP ANTIMIC 1/2X4 (GAUZE/BANDAGES/DRESSINGS) ×1 IMPLANT
COVER SURGICAL LIGHT HANDLE (MISCELLANEOUS) ×3 IMPLANT
CUFF TOURNIQUET SINGLE 34IN LL (TOURNIQUET CUFF) ×3 IMPLANT
DRAPE EXTREMITY T 121X128X90 (DRAPE) ×3 IMPLANT
DRAPE HALF SHEET 40X57 (DRAPES) ×6 IMPLANT
DRAPE IMP U-DRAPE 54X76 (DRAPES) ×3 IMPLANT
DRAPE U-SHAPE 47X51 STRL (DRAPES) ×3 IMPLANT
DRSG AQUACEL AG ADV 3.5X10 (GAUZE/BANDAGES/DRESSINGS) ×3 IMPLANT
DURAPREP 26ML APPLICATOR (WOUND CARE) ×6 IMPLANT
ELECT CAUTERY BLADE 6.4 (BLADE) ×3 IMPLANT
ELECT REM PT RETURN 9FT ADLT (ELECTROSURGICAL) ×3
ELECTRODE REM PT RTRN 9FT ADLT (ELECTROSURGICAL) ×1 IMPLANT
EVACUATOR 1/8 PVC DRAIN (DRAIN) IMPLANT
FACESHIELD WRAPAROUND (MASK) ×6 IMPLANT
FACESHIELD WRAPAROUND OR TEAM (MASK) ×2 IMPLANT
GLOVE BIOGEL PI IND STRL 7.0 (GLOVE) ×1 IMPLANT
GLOVE BIOGEL PI INDICATOR 7.0 (GLOVE) ×2
GLOVE ORTHO TXT STRL SZ7.5 (GLOVE) ×3 IMPLANT
GLOVE SURG ORTHO 7.0 STRL STRW (GLOVE) ×3 IMPLANT
GOWN STRL REUS W/ TWL LRG LVL3 (GOWN DISPOSABLE) ×2 IMPLANT
GOWN STRL REUS W/ TWL XL LVL3 (GOWN DISPOSABLE) ×1 IMPLANT
GOWN STRL REUS W/TWL LRG LVL3 (GOWN DISPOSABLE) ×6
GOWN STRL REUS W/TWL XL LVL3 (GOWN DISPOSABLE) ×3
HANDPIECE INTERPULSE COAX TIP (DISPOSABLE) ×3
IMMOBILIZER KNEE 22 UNIV (SOFTGOODS) ×3 IMPLANT
IMMOBILIZER KNEE 24 THIGH 36 (MISCELLANEOUS) IMPLANT
IMMOBILIZER KNEE 24 UNIV (MISCELLANEOUS)
KIT BASIN OR (CUSTOM PROCEDURE TRAY) ×3 IMPLANT
KIT ROOM TURNOVER OR (KITS) ×3 IMPLANT
MANIFOLD NEPTUNE II (INSTRUMENTS) ×3 IMPLANT
NDL 18GX1X1/2 (RX/OR ONLY) (NEEDLE) ×1 IMPLANT
NDL HYPO 25GX1X1/2 BEV (NEEDLE) ×1 IMPLANT
NEEDLE 18GX1X1/2 (RX/OR ONLY) (NEEDLE) ×3 IMPLANT
NEEDLE HYPO 25GX1X1/2 BEV (NEEDLE) ×3 IMPLANT
NS IRRIG 1000ML POUR BTL (IV SOLUTION) ×3 IMPLANT
PACK TOTAL JOINT (CUSTOM PROCEDURE TRAY) ×3 IMPLANT
PACK UNIVERSAL I (CUSTOM PROCEDURE TRAY) ×3 IMPLANT
PAD ARMBOARD 7.5X6 YLW CONV (MISCELLANEOUS) ×6 IMPLANT
SET HNDPC FAN SPRY TIP SCT (DISPOSABLE) ×1 IMPLANT
STRIP CLOSURE SKIN 1/2X4 (GAUZE/BANDAGES/DRESSINGS) ×4 IMPLANT
SUCTION FRAZIER HANDLE 10FR (MISCELLANEOUS) ×2
SUCTION TUBE FRAZIER 10FR DISP (MISCELLANEOUS) ×1 IMPLANT
SUT MNCRL AB 4-0 PS2 18 (SUTURE) ×3 IMPLANT
SUT VIC AB 0 CT1 27 (SUTURE)
SUT VIC AB 0 CT1 27XBRD ANBCTR (SUTURE) IMPLANT
SUT VIC AB 1 CTX 36 (SUTURE) ×3
SUT VIC AB 1 CTX36XBRD ANBCTR (SUTURE) ×1 IMPLANT
SUT VIC AB 2-0 CT1 27 (SUTURE) ×6
SUT VIC AB 2-0 CT1 TAPERPNT 27 (SUTURE) ×2 IMPLANT
SYR 50ML LL SCALE MARK (SYRINGE) ×3 IMPLANT
SYR CONTROL 10ML LL (SYRINGE) ×3 IMPLANT
TOWEL OR 17X24 6PK STRL BLUE (TOWEL DISPOSABLE) ×3 IMPLANT
TOWEL OR 17X26 10 PK STRL BLUE (TOWEL DISPOSABLE) ×3 IMPLANT
TRAY CATH 16FR W/PLASTIC CATH (SET/KITS/TRAYS/PACK) IMPLANT
WATER STERILE IRR 1000ML POUR (IV SOLUTION) ×3 IMPLANT

## 2016-07-25 NOTE — Discharge Summary (Addendum)
Patient ID: Connie Haringancy L Dicamillo MRN: [id number] DOB/AGE: 81/12/1927 81 y.o.  Admit date: 07/25/2016 Discharge date: 07/27/2016  Admission Diagnoses:  Principal Problem:   Primary localized osteoarthritis of right knee Active Problems:   Benign essential HTN   Discharge Diagnoses:  Same  Past Medical History:  Diagnosis Date  . Aneurysm (HCC)    brain  rt side  . Anxiety   . Arthritis   . Chronic kidney disease    stage 3  . Dyspnea   . GERD (gastroesophageal reflux disease)   . Heart murmur    years ago asymp  . History of hiatal hernia   . Hypertension   . Hypothyroidism   . Neuropathy     Surgeries: Procedure(s): TOTAL KNEE ARTHROPLASTY on 07/25/2016   Consultants:   Discharged Condition: Improved  Hospital Course: [name] is an 83 y.o. female who was admitted 07/25/2016 for operative treatment ofPrimary localized osteoarthritis of right knee. Patient has severe unremitting pain that affects sleep, daily activities, and work/hobbies. After pre-op clearance the patient was taken to the operating room on 07/25/2016 and underwent  Procedure(s): TOTAL KNEE ARTHROPLASTY.    Patient was given perioperative antibiotics:  Anti-infectives    Start     Dose/Rate Route Frequency Ordered Stop   07/25/16 1730  ceFAZolin (ANCEF) IVPB 2g/100 mL premix     2 g 200 mL/hr over 30 Minutes Intravenous Every 6 hours 07/25/16 1421 07/25/16 2351   07/25/16 1045  ceFAZolin (ANCEF) IVPB 2g/100 mL premix     2 g 200 mL/hr over 30 Minutes Intravenous On call to O.R. 07/24/16 1428 07/25/16 1125       Patient was given sequential compression devices, early ambulation, and chemoprophylaxis to prevent DVT.  Patient benefited maximally from hospital stay and there were no complications.    Recent vital signs:  Patient Vitals for the past 24 hrs:  BP Temp Temp src Pulse Resp SpO2  07/27/16 0443 (!) 132/45 97.8 F (36.6 C) Oral 65 18 96 %  07/26/16 2231 (!) 124/47 97.7 F (36.5 C) Oral  60 18 95 %  07/26/16 1700 (!) 127/51 98.2 F (36.8 C) Oral 65 18 98 %  07/26/16 0950 (!) 108/35 - - (!) 56 18 -     Recent laboratory studies:   Recent Labs  07/26/16 0534 07/27/16 0557  WBC 5.7 6.7  HGB 10.4* 10.2*  HCT 33.0* 32.0*  PLT 205 187  NA 137 141  K 4.6 5.0  CL 105 108  CO2 24 26  BUN 14 14  CREATININE 1.01* 0.90  GLUCOSE 112* 106*  CALCIUM 8.5* 8.9     Discharge Medications:   Allergies as of 07/27/2016      Reactions   No Known Allergies       Medication List    STOP taking these medications   acetaminophen 500 MG tablet Commonly known as:  TYLENOL   enoxaparin 40 MG/0.4ML injection Commonly known as:  LOVENOX     TAKE these medications   aspirin EC 325 MG tablet Take 1 tablet (325 mg total) by mouth daily. 1 tab a day for the next 30 days to prevent blood clots   carboxymethylcellulose 0.5 % Soln Commonly known as:  REFRESH PLUS Place 1-2 drops into both eyes 3 (three) times daily as needed (dry eye).   furosemide 20 MG tablet Commonly known as:  LASIX Take 20-40 mg by mouth daily as needed (for fluid retention).   HYDROcodone-acetaminophen 5-325 MG tablet  Commonly known as:  NORCO Take 1-2 tablets by mouth every 4 (four) hours as needed for moderate pain.   latanoprost 0.005 % ophthalmic solution Commonly known as:  XALATAN Place 1 drop into both eyes at bedtime.   levothyroxine 88 MCG tablet Commonly known as:  SYNTHROID, LEVOTHROID Take 88 mcg by mouth daily before breakfast.   metoprolol tartrate 50 MG tablet Commonly known as:  LOPRESSOR Take 25 mg by mouth 2 (two) times daily.   NON FORMULARY Take 3 capsules by mouth daily after breakfast. Optimal-V(for Heart,Eyes, Skin & Lungs) Dietary supplement   NON FORMULARY Take 2 capsules by mouth daily after breakfast. Optimal-M (for Bones, Nerves & Muscles) Dietary supplement   NON FORMULARY Take 2 capsules by mouth at bedtime. Magnical-D (for Bones & Muscles) Dietary  supplement   NON FORMULARY Take 2 capsules by mouth daily after breakfast. "Rejuveniix" energy and mental alertness   ondansetron 4 MG tablet Commonly known as:  ZOFRAN Take 1 tablet (4 mg total) by mouth every 8 (eight) hours as needed for nausea or vomiting.   ranitidine 300 MG tablet Commonly known as:  ZANTAC Take 300 mg by mouth 2 (two) times daily.   sertraline 100 MG tablet Commonly known as:  ZOLOFT Take 50-100 mg by mouth 2 (two) times daily. Take 0.5 tablet (50 MG) by mouth with supper & 1 tablet (100 MG) by mouth after breakfast       Diagnostic Studies: Dg Knee Right Port  Result Date: 07/25/2016 CLINICAL DATA:  83 year old female status post right knee replacement. EXAM: PORTABLE RIGHT KNEE - 1-2 VIEW COMPARISON:  None. FINDINGS: AP and cross-table lateral views. Sequelae of total right knee arthroplasty. Hardware appears intact and normally aligned. Soft tissue postoperative changes including subcutaneous gas, and air-fluid level within the right knee joint space. No unexpected osseous changes. IMPRESSION: Right total knee arthroplasty with no adverse features. Electronically Signed   By: Odessa Fleming M.D.   On: 07/25/2016 13:34    Disposition: 03-Skilled Nursing Facility    Follow-up Information    Loreta Ave, MD. Schedule an appointment as soon as possible for a visit in 2 weeks.   Specialty:  Orthopedic Surgery Contact information: [address] [place] [address] [phone]            Signed: Otilio Saber 07/27/2016, 8:37 AM

## 2016-07-25 NOTE — Progress Notes (Signed)
Dentures given to patient's daughter Jeronimo NormaJeanie.

## 2016-07-25 NOTE — Progress Notes (Signed)
Orthopedic Tech Progress Note Patient Details:  Glenard Haringancy L Calender [birth year] [id number]  CPM Right Knee CPM Right Knee: On Right Knee Flexion (Degrees): 90 Right Knee Extension (Degrees): 0 Additional Comments: trapeze bar patient helper   Nikki DomCrawford, Shanequa Whitenight 07/25/2016, 2:39 PM Viewed order from doctor's order list

## 2016-07-25 NOTE — Anesthesia Procedure Notes (Signed)
Anesthesia Regional Block: Adductor canal block   Pre-Anesthetic Checklist: ,, timeout performed, Correct Patient, Correct Site, Correct Laterality, Correct Procedure, Correct Position, site marked, Risks and benefits discussed,  Surgical consent,  Pre-op evaluation,  At surgeon's request and post-op pain management  Laterality: Right  Prep: Dura Prep       Needles:  Injection technique: Single-shot  Needle Type: Stimiplex     Needle Length: 9cm  Needle Gauge: 21     Additional Needles:   Procedures: ultrasound guided,,,,,,,,  Narrative:  Start time: 07/25/2016 10:07 AM End time: 07/25/2016 10:12 AM Injection made incrementally with aspirations every 5 mL.  Performed by: Personally  Anesthesiologist: Anitra LauthMILLER, Harvey Lingo RAY

## 2016-07-25 NOTE — Anesthesia Preprocedure Evaluation (Signed)
Anesthesia Evaluation  Patient identified by MRN, date of birth, ID band Patient awake    Reviewed: Allergy & Precautions, H&P , NPO status , Patient's Chart, lab work & pertinent test results, reviewed documented beta blocker date and time   Airway Mallampati: II   Neck ROM: full    Dental   Pulmonary neg pulmonary ROS,    breath sounds clear to auscultation       Cardiovascular hypertension, Pt. on medications and Pt. on home beta blockers  Rhythm:regular Rate:Normal     Neuro/Psych Anxiety Cerebral aneurysm    GI/Hepatic hiatal hernia, GERD  ,  Endo/Other  Hypothyroidism   Renal/GU Renal InsufficiencyRenal disease     Musculoskeletal  (+) Arthritis ,   Abdominal   Peds  Hematology   Anesthesia Other Findings   Reproductive/Obstetrics                             Anesthesia Physical  Anesthesia Plan  ASA: III  Anesthesia Plan: MAC and Spinal   Post-op Pain Management:  Regional for Post-op pain   Induction: Intravenous  PONV Risk Score and Plan: 2 and Ondansetron, Treatment may vary due to age or medical condition and Midazolam  Airway Management Planned: Simple Face Mask  Additional Equipment:   Intra-op Plan:   Post-operative Plan:   Informed Consent: I have reviewed the patients History and Physical, chart, labs and discussed the procedure including the risks, benefits and alternatives for the proposed anesthesia with the patient or authorized representative who has indicated his/her understanding and acceptance.     Plan Discussed with: CRNA, Anesthesiologist and Surgeon  Anesthesia Plan Comments:         Anesthesia Quick Evaluation

## 2016-07-25 NOTE — Evaluation (Signed)
Physical Therapy Evaluation Patient Details Name: [name] MRN: [id number] DOB: August 06, 1927 Today's Date: 07/25/2016   History of Present Illness  Pt is 83 y/o female s/p elective R TKA secondary to R knee OA. PMH includes aneurysm (non-repairable), CKD, HTN, bilat neuropathy, L ankle fracture surgery, L shoulder surgery, and bilat THA.   Clinical Impression  Pt s/p elective surgery above with deficits below. PTA, pt was using RW and still having PT for previous L THA. Upon evaluation, pt very limited by post op pain, also exhibiting BLE weakness and decreased balance. Pt requiring min to mod A for functional activity this session. Limited in RLE exercise secondary to pain as well. Plan is to go home with daughter at d/c. Follow up recommendations below. Will continue to follow acutely to maximize functional mobility independence and safety.     Follow Up Recommendations DC plan and follow up therapy as arranged by surgeon;Supervision/Assistance - 24 hour    Equipment Recommendations  None recommended by PT    Recommendations for Other Services       Precautions / Restrictions Precautions Precautions: Fall;Knee Precaution Booklet Issued: Yes (comment) Precaution Comments: Reviewed supine ther ex with pt. Limited in tolerance secondary to pain.  Restrictions Weight Bearing Restrictions: Yes RLE Weight Bearing: Weight bearing as tolerated      Mobility  Bed Mobility Overal bed mobility: Needs Assistance Bed Mobility: Supine to Sit     Supine to sit: Mod assist     General bed mobility comments: Mod A for RLE management and scooting hips using bed pad. Use of elevated HOB and bed rails.   Transfers Overall transfer level: Needs assistance Equipment used: Rolling walker (2 wheeled) Transfers: Sit to/from Stand Sit to Stand: Min assist         General transfer comment: Min A for lift assist, and VCs for safe hand placement. Required cues to tuck bottom in to obtain  upright standing.   Ambulation/Gait Ambulation/Gait assistance: Min assist Ambulation Distance (Feet): 5 Feet Assistive device: Rolling walker (2 wheeled) Gait Pattern/deviations: Step-to pattern;Decreased step length - right;Decreased weight shift to right;Antalgic;Trunk flexed Gait velocity: Decreased Gait velocity interpretation: Below normal speed for age/gender General Gait Details: Slow, antalgic, unsteady gait. Very limited by pain. Verbal cues for upright posture. Toe out noticed on LLE as compensation. Family reports they have been working on decreasing toe out pattern in PT after hip surgery.   Stairs            Wheelchair Mobility    Modified Rankin (Stroke Patients Only)       Balance Overall balance assessment: Needs assistance Sitting-balance support: No upper extremity supported;Feet supported Sitting balance-Leahy Scale: Good     Standing balance support: Bilateral upper extremity supported;During functional activity Standing balance-Leahy Scale: Poor Standing balance comment: Reliant on RW for stability                              Pertinent Vitals/Pain Pain Assessment: 0-10 Pain Score: 9  Pain Location: R knee  Pain Descriptors / Indicators: Aching;Operative site guarding;Sore;Grimacing Pain Intervention(s): Limited activity within patient's tolerance;Monitored during session;Repositioned    Home Living Family/patient expects to be discharged to:: Private residence Living Arrangements: Alone (going home with daughter ) Available Help at Discharge: Family;Available 24 hours/day Type of Home: House Home Access: Other (comment) (threshold step )     Home Layout: One level Home Equipment: Emergency planning/management officerhower seat;Walker - 2 wheels;Cane -  single point;Bedside commode;Toilet riser      Prior Function Level of Independence: Independent with assistive device(s)         Comments: USed RW at baseline      Hand Dominance   Dominant Hand:  Right    Extremity/Trunk Assessment   Upper Extremity Assessment Upper Extremity Assessment: Defer to OT evaluation    Lower Extremity Assessment Lower Extremity Assessment: LLE deficits/detail;RLE deficits/detail RLE Deficits / Details: Neuropathy at baseline. Deficits consistent with post op pain and weakness. Able to perform exercises below, but limited by pain.  LLE Deficits / Details: Neuropathy at baseline     Cervical / Trunk Assessment Cervical / Trunk Assessment: Kyphotic  Communication   Communication: No difficulties  Cognition Arousal/Alertness: Awake/alert Behavior During Therapy: WFL for tasks assessed/performed Overall Cognitive Status: Within Functional Limits for tasks assessed                                        General Comments General comments (skin integrity, edema, etc.): Family present throughout session. Pt very limited by pain this session.     Exercises Total Joint Exercises Ankle Circles/Pumps: AROM;Both;10 reps;Supine Quad Sets: AROM;Right;10 reps;Supine Towel Squeeze: AROM;Both;10 reps;Supine Hip ABduction/ADduction: AROM;Right;10 reps;Supine   Assessment/Plan    PT Assessment Patient needs continued PT services  PT Problem List Decreased strength;Decreased range of motion;Decreased activity tolerance;Decreased balance;Decreased mobility;Decreased coordination;Decreased knowledge of use of DME;Decreased knowledge of precautions;Pain       PT Treatment Interventions DME instruction;Gait training;Functional mobility training;Stair training;Therapeutic activities;Therapeutic exercise;Balance training;Neuromuscular re-education;Patient/family education    PT Goals (Current goals can be found in the Care Plan section)  Acute Rehab PT Goals Patient Stated Goal: to decrease pain  PT Goal Formulation: With patient Time For Goal Achievement: 08/01/16 Potential to Achieve Goals: Good    Frequency 7X/week   Barriers to  discharge        Co-evaluation               AM-PAC PT "6 Clicks" Daily Activity  Outcome Measure Difficulty turning over in bed (including adjusting bedclothes, sheets and blankets)?: Total Difficulty moving from lying on back to sitting on the side of the bed? : Total Difficulty sitting down on and standing up from a chair with arms (e.g., wheelchair, bedside commode, etc,.)?: Total Help needed moving to and from a bed to chair (including a wheelchair)?: A Little Help needed walking in hospital room?: A Little Help needed climbing 3-5 steps with a railing? : A Lot 6 Click Score: 11    End of Session Equipment Utilized During Treatment: Gait belt;Right knee immobilizer Activity Tolerance: Patient limited by pain Patient left: in chair;with call bell/phone within reach;with family/visitor present Nurse Communication: Mobility status PT Visit Diagnosis: Other abnormalities of gait and mobility (R26.89);Pain Pain - Right/Left: Right Pain - part of body: Knee    Time: 1610-9604 PT Time Calculation (min) (ACUTE ONLY): 27 min   Charges:   PT Evaluation $PT Eval Low Complexity: 1 Procedure PT Treatments $Gait Training: 8-22 mins   PT G Codes:        Gladys Damme, PT, DPT  Acute Rehabilitation Services  Pager: [phone]   Lehman Prom 07/25/2016, 6:49 PM

## 2016-07-25 NOTE — Anesthesia Postprocedure Evaluation (Signed)
Anesthesia Post Note  Patient: [name]  Procedure(s) Performed: Procedure(s) (LRB): TOTAL KNEE ARTHROPLASTY (Right)     Patient location during evaluation: PACU Anesthesia Type: MAC Level of consciousness: oriented and awake and alert Pain management: pain level controlled Vital Signs Assessment: post-procedure vital signs reviewed and stable Respiratory status: spontaneous breathing and respiratory function stable Cardiovascular status: blood pressure returned to baseline and stable Postop Assessment: no headache and no backache Anesthetic complications: no    Last Vitals:  Vitals:   07/25/16 1400 07/25/16 1415  BP: (!) 122/53 (!) 115/48  Pulse: 62 (!) 53  Resp: 20 11  Temp:      Last Pain:  Vitals:   07/25/16 1300  TempSrc:   PainSc: 0-No pain                 Lynda Rainwater

## 2016-07-25 NOTE — Anesthesia Procedure Notes (Addendum)
Spinal  Patient location during procedure: OR Start time: 07/25/2016 11:13 AM End time: 07/25/2016 11:18 AM Staffing Anesthesiologist: Anitra LauthMILLER, Amilya Haver RAY Performed: anesthesiologist  Preanesthetic Checklist Completed: patient identified, site marked, surgical consent, pre-op evaluation, timeout performed, IV checked, risks and benefits discussed and monitors and equipment checked Spinal Block Patient position: sitting Prep: DuraPrep Patient monitoring: heart rate, cardiac monitor, continuous pulse ox and blood pressure Approach: midline Location: L3-4 Injection technique: single-shot Needle Needle type: Quincke  Needle gauge: 22 G Needle length: 9 cm Assessment Sensory level: T8

## 2016-07-25 NOTE — Discharge Instructions (Signed)

## 2016-07-25 NOTE — Transfer of Care (Signed)
Immediate Anesthesia Transfer of Care Note  Patient: [name]  Procedure(s) Performed: Procedure(s): TOTAL KNEE ARTHROPLASTY (Right)  Patient Location: PACU  Anesthesia Type:Spinal  Level of Consciousness: drowsy and patient cooperative  Airway & Oxygen Therapy: Patient Spontanous Breathing and Patient connected to face mask oxygen  Post-op Assessment: Report given to RN and Post -op Vital signs reviewed and stable  Post vital signs: Reviewed and stable  Last Vitals:  Vitals:   07/25/16 0926 07/25/16 1302  BP: (!) 143/42   Pulse: (!) 53   Resp: 16   Temp: 36.5 C 36.1 C    Last Pain:  Vitals:   07/25/16 0926  TempSrc: Oral      Patients Stated Pain Goal: 3 (07/25/16 0926)  Complications: No apparent anesthesia complications

## 2016-07-26 ENCOUNTER — Encounter (HOSPITAL_COMMUNITY): Payer: Self-pay | Admitting: Orthopedic Surgery

## 2016-07-26 LAB — BASIC METABOLIC PANEL
Anion gap: 8 (ref 5–15)
BUN: 14 mg/dL (ref 6–20)
CALCIUM: 8.5 mg/dL — AB (ref 8.9–10.3)
CO2: 24 mmol/L (ref 22–32)
Chloride: 105 mmol/L (ref 101–111)
Creatinine, Ser: 1.01 mg/dL — ABNORMAL HIGH (ref 0.44–1.00)
GFR calc non Af Amer: 49 mL/min — ABNORMAL LOW (ref >60)
GFR, EST AFRICAN AMERICAN: 56 mL/min — AB (ref >60)
Glucose, Bld: 112 mg/dL — ABNORMAL HIGH (ref 65–99)
Potassium: 4.6 mmol/L (ref 3.5–5.1)
SODIUM: 137 mmol/L (ref 135–145)

## 2016-07-26 LAB — CBC
HCT: 33 % — ABNORMAL LOW (ref 36.0–46.0)
Hemoglobin: 10.4 g/dL — ABNORMAL LOW (ref 12.0–15.0)
MCH: 28.5 pg (ref 26.0–34.0)
MCHC: 31.5 g/dL (ref 30.0–36.0)
MCV: 90.4 fL (ref 78.0–100.0)
Platelets: 205 10*3/uL (ref 150–400)
RBC: 3.65 MIL/uL — ABNORMAL LOW (ref 3.87–5.11)
RDW: 14.7 % (ref 11.5–15.5)
WBC: 5.7 10*3/uL (ref 4.0–10.5)

## 2016-07-26 MED ORDER — OXYCODONE HCL 5 MG PO TABS
5.0000 mg | ORAL_TABLET | ORAL | Status: DC | PRN
  Administered 2016-07-26 – 2016-07-27 (×7): 10 mg via ORAL
  Filled 2016-07-26 (×7): qty 2

## 2016-07-26 NOTE — Progress Notes (Signed)
Orthopedic Tech Progress Note Patient Details:  [name] 07/05/1927 [id number]  Patient ID: [name], female   DOB: 07/05/1927, 83 y.o.   MRN: [id number] Pt will call when ready for cpm  Trinna PostMartinez, Daryan Buell J 07/26/2016, 5:49 AM

## 2016-07-26 NOTE — Progress Notes (Addendum)
Subjective: 1 Day Post-Op Procedure(s) (LRB): TOTAL KNEE ARTHROPLASTY (Right) Patient reports pain as severe.  Main complaint is pain control.  Discussed post-op pain management with patient and family in the pre-op period.  Decided to only use norco due to patient's AMS following THR a few months back.  This is obviously not controlling pain at this time.  Otherwise, no nausea/vomiting, chest pain/sob, lightheadedness/dizziness.    Objective: Vital signs in last 24 hours: Temp:  [97 F (36.1 C)-99 F (37.2 C)] 99 F (37.2 C) (06/21 0653) Pulse Rate:  [49-67] 67 (06/21 0653) Resp:  [11-20] 18 (06/21 0653) BP: (90-162)/(42-54) 123/47 (06/21 0653) SpO2:  [95 %-100 %] 95 % (06/21 0653) Weight:  [81.6 kg (180 lb)] 81.6 kg (180 lb) (06/20 0926)  Intake/Output from previous day: 06/20 0701 - 06/21 0700 In: 2293.3 [I.V.:2293.3] Out: 370 [Urine:350; Blood:20] Intake/Output this shift: No intake/output data recorded.  No results for input(s): HGB in the last 72 hours. No results for input(s): WBC, RBC, HCT, PLT in the last 72 hours. No results for input(s): NA, K, CL, CO2, BUN, CREATININE, GLUCOSE, CALCIUM in the last 72 hours. No results for input(s): LABPT, INR in the last 72 hours.  Neurologically intact Neurovascular intact Sensation intact distally Intact pulses distally Dorsiflexion/Plantar flexion intact Incision: dressing C/D/I No cellulitis present Compartment soft  Assessment/Plan: 1 Day Post-Op Procedure(s) (LRB): TOTAL KNEE ARTHROPLASTY (Right) Advance diet Up with therapy D/C IV fluids Plan for discharge tomorrow home with hhpt WBAT RLE Will d/c norco and add oxy IR due to inadequate pain control Please remove ace bandage and apply ted hose  Otilio SaberM Lindsey Stanbery 07/26/2016, 7:09 AM

## 2016-07-26 NOTE — Progress Notes (Signed)
Physical Therapy Treatment Patient Details Name: [name] MRN: [id number] DOB: Sep 24, 1927 Today's Date: 07/26/2016    History of Present Illness Pt is 83 y/o female s/p elective R TKA secondary to R knee OA. PMH includes aneurysm (non-repairable), CKD, HTN, bilat neuropathy, L ankle fracture surgery, L shoulder surgery, and bilat THA.     PT Comments    Pt progressing well.  Plan for home remains appropriate. Will try stairs in am in prep for d/c home.     Follow Up Recommendations  DC plan and follow up therapy as arranged by surgeon;Supervision/Assistance - 24 hour     Equipment Recommendations  None recommended by PT    Recommendations for Other Services       Precautions / Restrictions Precautions Precautions: Fall;Knee Precaution Booklet Issued: Yes (comment) Precaution Comments: reviewed no pillow under knee with patient and her daughter Restrictions Weight Bearing Restrictions: Yes RLE Weight Bearing: Weight bearing as tolerated    Mobility  Bed Mobility Overal bed mobility: Needs Assistance Bed Mobility: Supine to Sit     Supine to sit: Min assist     General bed mobility comments: Cues for hand placement and technique to advance to edge of bed.  MIn assist to R knee.    Transfers Overall transfer level: Needs assistance Equipment used: Rolling walker (2 wheeled) Transfers: Sit to/from Stand Sit to Stand: Min guard         General transfer comment: Cues for hand placement to and from seated surface.  Min guard for safety.    Ambulation/Gait Ambulation/Gait assistance: Min guard Ambulation Distance (Feet): 130 Feet Assistive device: Rolling walker (2 wheeled) Gait Pattern/deviations: Decreased step length - right;Decreased weight shift to right;Antalgic;Trunk flexed;Decreased stride length;Step-through pattern Gait velocity: Decreased   General Gait Details: Pt remains slow and guarded due to pain. Cues for hip and trunk extension.  Pt presents  with B feet ER.  Cues for neutral alignment of hips to avoid ER.    Stairs            Wheelchair Mobility    Modified Rankin (Stroke Patients Only)       Balance Overall balance assessment: Needs assistance Sitting-balance support: No upper extremity supported;Feet supported Sitting balance-Leahy Scale: Good     Standing balance support: Bilateral upper extremity supported;During functional activity Standing balance-Leahy Scale: Poor Standing balance comment: Reliant on RW for stability                             Cognition Arousal/Alertness: Awake/alert Behavior During Therapy: WFL for tasks assessed/performed Overall Cognitive Status: Within Functional Limits for tasks assessed                                        Exercises Total Joint Exercises Ankle Circles/Pumps: AROM;Both;10 reps;Supine Quad Sets: AROM;Right;10 reps;Supine Towel Squeeze: AROM;Both;10 reps;Supine Short Arc Quad: Right;10 reps;Supine;AAROM Heel Slides: Right;10 reps;Supine;AAROM Hip ABduction/ADduction: Right;10 reps;Supine;AAROM Straight Leg Raises: Right;10 reps;Supine;AAROM Long Arc Quad: AAROM;Right;10 reps;Seated Goniometric ROM: 68 degrees flexion.  R knee.      General Comments        Pertinent Vitals/Pain Pain Assessment: 0-10 Pain Score: 6  Pain Location: R knee  Pain Descriptors / Indicators: Aching;Operative site guarding;Sore;Grimacing Pain Intervention(s): Monitored during session;Repositioned;Ice applied    Home Living Family/patient expects to be discharged to:: Private residence Living Arrangements:  Alone Available Help at Discharge: Family;Available 24 hours/day Type of Home: House Home Access: Other (comment);Stairs to enter (1)   Home Layout: One level Home Equipment: Emergency planning/management officerhower seat;Walker - 2 wheels;Cane - single point;Bedside commode;Toilet riser      Prior Function Level of Independence: Independent with assistive device(s)       Comments: USed RW at baseline    PT Goals (current goals can now be found in the care plan section) Acute Rehab PT Goals Patient Stated Goal: to decrease pain  Potential to Achieve Goals: Good Progress towards PT goals: Progressing toward goals    Frequency    7X/week      PT Plan Current plan remains appropriate    Co-evaluation              AM-PAC PT "6 Clicks" Daily Activity  Outcome Measure  Difficulty turning over in bed (including adjusting bedclothes, sheets and blankets)?: Total Difficulty moving from lying on back to sitting on the side of the bed? : Total Difficulty sitting down on and standing up from a chair with arms (e.g., wheelchair, bedside commode, etc,.)?: A Lot Help needed moving to and from a bed to chair (including a wheelchair)?: A Little Help needed walking in hospital room?: A Little Help needed climbing 3-5 steps with a railing? : A Lot 6 Click Score: 12    End of Session Equipment Utilized During Treatment: Gait belt;Right knee immobilizer Activity Tolerance: Patient limited by pain Patient left: in chair;with call bell/phone within reach;with family/visitor present Nurse Communication: Mobility status PT Visit Diagnosis: Other abnormalities of gait and mobility (R26.89);Pain Pain - Right/Left: Right Pain - part of body: Knee     Time: [id number] PT Time Calculation (min) (ACUTE ONLY): 26 min  Charges:  $Gait Training: 8-22 mins $Therapeutic Exercise: 8-22 mins                    G Codes:       Joycelyn RuaAimee Pierce Biagini, PTA pager [phone]    Florestine Aversimee J Osmara Drummonds 07/26/2016, 4:17 PM

## 2016-07-26 NOTE — Care Management Note (Signed)
Case Management Note  Patient Details  Name: Glenard Haringancy L Placke MRN: [id number] Date of Birth: 1928-02-02  Subjective/Objective:                    Action/Plan: [facility] CM informed Corrie DandyMary with [facility] of probable d/c tomorrow. No DME needs. CM following.   Expected Discharge Date:  07/27/16               Expected Discharge Plan:  Home w Home Health Services  In-House Referral:     Discharge planning Services  CM Consult  Post Acute Care Choice:  Home Health Choice offered to:  Patient  DME Arranged:    DME Agency:     HH Arranged:  PT [facility]  Status of Service:  In process, will continue to follow  If discussed at Long Length of Stay Meetings, dates discussed:    Additional Comments:  Kermit BaloKelli F Milfred Krammes, RN 07/26/2016, 2:50 PM

## 2016-07-26 NOTE — Op Note (Signed)
Connie Calamity:  [name], [name]                ACCOUNT NO.:  [id number]  MEDICAL RECORD NO.:  [id number]  LOCATION:                                 FACILITY:  PHYSICIAN:  Loreta Aveaniel F. Zeki Bedrosian, M.D.      DATE OF BIRTH:  DATE OF PROCEDURE:  07/25/2016 DATE OF DISCHARGE:                              OPERATIVE REPORT   PREOPERATIVE DIAGNOSES:  Right knee end-stage arthritis, primary localized.  Valgus alignment.  POSTOPERATIVE DIAGNOSES:  Right knee end-stage arthritis, primary localized.  Valgus alignment.  Extreme osteopenia throughout.  PROCEDURE:  Right knee modified minimally invasive total knee replacement, Stryker triathlon prosthesis.  A cemented pegged cruciate retaining #5 femoral component.  Cemented #5 tibial component, 9-mm CS insert.  Cemented resurfacing 35-mm patellar component.  SURGEON:  Loreta Aveaniel F. Ronnica Dreese, M.D.  ASSISTANT:  Mikey KirschnerLindsey Stanberry, PA., present throughout the entire case and necessary for timely completion of procedure.  ANESTHESIA:  Spinal.  BLOOD LOSS:  Minimal.  SPECIMENS:  None.  CULTURES:  None.  COMPLICATIONS:  None.  DRESSINGS:  Soft compressive knee immobilizer.  TOURNIQUET TIME:  1 hour 15 minutes.  DESCRIPTION OF PROCEDURE:  The patient was brought to THE operating room, and after adequate anesthesia had been obtained, tourniquet applied.  Prepped and draped in usual sterile fashion.  Exsanguinated with elevation of Esmarch.  Tourniquet inflated to 350 mmHg.  Valgus almost 18 degrees, partially correctable.  Anterior approach.  Incision above the patella down to tibial tubercle.  Medial arthrotomy, vastus splitting.  Knee exposed.  Flexible intramedullary guide distal femur. 8 mm resection, 5 degrees of valgus.  Using epicondylar axis, the femur was sized, cut, and fitted for a cruciate retaining pegged #5 component. Extremely soft bone throughout.  Proximal tibial resection with extramedullary guide.  Also size #5 component.  Rotation set  with trials.  Tibia was hand reamed.  Patella exposed.  Posterior 10 mm removed.  Drilled, sized, and fitted for a 35-mm component.  After appropriate trials, the knee was thoroughly irrigated.  Cement prepared, placed on all components, firmly seated.  Polyethylene attached to tibia, knee reduced.  Patella with a clamp.  Once the cement hardened, the knee was irrigated again.  Soft tissue was injected with Exparel.  Arthrotomy closed with #1 Vicryl with subcutaneous and subcuticular closure.  Sterile compressive dressing applied.  Tourniquet deflated and removed.  Knee immobilizer applied. Anesthesia reversed.  Brought to the recovery room.  Tolerated the surgery well.  No complications.     Loreta Aveaniel F. Keedan Sample, M.D.   ______________________________ Loreta Aveaniel F. Shakir Petrosino, M.D.    DFM/MEDQ  D:  07/25/2016  T:  07/25/2016  Job:  [id number]

## 2016-07-26 NOTE — Progress Notes (Signed)
Physical Therapy Treatment Patient Details Name: [name] MRN: [id number] DOB: October 25, 1927 Today's Date: 07/26/2016    History of Present Illness Pt is 83 y/o female s/p elective R TKA secondary to R knee OA. PMH includes aneurysm (non-repairable), CKD, HTN, bilat neuropathy, L ankle fracture surgery, L shoulder surgery, and bilat THA.     PT Comments    Pt performed increased gait and required max VCs for encouragement.  Pt in extreme pain and placed back in bed in CPM post tx.  Will f/u in pm to progress mobility.     Follow Up Recommendations  DC plan and follow up therapy as arranged by surgeon;Supervision/Assistance - 24 hour     Equipment Recommendations  None recommended by PT    Recommendations for Other Services       Precautions / Restrictions Precautions Precautions: Fall;Knee Precaution Booklet Issued: Yes (comment) Precaution Comments: reviewed no pillow under knee with patient and her daughter Restrictions Weight Bearing Restrictions: Yes RLE Weight Bearing: Weight bearing as tolerated    Mobility  Bed Mobility Overal bed mobility: Needs Assistance Bed Mobility: Supine to Sit     Supine to sit: Mod assist     General bed mobility comments: pt up in recliner upon arrival  Transfers Overall transfer level: Needs assistance Equipment used: Rolling walker (2 wheeled) Transfers: Sit to/from Stand Sit to Stand: Min guard         General transfer comment: Pt required cues for hand placement to and from seated surface.    Ambulation/Gait Ambulation/Gait assistance: Min assist Ambulation Distance (Feet): 85 Feet Assistive device: Rolling walker (2 wheeled) Gait Pattern/deviations: Step-to pattern;Decreased step length - right;Decreased weight shift to right;Antalgic;Trunk flexed;Decreased stride length Gait velocity: Decreased   General Gait Details: Pt remains slow and guarded due to pain. Cues for hip and trunk extension.  Pt presents with B  feet ER.     Stairs            Wheelchair Mobility    Modified Rankin (Stroke Patients Only)       Balance Overall balance assessment: Needs assistance Sitting-balance support: No upper extremity supported;Feet supported Sitting balance-Leahy Scale: Good     Standing balance support: Bilateral upper extremity supported;During functional activity Standing balance-Leahy Scale: Poor Standing balance comment: Reliant on RW for stability                             Cognition Arousal/Alertness: Awake/alert Behavior During Therapy: WFL for tasks assessed/performed Overall Cognitive Status: Within Functional Limits for tasks assessed                                        Exercises Total Joint Exercises Ankle Circles/Pumps: AROM;Both;10 reps;Supine Quad Sets: AROM;Right;10 reps;Supine Towel Squeeze: AROM;Both;10 reps;Supine Short Arc Quad: Right;10 reps;Supine;AAROM Heel Slides: Right;10 reps;Supine;AAROM Hip ABduction/ADduction: Right;10 reps;Supine;AAROM Straight Leg Raises: Right;10 reps;Supine;AAROM Long Arc Quad: AAROM;Right;10 reps;Seated Goniometric ROM: 68 degrees flexion.  R knee.      General Comments        Pertinent Vitals/Pain Pain Assessment: 0-10 Pain Score: 8  Pain Location: R knee  Pain Descriptors / Indicators: Aching;Operative site guarding;Sore;Grimacing Pain Intervention(s): Monitored during session;Repositioned    Home Living Family/patient expects to be discharged to:: Private residence Living Arrangements: Alone Available Help at Discharge: Family;Available 24 hours/day Type of Home: House Home Access:  Other (comment);Stairs to enter (1)   Home Layout: One level Home Equipment: Emergency planning/management officerhower seat;Walker - 2 wheels;Cane - single point;Bedside commode;Toilet riser      Prior Function Level of Independence: Independent with assistive device(s)      Comments: USed RW at baseline    PT Goals (current goals can  now be found in the care plan section) Acute Rehab PT Goals Patient Stated Goal: to decrease pain  Potential to Achieve Goals: Good Progress towards PT goals: Progressing toward goals    Frequency    7X/week      PT Plan      Co-evaluation              AM-PAC PT "6 Clicks" Daily Activity  Outcome Measure  Difficulty turning over in bed (including adjusting bedclothes, sheets and blankets)?: Total Difficulty moving from lying on back to sitting on the side of the bed? : Total Difficulty sitting down on and standing up from a chair with arms (e.g., wheelchair, bedside commode, etc,.)?: A Lot Help needed moving to and from a bed to chair (including a wheelchair)?: A Little Help needed walking in hospital room?: A Little Help needed climbing 3-5 steps with a railing? : A Lot 6 Click Score: 12    End of Session Equipment Utilized During Treatment: Gait belt;Right knee immobilizer Activity Tolerance: Patient limited by pain Patient left: in chair;with call bell/phone within reach;with family/visitor present Nurse Communication: Mobility status PT Visit Diagnosis: Other abnormalities of gait and mobility (R26.89);Pain Pain - Right/Left: Right Pain - part of body: Knee     Time: 1102-1130 PT Time Calculation (min) (ACUTE ONLY): 28 min  Charges:  $Gait Training: 8-22 mins $Therapeutic Exercise: 8-22 mins                    G Codes:       Joycelyn RuaAimee Ardian Haberland, PTA pager [phone]    Florestine Aversimee J Wavie Hashimi 07/26/2016, 2:58 PM

## 2016-07-26 NOTE — Evaluation (Signed)
Occupational Therapy Evaluation Patient Details Name: [name] MRN: [id number] DOB: 1927/10/23 Today's Date: 07/26/2016    History of Present Illness Pt is 83 y/o female s/p elective R TKA secondary to R knee OA. PMH includes aneurysm (non-repairable), CKD, HTN, bilat neuropathy, L ankle fracture surgery, L shoulder surgery, and bilat THA.    Clinical Impression   Pt with decline in function and safety with ADLs and ADL mobility with decreased balance and endurance. Pt would benefit from acute OT services to address impairments to increase level of function and safety    Follow Up Recommendations  Home health OT    Equipment Recommendations  None recommended by OT    Recommendations for Other Services       Precautions / Restrictions Precautions Precautions: Fall;Knee Precaution Comments: reviewed no pillow under knee with patient and her daughter Restrictions Weight Bearing Restrictions: Yes RLE Weight Bearing: Weight bearing as tolerated      Mobility Bed Mobility               General bed mobility comments: pt up in recliner upon arrival  Transfers Overall transfer level: Needs assistance Equipment used: Rolling walker (2 wheeled) Transfers: Sit to/from Stand Sit to Stand: Min assist         General transfer comment: Min A for lift assist, and VCs for safe hand placement. Required cues to tuck bottom in to obtain upright standing.     Balance   Sitting-balance support: No upper extremity supported;Feet supported Sitting balance-Leahy Scale: Good     Standing balance support: Bilateral upper extremity supported;During functional activity Standing balance-Leahy Scale: Poor                             ADL either performed or assessed with clinical judgement   ADL Overall ADL's : Needs assistance/impaired     Grooming: Wash/dry hands;Wash/dry face;Standing;Min guard   Upper Body Bathing: Supervision/ safety;Set up;Sitting   Lower  Body Bathing: Moderate assistance;With caregiver independent assisting   Upper Body Dressing : Supervision/safety;Set up;Sitting   Lower Body Dressing: Moderate assistance;With caregiver independent assisting   Toilet Transfer: Minimal assistance;BSC;Ambulation;RW;With caregiver independent Licensed conveyancerassisting Toilet Transfer Details (indicate cue type and reason): Min A for lift assist, and VCs for safe hand placement. Required cues to tuck bottom in to obtain upright standing.  Toileting- Clothing Manipulation and Hygiene: Minimal assistance;Sit to/from stand;With caregiver independent assisting       Functional mobility during ADLs: Minimal assistance;Cueing for safety       Vision Baseline Vision/History: Wears glasses Wears Glasses: Reading only Patient Visual Report: No change from baseline                  Pertinent Vitals/Pain Pain Assessment: 0-10 Pain Score: 5  Pain Location: R knee  Pain Descriptors / Indicators: Aching;Operative site guarding;Sore;Grimacing Pain Intervention(s): Limited activity within patient's tolerance;Monitored during session;Premedicated before session;Repositioned     Hand Dominance Right   Extremity/Trunk Assessment Upper Extremity Assessment Upper Extremity Assessment: Overall WFL for tasks assessed   Lower Extremity Assessment Lower Extremity Assessment: Defer to PT evaluation   Cervical / Trunk Assessment Cervical / Trunk Assessment: Kyphotic   Communication Communication Communication: No difficulties   Cognition Arousal/Alertness: Awake/alert Behavior During Therapy: WFL for tasks assessed/performed Overall Cognitive Status: Within Functional Limits for tasks assessed  General Comments   pt very pleasant and cooperative, daughters very supportive               Home Living Family/patient expects to be discharged to:: Private residence Living Arrangements: Alone Available  Help at Discharge: Family;Available 24 hours/day Type of Home: House Home Access: Other (comment);Stairs to enter (1)     Home Layout: One level     Bathroom Shower/Tub: Producer, television/film/video: Standard     Home Equipment: Emergency planning/management officer - 2 wheels;Cane - single point;Bedside commode;Toilet riser          Prior Functioning/Environment Level of Independence: Independent with assistive device(s)        Comments: USed RW at baseline         OT Problem List: Pain;Impaired balance (sitting and/or standing);Decreased activity tolerance;Decreased knowledge of use of DME or AE      OT Treatment/Interventions: Self-care/ADL training;DME and/or AE instruction;Therapeutic activities;Patient/family education;Therapeutic exercise    OT Goals(Current goals can be found in the care plan section) Acute Rehab OT Goals Patient Stated Goal: to decrease pain  OT Goal Formulation: With patient/family Time For Goal Achievement: 08/02/16 Potential to Achieve Goals: Good ADL Goals Pt Will Perform Grooming: with min guard assist;with supervision;standing;with caregiver independent in assisting Pt Will Perform Lower Body Bathing: with min assist;sitting/lateral leans;sit to/from stand;with caregiver independent in assisting Pt Will Perform Lower Body Dressing: with min assist;with caregiver independent in assisting;sitting/lateral leans;sit to/from stand Pt Will Transfer to Toilet: with min guard assist;bedside commode;regular height toilet;grab bars;ambulating Pt Will Perform Toileting - Clothing Manipulation and hygiene: with min guard assist;sit to/from stand;with caregiver independent in assisting Pt Will Perform Tub/Shower Transfer: with min assist;with min guard assist;ambulating;shower seat;3 in 1;rolling walker;with caregiver independent in assisting  OT Frequency: Min 2X/week   Barriers to D/C:    no  barriers, will d/c home with one of her daughters                      AM-PAC PT "6 Clicks" Daily Activity     Outcome Measure Help from another person eating meals?: None Help from another person taking care of personal grooming?: A Little Help from another person toileting, which includes using toliet, bedpan, or urinal?: A Little Help from another person bathing (including washing, rinsing, drying)?: A Little Help from another person to put on and taking off regular upper body clothing?: None Help from another person to put on and taking off regular lower body clothing?: A Lot 6 Click Score: 19   End of Session Equipment Utilized During Treatment: Gait belt;Rolling walker;Other (comment) (BSC) CPM Right Knee CPM Right Knee: Off Right Knee Flexion (Degrees): 80 Right Knee Extension (Degrees): 0  Activity Tolerance: Patient tolerated treatment well Patient left: in chair;with call bell/phone within reach;with family/visitor present  OT Visit Diagnosis: Other abnormalities of gait and mobility (R26.89);Pain;Muscle weakness (generalized) (M62.81) Pain - Right/Left: Right Pain - part of body: Knee;Leg                Time: 1610-9604 OT Time Calculation (min): 43 min Charges:  OT General Charges $OT Visit: 1 Procedure OT Evaluation $OT Eval Low Complexity: 1 Procedure OT Treatments $Self Care/Home Management : 8-22 mins $Therapeutic Activity: 8-22 mins G-Codes: OT G-codes **NOT FOR INPATIENT CLASS** Functional Assessment Tool Used: AM-PAC 6 Clicks Daily Activity     Galen Manila 07/26/2016, 1:13 PM

## 2016-07-27 ENCOUNTER — Encounter (HOSPITAL_COMMUNITY): Payer: Self-pay | Admitting: Orthopedic Surgery

## 2016-07-27 LAB — CBC
HCT: 32 % — ABNORMAL LOW (ref 36.0–46.0)
Hemoglobin: 10.2 g/dL — ABNORMAL LOW (ref 12.0–15.0)
MCH: 29.8 pg (ref 26.0–34.0)
MCHC: 31.9 g/dL (ref 30.0–36.0)
MCV: 93.6 fL (ref 78.0–100.0)
PLATELETS: 187 10*3/uL (ref 150–400)
RBC: 3.42 MIL/uL — AB (ref 3.87–5.11)
RDW: 15.2 % (ref 11.5–15.5)
WBC: 6.7 10*3/uL (ref 4.0–10.5)

## 2016-07-27 LAB — BASIC METABOLIC PANEL
ANION GAP: 7 (ref 5–15)
BUN: 14 mg/dL (ref 6–20)
CO2: 26 mmol/L (ref 22–32)
Calcium: 8.9 mg/dL (ref 8.9–10.3)
Chloride: 108 mmol/L (ref 101–111)
Creatinine, Ser: 0.9 mg/dL (ref 0.44–1.00)
GFR, EST NON AFRICAN AMERICAN: 56 mL/min — AB (ref >60)
Glucose, Bld: 106 mg/dL — ABNORMAL HIGH (ref 65–99)
POTASSIUM: 5 mmol/L (ref 3.5–5.1)
SODIUM: 141 mmol/L (ref 135–145)

## 2016-07-27 NOTE — Progress Notes (Signed)
Physical Therapy Treatment Patient Details Name: [name] MRN: [id number] DOB: Apr 12, 1927 Today's Date: 07/27/2016    History of Present Illness Pt is 83 y/o female s/p elective R TKA secondary to R knee OA. PMH includes aneurysm (non-repairable), CKD, HTN, bilat neuropathy, L ankle fracture surgery, L shoulder surgery, and bilat THA.     PT Comments    Pt performed increased gait and stair training in prep for d/c home today.  Pt continues to progress well and reviewed HEP for clarity.  Pt to d/c home and RN informed.     Follow Up Recommendations  DC plan and follow up therapy as arranged by surgeon;Supervision/Assistance - 24 hour     Equipment Recommendations  None recommended by PT    Recommendations for Other Services       Precautions / Restrictions Precautions Precautions: Fall;Knee Precaution Booklet Issued: No Precaution Comments: Reviewed knee precautions during ADL. Handout provided by PT at previous visit.  Restrictions Weight Bearing Restrictions: Yes RLE Weight Bearing: Weight bearing as tolerated    Mobility  Bed Mobility Overal bed mobility: Needs Assistance Bed Mobility: Supine to Sit     Supine to sit: Min assist     General bed mobility comments: OOB in chair on OT arrival.   Transfers Overall transfer level: Needs assistance Equipment used: Rolling walker (2 wheeled) Transfers: Sit to/from Stand Sit to Stand: Supervision         General transfer comment: Supervision for safety and VC's for safe hand placement.   Ambulation/Gait Ambulation/Gait assistance: Min guard Ambulation Distance (Feet): 180 Feet Assistive device: Rolling walker (2 wheeled) Gait Pattern/deviations: Decreased step length - right;Decreased weight shift to right;Antalgic;Trunk flexed;Decreased stride length;Step-through pattern Gait velocity: Decreased   General Gait Details: Pt remains slow and guarded due to pain. Cues for hip and trunk extension.  Pt presents  with B feet ER.  Cues for neutral alignment of hips to avoid ER.    Stairs Stairs: Yes   Stair Management: No rails;Forwards;With walker;Backwards Number of Stairs: 2 General stair comments: curb training: cues for sequencing and RW placement.  backwards to ascend and forwards to descend.    Wheelchair Mobility    Modified Rankin (Stroke Patients Only)       Balance Overall balance assessment: Needs assistance Sitting-balance support: No upper extremity supported;Feet supported Sitting balance-Leahy Scale: Good     Standing balance support: Bilateral upper extremity supported;During functional activity Standing balance-Leahy Scale: Poor Standing balance comment: Reliant on RW for stability                             Cognition Arousal/Alertness: Awake/alert Behavior During Therapy: WFL for tasks assessed/performed Overall Cognitive Status: Within Functional Limits for tasks assessed                                        Exercises Total Joint Exercises Ankle Circles/Pumps: AROM;Both;10 reps;Supine Quad Sets: AROM;Right;10 reps;Supine Towel Squeeze: AROM;Both;10 reps;Supine Short Arc Quad: Right;10 reps;Supine;AAROM Heel Slides: Right;10 reps;Supine;AAROM Hip ABduction/ADduction: Right;10 reps;Supine;AAROM Straight Leg Raises: Right;10 reps;Supine;AAROM Long Arc Quad: AAROM;Right;10 reps;Seated Goniometric ROM: 78 degrees flexion in R knee.      General Comments        Pertinent Vitals/Pain Pain Assessment: Faces Pain Score: 6  Faces Pain Scale: Hurts even more Pain Location: R knee  Pain Descriptors /  Indicators: Aching;Operative site guarding;Sore;Grimacing Pain Intervention(s): Monitored during session;Repositioned    Home Living                      Prior Function            PT Goals (current goals can now be found in the care plan section) Acute Rehab PT Goals Patient Stated Goal: to decrease pain  Potential  to Achieve Goals: Good Progress towards PT goals: Progressing toward goals    Frequency    7X/week      PT Plan Current plan remains appropriate    Co-evaluation              AM-PAC PT "6 Clicks" Daily Activity  Outcome Measure  Difficulty turning over in bed (including adjusting bedclothes, sheets and blankets)?: Total Difficulty moving from lying on back to sitting on the side of the bed? : Total Difficulty sitting down on and standing up from a chair with arms (e.g., wheelchair, bedside commode, etc,.)?: Total Help needed moving to and from a bed to chair (including a wheelchair)?: A Little Help needed walking in hospital room?: A Little Help needed climbing 3-5 steps with a railing? : A Little 6 Click Score: 12    End of Session Equipment Utilized During Treatment: Gait belt Activity Tolerance: Patient limited by pain Patient left: in chair;with call bell/phone within reach;with family/visitor present Nurse Communication: Mobility status PT Visit Diagnosis: Other abnormalities of gait and mobility (R26.89);Pain Pain - Right/Left: Right Pain - part of body: Knee     Time: 1610-9604 PT Time Calculation (min) (ACUTE ONLY): 27 min  Charges:  $Gait Training: 8-22 mins $Therapeutic Exercise: 8-22 mins                    G Codes:       Joycelyn Rua, PTA pager [phone]    Florestine Avers 07/27/2016, 5:45 PM

## 2016-07-27 NOTE — Progress Notes (Signed)
Occupational Therapy Treatment Patient Details Name: [name] MRN: [id number] DOB: 09-13-1927 Today's Date: 07/27/2016    History of present illness Pt is 83 y/o female s/p elective R TKA secondary to R knee OA. PMH includes aneurysm (non-repairable), CKD, HTN, bilat neuropathy, L ankle fracture surgery, L shoulder surgery, and bilat THA.    OT comments  Pt progressing well toward OT goals. Pt and family educated on dressing techniques with reacher as well as safe shower transfers. Pt demonstrates improved ability to complete toilet transfers with supervision and walk-in shower transfers with min assist this session. She will have 24 hour assistance from her family who demonstrate the ability to provide the necessary assistance. All OT education complete and feel pt is safe to D/C home with 24 hour assistance from family from OT perspective.    Follow Up Recommendations  DC plan and follow up therapy as arranged by surgeon    Equipment Recommendations  None recommended by OT    Recommendations for Other Services      Precautions / Restrictions Precautions Precautions: Fall;Knee Precaution Booklet Issued: No Precaution Comments: Reviewed knee precautions during ADL. Handout provided by PT at previous visit.  Restrictions Weight Bearing Restrictions: Yes RLE Weight Bearing: Weight bearing as tolerated       Mobility Bed Mobility               General bed mobility comments: OOB in chair on OT arrival.   Transfers Overall transfer level: Needs assistance Equipment used: Rolling walker (2 wheeled) Transfers: Sit to/from Stand Sit to Stand: Supervision         General transfer comment: Supervision for safety and VC's for safe hand placement.     Balance Overall balance assessment: Needs assistance Sitting-balance support: No upper extremity supported;Feet supported Sitting balance-Leahy Scale: Good     Standing balance support: Bilateral upper extremity  supported;During functional activity Standing balance-Leahy Scale: Poor Standing balance comment: Reliant on RW for stability                            ADL either performed or assessed with clinical judgement   ADL Overall ADL's : Needs assistance/impaired                         Toilet Transfer: Supervision/safety;RW;BSC       Tub/ Engineer, structural: Minimal assistance;Shower seat;Ambulation;Rolling walker;Walk-in shower   Functional mobility during ADLs: Minimal assistance;Cueing for safety General ADL Comments: Pt dressed on OT arrival. Educated pt on dressing techniques and use of reacher to assist with LB dressing tasks and she verbalized understanding. Educated pt and her family concerning safe shower transfers with chower seat. Pt demonstrates good understanding and required min assist for safety and balance.      Vision       Perception     Praxis      Cognition Arousal/Alertness: Awake/alert Behavior During Therapy: WFL for tasks assessed/performed Overall Cognitive Status: Within Functional Limits for tasks assessed                                          Exercises     Shoulder Instructions       General Comments      Pertinent Vitals/ Pain       Pain Assessment: Faces Faces Pain  Scale: Hurts even more Pain Location: R knee  Pain Descriptors / Indicators: Aching;Operative site guarding;Sore;Grimacing Pain Intervention(s): Monitored during session;Repositioned  Home Living                                          Prior Functioning/Environment              Frequency  Min 2X/week        Progress Toward Goals  OT Goals(current goals can now be found in the care plan section)  Progress towards OT goals: Progressing toward goals (3/6 initial goals met)  Acute Rehab OT Goals Patient Stated Goal: to decrease pain  OT Goal Formulation: With patient/family Time For Goal Achievement:  08/02/16 Potential to Achieve Goals: Good ADL Goals Pt Will Perform Grooming: with min guard assist;with supervision;standing;with caregiver independent in assisting Pt Will Perform Lower Body Bathing: with min assist;sitting/lateral leans;sit to/from stand;with caregiver independent in assisting Pt Will Perform Lower Body Dressing: with min assist;with caregiver independent in assisting;sitting/lateral leans;sit to/from stand Pt Will Transfer to Toilet: with min guard assist;bedside commode;regular height toilet;grab bars;ambulating Pt Will Perform Toileting - Clothing Manipulation and hygiene: with min guard assist;sit to/from stand;with caregiver independent in assisting Pt Will Perform Tub/Shower Transfer: with min assist;with min guard assist;ambulating;shower seat;3 in 1;rolling walker;with caregiver independent in assisting  Plan Discharge plan needs to be updated    Co-evaluation                 AM-PAC PT "6 Clicks" Daily Activity     Outcome Measure   Help from another person eating meals?: None Help from another person taking care of personal grooming?: A Little Help from another person toileting, which includes using toliet, bedpan, or urinal?: A Little Help from another person bathing (including washing, rinsing, drying)?: A Little Help from another person to put on and taking off regular upper body clothing?: None Help from another person to put on and taking off regular lower body clothing?: A Lot 6 Click Score: 19    End of Session Equipment Utilized During Treatment: Gait belt;Rolling walker  OT Visit Diagnosis: Other abnormalities of gait and mobility (R26.89);Pain;Muscle weakness (generalized) (M62.81) Pain - Right/Left: Right Pain - part of body: Knee;Leg   Activity Tolerance Patient tolerated treatment well   Patient Left in chair;with call bell/phone within reach;with family/visitor present   Nurse Communication Mobility status        Time:  1121-1140 OT Time Calculation (min): 19 min  Charges: OT General Charges $OT Visit: 1 Procedure OT Treatments $Self Care/Home Management : 8-22 mins  Connie Herrlich, MS OTR/L  Pager: [name] 07/27/2016, 12:48 PM

## 2016-07-27 NOTE — Progress Notes (Signed)
Subjective: 2 Days Post-Op Procedure(s) (LRB): TOTAL KNEE ARTHROPLASTY (Right) Patient reports pain as moderate.  Doing much better this am after having increased pain meds yesterday  Objective: Vital signs in last 24 hours: Temp:  [97.7 F (36.5 C)-98.2 F (36.8 C)] 97.8 F (36.6 C) (06/22 0443) Pulse Rate:  [56-65] 65 (06/22 0443) Resp:  [18] 18 (06/22 0443) BP: (108-132)/(35-51) 132/45 (06/22 0443) SpO2:  [95 %-98 %] 96 % (06/22 0443)  Intake/Output from previous day: 06/21 0701 - 06/22 0700 In: 1227.5 [P.O.:480; I.V.:747.5] Out: 700 [Urine:700] Intake/Output this shift: No intake/output data recorded.   Recent Labs  07/26/16 0534 07/27/16 0557  HGB 10.4* 10.2*    Recent Labs  07/26/16 0534 07/27/16 0557  WBC 5.7 6.7  RBC 3.65* 3.42*  HCT 33.0* 32.0*  PLT 205 187    Recent Labs  07/26/16 0534 07/27/16 0557  NA 137 141  K 4.6 5.0  CL 105 108  CO2 24 26  BUN 14 14  CREATININE 1.01* 0.90  GLUCOSE 112* 106*  CALCIUM 8.5* 8.9   No results for input(s): LABPT, INR in the last 72 hours.  Neurologically intact Neurovascular intact Sensation intact distally Intact pulses distally Dorsiflexion/Plantar flexion intact Incision: dressing C/D/I No cellulitis present Compartment soft  Assessment/Plan: 2 Days Post-Op Procedure(s) (LRB): TOTAL KNEE ARTHROPLASTY (Right) Advance diet Up with therapy D/C IV fluids Discharge home with home health after second session of PT WBAT RLE PLEASE REMOVE ACE BANDAGE AND APPLY TED HOSE TO OPERATIVE EXTREMITY  Otilio SaberM Lindsey Rebbecca Osuna 07/27/2016, 8:35 AM

## 2016-08-07 ENCOUNTER — Ambulatory Visit (HOSPITAL_COMMUNITY)
Admission: RE | Admit: 2016-08-07 | Discharge: 2016-08-07 | Disposition: A | Discharge status: Home / Self Care | Payer: Medicare Other | Source: Ambulatory Visit | Attending: Orthopedic Surgery | Admitting: Orthopedic Surgery

## 2016-08-07 ENCOUNTER — Other Ambulatory Visit (HOSPITAL_COMMUNITY): Payer: Self-pay | Admitting: Orthopedic Surgery

## 2016-08-07 DIAGNOSIS — M79604 Pain in right leg: Secondary | ICD-10-CM | POA: Insufficient documentation

## 2016-08-07 DIAGNOSIS — M7989 Other specified soft tissue disorders: Secondary | ICD-10-CM | POA: Diagnosis not present

## 2016-08-07 DIAGNOSIS — I82491 Acute embolism and thrombosis of other specified deep vein of right lower extremity: Secondary | ICD-10-CM | POA: Diagnosis not present

## 2016-08-07 DIAGNOSIS — I82890 Acute embolism and thrombosis of other specified veins: Secondary | ICD-10-CM | POA: Diagnosis not present

## 2016-08-07 NOTE — Progress Notes (Addendum)
**  Preliminary report by tech**  Right lower extremity venous duplex complete. There is evidence of acute deep vein thrombosis involving the gastrocnemius, and peroneal veins of the right lower extremity.  There is no evidence of superficial vein thrombosis involving the right lower extremity. There is no evidence a Baker's cyst on the right. Results were given to [place] PA.  08/07/16 2:26 PM Olen CordialGreg Zyire Eidson RVT

## 2016-08-20 ENCOUNTER — Emergency Department (HOSPITAL_COMMUNITY): Payer: Medicare Other

## 2016-08-20 ENCOUNTER — Inpatient Hospital Stay (HOSPITAL_COMMUNITY)
Admission: EM | Admit: 2016-08-20 | Discharge: 2016-08-25 | DRG: 308 | Disposition: A | Discharge status: Home / Self Care | Payer: Medicare Other | Attending: Internal Medicine | Admitting: Internal Medicine

## 2016-08-20 ENCOUNTER — Encounter (HOSPITAL_COMMUNITY): Payer: Self-pay | Admitting: Emergency Medicine

## 2016-08-20 DIAGNOSIS — D638 Anemia in other chronic diseases classified elsewhere: Secondary | ICD-10-CM | POA: Diagnosis present

## 2016-08-20 DIAGNOSIS — J810 Acute pulmonary edema: Secondary | ICD-10-CM | POA: Diagnosis not present

## 2016-08-20 DIAGNOSIS — I82401 Acute embolism and thrombosis of unspecified deep veins of right lower extremity: Secondary | ICD-10-CM | POA: Diagnosis present

## 2016-08-20 DIAGNOSIS — Z86718 Personal history of other venous thrombosis and embolism: Secondary | ICD-10-CM

## 2016-08-20 DIAGNOSIS — Z96651 Presence of right artificial knee joint: Secondary | ICD-10-CM | POA: Diagnosis present

## 2016-08-20 DIAGNOSIS — N183 Chronic kidney disease, stage 3 (moderate): Secondary | ICD-10-CM | POA: Diagnosis present

## 2016-08-20 DIAGNOSIS — Q211 Atrial septal defect: Secondary | ICD-10-CM

## 2016-08-20 DIAGNOSIS — I480 Paroxysmal atrial fibrillation: Secondary | ICD-10-CM | POA: Diagnosis present

## 2016-08-20 DIAGNOSIS — I272 Pulmonary hypertension, unspecified: Secondary | ICD-10-CM | POA: Diagnosis present

## 2016-08-20 DIAGNOSIS — N390 Urinary tract infection, site not specified: Secondary | ICD-10-CM | POA: Diagnosis present

## 2016-08-20 DIAGNOSIS — I4891 Unspecified atrial fibrillation: Principal | ICD-10-CM | POA: Diagnosis present

## 2016-08-20 DIAGNOSIS — N3000 Acute cystitis without hematuria: Secondary | ICD-10-CM

## 2016-08-20 DIAGNOSIS — M1990 Unspecified osteoarthritis, unspecified site: Secondary | ICD-10-CM | POA: Diagnosis present

## 2016-08-20 DIAGNOSIS — I081 Rheumatic disorders of both mitral and tricuspid valves: Secondary | ICD-10-CM | POA: Diagnosis present

## 2016-08-20 DIAGNOSIS — I5023 Acute on chronic systolic (congestive) heart failure: Secondary | ICD-10-CM | POA: Diagnosis present

## 2016-08-20 DIAGNOSIS — E039 Hypothyroidism, unspecified: Secondary | ICD-10-CM | POA: Diagnosis present

## 2016-08-20 DIAGNOSIS — K219 Gastro-esophageal reflux disease without esophagitis: Secondary | ICD-10-CM | POA: Diagnosis present

## 2016-08-20 DIAGNOSIS — I509 Heart failure, unspecified: Secondary | ICD-10-CM

## 2016-08-20 DIAGNOSIS — Z96643 Presence of artificial hip joint, bilateral: Secondary | ICD-10-CM | POA: Diagnosis present

## 2016-08-20 DIAGNOSIS — G629 Polyneuropathy, unspecified: Secondary | ICD-10-CM | POA: Diagnosis present

## 2016-08-20 DIAGNOSIS — F419 Anxiety disorder, unspecified: Secondary | ICD-10-CM | POA: Diagnosis present

## 2016-08-20 DIAGNOSIS — Z96659 Presence of unspecified artificial knee joint: Secondary | ICD-10-CM

## 2016-08-20 DIAGNOSIS — I130 Hypertensive heart and chronic kidney disease with heart failure and stage 1 through stage 4 chronic kidney disease, or unspecified chronic kidney disease: Secondary | ICD-10-CM | POA: Diagnosis present

## 2016-08-20 HISTORY — DX: Heart failure, unspecified: I50.9

## 2016-08-20 NOTE — ED Triage Notes (Signed)
Pt reports heart racing X2 days with SOB, dizziness. Family states her HR was 130's earlier this evening.

## 2016-08-21 ENCOUNTER — Emergency Department (HOSPITAL_COMMUNITY): Payer: Medicare Other

## 2016-08-21 ENCOUNTER — Encounter (HOSPITAL_COMMUNITY): Payer: Self-pay | Admitting: Radiology

## 2016-08-21 ENCOUNTER — Observation Stay (HOSPITAL_COMMUNITY): Payer: Medicare Other

## 2016-08-21 DIAGNOSIS — I82401 Acute embolism and thrombosis of unspecified deep veins of right lower extremity: Secondary | ICD-10-CM | POA: Diagnosis present

## 2016-08-21 DIAGNOSIS — I509 Heart failure, unspecified: Secondary | ICD-10-CM | POA: Diagnosis not present

## 2016-08-21 DIAGNOSIS — I5023 Acute on chronic systolic (congestive) heart failure: Secondary | ICD-10-CM | POA: Diagnosis present

## 2016-08-21 DIAGNOSIS — Z86718 Personal history of other venous thrombosis and embolism: Secondary | ICD-10-CM | POA: Diagnosis not present

## 2016-08-21 DIAGNOSIS — I824Y1 Acute embolism and thrombosis of unspecified deep veins of right proximal lower extremity: Secondary | ICD-10-CM

## 2016-08-21 DIAGNOSIS — E038 Other specified hypothyroidism: Secondary | ICD-10-CM | POA: Diagnosis not present

## 2016-08-21 DIAGNOSIS — I4891 Unspecified atrial fibrillation: Secondary | ICD-10-CM | POA: Diagnosis present

## 2016-08-21 DIAGNOSIS — D638 Anemia in other chronic diseases classified elsewhere: Secondary | ICD-10-CM | POA: Diagnosis present

## 2016-08-21 DIAGNOSIS — I5022 Chronic systolic (congestive) heart failure: Secondary | ICD-10-CM | POA: Diagnosis not present

## 2016-08-21 DIAGNOSIS — F419 Anxiety disorder, unspecified: Secondary | ICD-10-CM | POA: Diagnosis present

## 2016-08-21 DIAGNOSIS — N390 Urinary tract infection, site not specified: Secondary | ICD-10-CM | POA: Diagnosis present

## 2016-08-21 DIAGNOSIS — Q211 Atrial septal defect: Secondary | ICD-10-CM | POA: Diagnosis not present

## 2016-08-21 DIAGNOSIS — E039 Hypothyroidism, unspecified: Secondary | ICD-10-CM | POA: Diagnosis present

## 2016-08-21 DIAGNOSIS — M1990 Unspecified osteoarthritis, unspecified site: Secondary | ICD-10-CM | POA: Diagnosis present

## 2016-08-21 DIAGNOSIS — I10 Essential (primary) hypertension: Secondary | ICD-10-CM | POA: Diagnosis not present

## 2016-08-21 DIAGNOSIS — I272 Pulmonary hypertension, unspecified: Secondary | ICD-10-CM | POA: Diagnosis present

## 2016-08-21 DIAGNOSIS — Z96651 Presence of right artificial knee joint: Secondary | ICD-10-CM

## 2016-08-21 DIAGNOSIS — I361 Nonrheumatic tricuspid (valve) insufficiency: Secondary | ICD-10-CM | POA: Diagnosis not present

## 2016-08-21 DIAGNOSIS — I5021 Acute systolic (congestive) heart failure: Secondary | ICD-10-CM | POA: Diagnosis not present

## 2016-08-21 DIAGNOSIS — G629 Polyneuropathy, unspecified: Secondary | ICD-10-CM | POA: Diagnosis present

## 2016-08-21 DIAGNOSIS — I340 Nonrheumatic mitral (valve) insufficiency: Secondary | ICD-10-CM | POA: Diagnosis not present

## 2016-08-21 DIAGNOSIS — Z96643 Presence of artificial hip joint, bilateral: Secondary | ICD-10-CM | POA: Diagnosis present

## 2016-08-21 DIAGNOSIS — J810 Acute pulmonary edema: Secondary | ICD-10-CM | POA: Diagnosis present

## 2016-08-21 DIAGNOSIS — I130 Hypertensive heart and chronic kidney disease with heart failure and stage 1 through stage 4 chronic kidney disease, or unspecified chronic kidney disease: Secondary | ICD-10-CM | POA: Diagnosis present

## 2016-08-21 DIAGNOSIS — I480 Paroxysmal atrial fibrillation: Secondary | ICD-10-CM | POA: Diagnosis present

## 2016-08-21 DIAGNOSIS — I081 Rheumatic disorders of both mitral and tricuspid valves: Secondary | ICD-10-CM | POA: Diagnosis present

## 2016-08-21 DIAGNOSIS — N183 Chronic kidney disease, stage 3 (moderate): Secondary | ICD-10-CM | POA: Diagnosis present

## 2016-08-21 DIAGNOSIS — K219 Gastro-esophageal reflux disease without esophagitis: Secondary | ICD-10-CM | POA: Diagnosis present

## 2016-08-21 DIAGNOSIS — B964 Proteus (mirabilis) (morganii) as the cause of diseases classified elsewhere: Secondary | ICD-10-CM | POA: Diagnosis not present

## 2016-08-21 DIAGNOSIS — Z96659 Presence of unspecified artificial knee joint: Secondary | ICD-10-CM

## 2016-08-21 HISTORY — DX: Heart failure, unspecified: I50.9

## 2016-08-21 LAB — BASIC METABOLIC PANEL
Anion gap: 10 (ref 5–15)
Anion gap: 7 (ref 5–15)
BUN: 14 mg/dL (ref 6–20)
BUN: 15 mg/dL (ref 6–20)
CALCIUM: 9 mg/dL (ref 8.9–10.3)
CHLORIDE: 103 mmol/L (ref 101–111)
CHLORIDE: 103 mmol/L (ref 101–111)
CO2: 26 mmol/L (ref 22–32)
CO2: 27 mmol/L (ref 22–32)
CREATININE: 1 mg/dL (ref 0.44–1.00)
Calcium: 9.3 mg/dL (ref 8.9–10.3)
Creatinine, Ser: 0.98 mg/dL (ref 0.44–1.00)
GFR calc Af Amer: 58 mL/min — ABNORMAL LOW (ref >60)
GFR calc non Af Amer: 49 mL/min — ABNORMAL LOW (ref >60)
GFR calc non Af Amer: 50 mL/min — ABNORMAL LOW (ref >60)
GFR, EST AFRICAN AMERICAN: 57 mL/min — AB (ref >60)
GLUCOSE: 113 mg/dL — AB (ref 65–99)
Glucose, Bld: 146 mg/dL — ABNORMAL HIGH (ref 65–99)
POTASSIUM: 4.6 mmol/L (ref 3.5–5.1)
Potassium: 5 mmol/L (ref 3.5–5.1)
SODIUM: 137 mmol/L (ref 135–145)
Sodium: 139 mmol/L (ref 135–145)

## 2016-08-21 LAB — MAGNESIUM: Magnesium: 2.3 mg/dL (ref 1.7–2.4)

## 2016-08-21 LAB — TROPONIN I: Troponin I: <0.03 ng/mL (ref <0.03)

## 2016-08-21 LAB — URINALYSIS, ROUTINE W REFLEX MICROSCOPIC
BILIRUBIN URINE: NEGATIVE
Glucose, UA: NEGATIVE mg/dL
Hgb urine dipstick: NEGATIVE
KETONES UR: NEGATIVE mg/dL
Nitrite: POSITIVE — AB
PROTEIN: NEGATIVE mg/dL
Specific Gravity, Urine: 1.033 — ABNORMAL HIGH (ref 1.005–1.030)
pH: 7 (ref 5.0–8.0)

## 2016-08-21 LAB — CBC
HCT: 32.8 % — ABNORMAL LOW (ref 36.0–46.0)
HEMATOCRIT: 33.8 % — AB (ref 36.0–46.0)
HEMOGLOBIN: 10.3 g/dL — AB (ref 12.0–15.0)
HEMOGLOBIN: 10.7 g/dL — AB (ref 12.0–15.0)
MCH: 29.9 pg (ref 26.0–34.0)
MCH: 29.9 pg (ref 26.0–34.0)
MCHC: 31.4 g/dL (ref 30.0–36.0)
MCHC: 31.7 g/dL (ref 30.0–36.0)
MCV: 94.4 fL (ref 78.0–100.0)
MCV: 95.1 fL (ref 78.0–100.0)
Platelets: 372 10*3/uL (ref 150–400)
Platelets: 384 10*3/uL (ref 150–400)
RBC: 3.45 MIL/uL — ABNORMAL LOW (ref 3.87–5.11)
RBC: 3.58 MIL/uL — ABNORMAL LOW (ref 3.87–5.11)
RDW: 16.3 % — AB (ref 11.5–15.5)
RDW: 16.3 % — AB (ref 11.5–15.5)
WBC: 6.4 10*3/uL (ref 4.0–10.5)
WBC: 6.7 10*3/uL (ref 4.0–10.5)

## 2016-08-21 LAB — I-STAT TROPONIN, ED: Troponin i, poc: 0.03 ng/mL (ref 0.00–0.08)

## 2016-08-21 LAB — BRAIN NATRIURETIC PEPTIDE: B Natriuretic Peptide: 408 pg/mL — ABNORMAL HIGH (ref 0.0–100.0)

## 2016-08-21 LAB — TSH: TSH: 2.58 u[IU]/mL (ref 0.350–4.500)

## 2016-08-21 LAB — MRSA PCR SCREENING: MRSA BY PCR: NEGATIVE

## 2016-08-21 MED ORDER — SERTRALINE HCL 100 MG PO TABS
100.0000 mg | ORAL_TABLET | Freq: Every day | ORAL | Status: DC
  Administered 2016-08-21 – 2016-08-25 (×5): 100 mg via ORAL
  Filled 2016-08-21 (×5): qty 1

## 2016-08-21 MED ORDER — ACETAMINOPHEN 325 MG PO TABS: 650.0000 mg | ORAL_TABLET | Freq: Four times a day (QID) | ORAL | Status: DC | PRN

## 2016-08-21 MED ORDER — METOPROLOL TARTRATE 5 MG/5ML IV SOLN
5.0000 mg | Freq: Four times a day (QID) | INTRAVENOUS | Status: DC | PRN
  Administered 2016-08-24: 5 mg via INTRAVENOUS
  Filled 2016-08-21: qty 5

## 2016-08-21 MED ORDER — SERTRALINE HCL 50 MG PO TABS: 50.0000 mg | ORAL_TABLET | ORAL | Status: DC

## 2016-08-21 MED ORDER — ONDANSETRON HCL 4 MG PO TABS: 4.0000 mg | ORAL_TABLET | Freq: Four times a day (QID) | ORAL | Status: DC | PRN

## 2016-08-21 MED ORDER — HYDROCODONE-ACETAMINOPHEN 5-325 MG PO TABS: 0.5000 | ORAL_TABLET | ORAL | Status: DC | PRN

## 2016-08-21 MED ORDER — LEVOTHYROXINE SODIUM 88 MCG PO TABS
88.0000 ug | ORAL_TABLET | Freq: Every day | ORAL | Status: DC
  Administered 2016-08-21 – 2016-08-25 (×5): 88 ug via ORAL
  Filled 2016-08-21 (×5): qty 1

## 2016-08-21 MED ORDER — LATANOPROST 0.005 % OP SOLN
1.0000 [drp] | Freq: Every day | OPHTHALMIC | Status: DC
  Administered 2016-08-21 – 2016-08-24 (×4): 1 [drp] via OPHTHALMIC
  Filled 2016-08-21: qty 2.5

## 2016-08-21 MED ORDER — RIVAROXABAN 15 MG PO TABS
15.0000 mg | ORAL_TABLET | Freq: Two times a day (BID) | ORAL | Status: DC
  Administered 2016-08-21 – 2016-08-25 (×9): 15 mg via ORAL
  Filled 2016-08-21 (×8): qty 1

## 2016-08-21 MED ORDER — DILTIAZEM LOAD VIA INFUSION
10.0000 mg | Freq: Once | INTRAVENOUS | Status: AC
  Administered 2016-08-21: 10 mg via INTRAVENOUS
  Filled 2016-08-21: qty 10

## 2016-08-21 MED ORDER — METOPROLOL TARTRATE 5 MG/5ML IV SOLN
5.0000 mg | Freq: Once | INTRAVENOUS | Status: AC
  Administered 2016-08-21: 5 mg via INTRAVENOUS
  Filled 2016-08-21: qty 5

## 2016-08-21 MED ORDER — IOPAMIDOL (ISOVUE-370) INJECTION 76%
INTRAVENOUS | Status: AC
  Administered 2016-08-21: 100 mL
  Filled 2016-08-21: qty 100

## 2016-08-21 MED ORDER — DILTIAZEM HCL 100 MG IV SOLR
5.0000 mg/h | INTRAVENOUS | Status: DC
  Administered 2016-08-21: 5 mg/h via INTRAVENOUS
  Administered 2016-08-21: 12.5 mg/h via INTRAVENOUS
  Administered 2016-08-21 – 2016-08-22 (×2): 10 mg/h via INTRAVENOUS
  Administered 2016-08-22: 12.5 mg/h via INTRAVENOUS
  Administered 2016-08-23: 10 mg/h via INTRAVENOUS
  Filled 2016-08-21 (×6): qty 100

## 2016-08-21 MED ORDER — RIVAROXABAN 15 MG PO TABS: 15.0000 mg | ORAL_TABLET | Freq: Two times a day (BID) | ORAL | Status: DC

## 2016-08-21 MED ORDER — FUROSEMIDE 10 MG/ML IJ SOLN
20.0000 mg | Freq: Once | INTRAMUSCULAR | Status: AC
  Administered 2016-08-21: 20 mg via INTRAVENOUS
  Filled 2016-08-21: qty 2

## 2016-08-21 MED ORDER — CEFTRIAXONE SODIUM 1 G IJ SOLR
1.0000 g | Freq: Once | INTRAMUSCULAR | Status: AC
  Administered 2016-08-21: 1 g via INTRAVENOUS
  Filled 2016-08-21: qty 10

## 2016-08-21 MED ORDER — RIVAROXABAN 20 MG PO TABS: 20.0000 mg | ORAL_TABLET | Freq: Every day | ORAL | Status: DC

## 2016-08-21 MED ORDER — ONDANSETRON HCL 4 MG/2ML IJ SOLN: 4.0000 mg | Freq: Four times a day (QID) | INTRAMUSCULAR | Status: DC | PRN

## 2016-08-21 MED ORDER — ACETAMINOPHEN 650 MG RE SUPP: 650.0000 mg | Freq: Four times a day (QID) | RECTAL | Status: DC | PRN

## 2016-08-21 MED ORDER — SERTRALINE HCL 50 MG PO TABS
50.0000 mg | ORAL_TABLET | Freq: Every evening | ORAL | Status: DC
  Administered 2016-08-21 – 2016-08-24 (×4): 50 mg via ORAL
  Filled 2016-08-21 (×4): qty 1

## 2016-08-21 MED ORDER — ALBUTEROL SULFATE (2.5 MG/3ML) 0.083% IN NEBU: 2.5000 mg | INHALATION_SOLUTION | RESPIRATORY_TRACT | Status: DC | PRN

## 2016-08-21 MED ORDER — ASPIRIN 81 MG PO CHEW
324.0000 mg | CHEWABLE_TABLET | Freq: Once | ORAL | Status: AC
  Administered 2016-08-21: 324 mg via ORAL
  Filled 2016-08-21: qty 4

## 2016-08-21 MED ORDER — DEXTROSE 5 % IV SOLN
1.0000 g | INTRAVENOUS | Status: DC
  Administered 2016-08-21 – 2016-08-24 (×4): 1 g via INTRAVENOUS
  Filled 2016-08-21 (×4): qty 10

## 2016-08-21 NOTE — Progress Notes (Signed)
Pt still in At Fib HR varies between 105 up to 150's increased Cardizem gtt to 12.5 BP stable Text page to Dr Joseph ArtWoods to make aware

## 2016-08-21 NOTE — ED Notes (Signed)
Patient transported to CT 

## 2016-08-21 NOTE — Progress Notes (Signed)
  Echocardiogram 2D Echocardiogram has been performed.  [name], [name] 08/21/2016, 5:06 PM

## 2016-08-21 NOTE — Progress Notes (Signed)
PROGRESS NOTE    [name]  ZOX:[id number] DOB: [birth year] DOA: 08/20/2016 PCP: Johny Blamer, MD   Brief Narrative:  83 y.o. WF PMHx Anxiety, HTN, Hypothyroidism, Neuropathy, Brain aneurysm, Right leg DVT; CKD stage III   Presents with complaints of her heart beating fast for the last couple of days. Associated symptoms include intermittent chest tightness and mild shortness of breath. Denies any change in weight, cough, headache, bleeding, fever, or chills. Patient had just undergone total right knee replacement on 6/20 by Dr. Eulah Pont. She had presented with complaints of right leg swelling on 7/3 and was found to have a DVT of the right lower extremity for which she was placed on Xarelto. Patient was previously on metoprolol for blood pressure, but this was discontinued after patient was noted to have low blood pressure readings. Patient denies any previous history of irregular heartbeat in the past.  ED Course: Upon admission into the emergency department patient was seen to be in A. fib with RVR with heart rate of 142 bpm. Labs revealed hemoglobin 10.7, BNP 408, and troponin <0.03. Urinalysis showed signs of possible infection CT angiogram of the chest showed signs of CHF with interstitial edema. Patient was started on a Cardizem drip, given 1 dose of Rocephin, 324 mg of aspirin, and 20 mg of Lasix IV   Subjective: 7/17 A/O 4, positive short-term memory loss (chronic), negative CP, negative SOB, positive funny feeling in chest, negative N/V. Does not recall if she was on Xarelto right TKA prior to DVT.    Assessment & Plan:   Principal Problem:   Atrial fibrillation with RVR (HCC) Active Problems:   Hypothyroidism   Status post total knee replacement   Right leg DVT (HCC)   Acute lower UTI   CHF exacerbation (HCC)   Anemia of chronic disease   Atrial fibrillation with RVR: Acute.(Chadsvasc score= at least 5.) - Patient found to be in A. fib with RVR with heart rate of 142  on admission. No known previous history of atrial fibrillation in the past.   - continue Diltiazem drip -Metoprolol IV PRN HR> 100 -Echocardiogram pending-  CHF exacerbation?:  -No previous echocardiogram for comparison, however BNP = 400, which is consistent with CHF.  Strict in and out Daily weight  Essential hypertension -See atrial fibrillation   S/p total right knee replacement - Physical therapy to eval and treat  Urinary tract infection: - Acute.Patient with positive urinalysis on admission - Follow-up urine culture - Continue Rocephin for now.  Right leg DVT:  Diagnosed on 7/3 Following total knee replacement done on 6/20. - continue Xarelto  Hypothyroidism:  -TSH noted to be 2.58 on admission - Continue levothyroxine 88 g daily  Anemia of chronic disease - Continue to monitor       DVT prophylaxis: Xarelto Code Status: Full Family Communication: None Disposition Plan: TBD   Consultants:  None  Procedures/Significant Events:  7/17 CT angiogram chest PE protocol:-Negative PE.-Cardiomegaly with evidence CHF -Small bilateral pleural effusion/mild interstitial edema -Right hilar and mediastinal adenopathy, likely reactive    VENTILATOR SETTINGS: None   Cultures 7/17 urine pending 7/17 MRSA by PCR negative    Antimicrobials: Anti-infectives    Start     Stop   08/21/16 2200  cefTRIAXone (ROCEPHIN) 1 g in dextrose 5 % 50 mL IVPB         08/21/16 0330  cefTRIAXone (ROCEPHIN) 1 g in dextrose 5 % 50 mL IVPB     08/21/16 0531  Devices None   LINES / TUBES:  None    Continuous Infusions: . cefTRIAXone (ROCEPHIN)  IV    . diltiazem (CARDIZEM) infusion 7.5 mg/hr (08/21/16 0301)     Objective: Vitals:   08/21/16 0615 08/21/16 0700 08/21/16 0740 08/21/16 0800  BP: 119/81 93/62 101/61 115/66  Pulse: (!) 158 86 71 89  Resp: 16 16 (!) 36 19  Temp:      TempSrc:      SpO2: 95% 96% 97% 96%  Weight:      Height:         Intake/Output Summary (Last 24 hours) at 08/21/16 0844 Last data filed at 08/21/16 0740  Gross per 24 hour  Intake                0 ml  Output              300 ml  Net             -300 ml   Filed Weights   08/20/16 2337 08/21/16 0500  Weight: 182 lb (82.6 kg) 188 lb 15 oz (85.7 kg)    Examination:  General: A/O 4, No acute respiratory distress Eyes: negative scleral hemorrhage, negative anisocoria, negative icterus ENT: Negative Runny nose, negative gingival bleeding, Neck:  Negative scars, masses, torticollis, lymphadenopathy, JVD Lungs: Clear to auscultation bilaterally without wheezes or crackles Cardiovascular: Irregular rhythm and rate, without murmur gallop or rub normal S1 and S2 Abdomen: negative abdominal pain, nondistended, positive soft, bowel sounds, no rebound, no ascites, no appreciable mass Extremities: No significant cyanosis, clubbing. Bilateral lower extremity edema 2+Rt>>Lt, positive pain to palpation right calf and posterior knee Skin: Negative rashes, lesions, ulcers Psychiatric:  Negative depression, negative anxiety, negative fatigue, negative mania  Central nervous system:  Cranial nerves II through XII intact, tongue/uvula midline, all extremities muscle strength 5/5, sensation intact throughout,  negative dysarthria, negative expressive aphasia, negative receptive aphasia.  .     Data Reviewed: Care during the described time interval was provided by me .  I have reviewed this patient's available data, including medical history, events of note, physical examination, and all test results as part of my evaluation. I have personally reviewed and interpreted all radiology studies.  CBC:  Recent Labs Lab 08/20/16 2338 08/21/16 0401  WBC 6.7 6.4  HGB 10.7* 10.3*  HCT 33.8* 32.8*  MCV 94.4 95.1  PLT 384 372   Basic Metabolic Panel:  Recent Labs Lab 08/20/16 2338 08/20/16 2348 08/21/16 0401  NA 139  --  137  K 4.6  --  5.0  CL 103  --  103   CO2 26  --  27  GLUCOSE 113*  --  146*  BUN 15  --  14  CREATININE 0.98  --  1.00  CALCIUM 9.3  --  9.0  MG  --  2.3  --    GFR: Estimated Creatinine Clearance: 42.9 mL/min (by C-G formula based on SCr of 1 mg/dL). Liver Function Tests: No results for input(s): AST, ALT, ALKPHOS, BILITOT, PROT, ALBUMIN in the last 168 hours. No results for input(s): LIPASE, AMYLASE in the last 168 hours. No results for input(s): AMMONIA in the last 168 hours. Coagulation Profile: No results for input(s): INR, PROTIME in the last 168 hours. Cardiac Enzymes:  Recent Labs Lab 08/21/16 0401  TROPONINI <0.03   BNP (last 3 results) No results for input(s): PROBNP in the last 8760 hours. HbA1C: No results for input(s): HGBA1C in the last  72 hours. CBG: No results for input(s): GLUCAP in the last 168 hours. Lipid Profile: No results for input(s): CHOL, HDL, LDLCALC, TRIG, CHOLHDL, LDLDIRECT in the last 72 hours. Thyroid Function Tests:  Recent Labs  08/20/16 2348  TSH 2.580   Anemia Panel: No results for input(s): VITAMINB12, FOLATE, FERRITIN, TIBC, IRON, RETICCTPCT in the last 72 hours. Urine analysis:    Component Value Date/Time   COLORURINE YELLOW 08/21/2016 0024   APPEARANCEUR HAZY (A) 08/21/2016 0024   LABSPEC 1.033 (H) 08/21/2016 0024   PHURINE 7.0 08/21/2016 0024   GLUCOSEU NEGATIVE 08/21/2016 0024   HGBUR NEGATIVE 08/21/2016 0024   BILIRUBINUR NEGATIVE 08/21/2016 0024   KETONESUR NEGATIVE 08/21/2016 0024   PROTEINUR NEGATIVE 08/21/2016 0024   UROBILINOGEN 1.0 02/06/2010 1330   NITRITE POSITIVE (A) 08/21/2016 0024   LEUKOCYTESUR MODERATE (A) 08/21/2016 0024   Sepsis Labs: @LABRCNTIP (procalcitonin:4,lacticidven:4)  ) Recent Results (from the past 240 hour(s))  MRSA PCR Screening     Status: None   Collection Time: 08/21/16  5:36 AM  Result Value Ref Range Status   MRSA by PCR NEGATIVE NEGATIVE Final    Comment:        The GeneXpert MRSA Assay (FDA approved for NASAL  specimens only), is one component of a comprehensive MRSA colonization surveillance program. It is not intended to diagnose MRSA infection nor to guide or monitor treatment for MRSA infections.          Radiology Studies: Ct Angio Chest Pe W And/or Wo Contrast  Result Date: 08/21/2016 CLINICAL DATA:  83 year old female with chest pain and shortness of breath. EXAM: CT ANGIOGRAPHY CHEST WITH CONTRAST TECHNIQUE: Multidetector CT imaging of the chest was performed using the standard protocol during bolus administration of intravenous contrast. Multiplanar CT image reconstructions and MIPs were obtained to evaluate the vascular anatomy. CONTRAST:  100 cc Isovue 370 COMPARISON:  Chest radiograph dated 08/19/2016 FINDINGS: Cardiovascular: There is mild cardiomegaly. No pericardial effusion. There is coronary vascular calcification primarily involving the LAD. There is moderate atherosclerotic calcification of the thoracic aorta. The origins of the great vessels of the aortic arch appear patent. There is no CT evidence of pulmonary embolism. Apparent linear density in the right lower lobe segmental artery (series 8, image 248) may be artifactual or represent scarring. Mediastinum/Nodes: Right hilar and mediastinal adenopathy noted. The esophagus and the thyroid gland are grossly unremarkable. Lungs/Pleura: There is mild interstitial prominence which may represent edema. There are small bilateral pleural effusions. There is associated partial compressive atelectasis of the lower lobes. Pneumonia is not excluded. Bibasilar linear atelectasis/scarring noted. The central airways are patent. There is no pneumothorax. Upper Abdomen: No acute abnormality. Musculoskeletal: Degenerative changes of the spine. No acute osseous pathology. Review of the MIP images confirms the above findings. IMPRESSION: 1. No CT evidence of pulmonary embolism. 2. Cardiomegaly with evidence of CHF, small bilateral pleural effusions  and mild interstitial edema. Pneumonia is not excluded. Clinical correlation is recommended. 3. Right hilar and mediastinal adenopathy, likely reactive. 4.  Aortic Atherosclerosis (ICD10-I70.0). Electronically Signed   By: Elgie Collard M.D.   On: 08/21/2016 01:55   Dg Chest Portable 1 View  Result Date: 08/21/2016 CLINICAL DATA:  83 year old female with palpitations. EXAM: PORTABLE CHEST 1 VIEW COMPARISON:  Chest radiograph dated 03/02/2015 FINDINGS: Left lung base atelectatic changes/ scarring noted. There is no focal consolidation, pleural effusion, or pneumothorax. Top-normal cardiac silhouette. There is atherosclerotic calcification of the aortic arch. Osteopenia with degenerative changes of the spine. Left shoulder arthroplasty.  No acute osseous pathology. IMPRESSION: Left lung base atelectasis/scarring. Infiltrate is less likely. Clinical correlation is recommended. Electronically Signed   By: Elgie Collard M.D.   On: 08/21/2016 00:43        Scheduled Meds: . latanoprost  1 drop Both Eyes QHS  . levothyroxine  88 mcg Oral QAC breakfast  . rivaroxaban  15 mg Oral BID WC   Followed by  . [START ON 08/28/2016] rivaroxaban  20 mg Oral Q supper  . sertraline  100 mg Oral QPC breakfast  . sertraline  50 mg Oral QPM   Continuous Infusions: . cefTRIAXone (ROCEPHIN)  IV    . diltiazem (CARDIZEM) infusion 7.5 mg/hr (08/21/16 0301)     LOS: 0 days    Time spent: 40 minutes    WOODS, Roselind Messier, MD [facility] Pager [phone]   If 7PM-7AM, please contact night-coverage [email or web address] Password [facility] 08/21/2016, 8:44 AM

## 2016-08-21 NOTE — H&P (Signed)
History and Physical    [name]:[id number] DOB: 1927-05-29 DOA: 08/20/2016  Referring MD/NP/PA: Dr. Belenda Cruise Ward PCP: Johny Blamer, MD  Patient coming from:home  Chief Complaint: "Heart beating fast"  HPI: [name] is a 83 y.o. female with medical history significant of HTN, hypothyroidism, neuropathy, brain aneurysm, right leg DVT; who presents with complaints of her heart beating fast for the last couple of days. Associated symptoms include intermittent chest tightness and mild shortness of breath. Denies any change in weight, cough, headache, bleeding, fever, or chills. Patient had just undergone total right knee replacement on 6/20 by Dr. Eulah Pont. She had presented with complaints of right leg swelling on 7/3 and was found to have a DVT of the right lower extremity for which she was placed on Xarelto. Patient was previously on metoprolol for blood pressure, but this was discontinued after patient was noted to have low blood pressure readings. Patient denies any previous history of irregular heartbeat in the past.  ED Course: Upon admission into the emergency department patient was seen to be in A. fib with RVR with heart rate of 142 bpm. Labs revealed hemoglobin 10.7, BNP 408, and troponin <0.03. Urinalysis showed signs of possible infection CT angiogram of the chest showed signs of CHF with interstitial edema. Patient was started on a Cardizem drip, given 1 dose of Rocephin, 324 mg of aspirin, and 20 mg of Lasix IV.  Review of Systems: Review of Systems  Constitutional: Negative for weight loss.  HENT: Negative for nosebleeds.   Eyes: Negative for photophobia and pain.  Respiratory: Positive for shortness of breath.   Cardiovascular: Positive for palpitations. Negative for chest pain.  Gastrointestinal: Negative for abdominal pain, diarrhea, nausea and vomiting.  Genitourinary: Negative for frequency and hematuria.  Musculoskeletal: Negative for back pain and falls.    Skin: Negative for itching and rash.  Neurological: Negative for focal weakness and seizures.  Psychiatric/Behavioral: Negative for substance abuse. The patient does not have insomnia.   All other systems reviewed and are negative.   Past Medical History:  Diagnosis Date  . Aneurysm (HCC)    brain  rt side  . Anxiety   . Arthritis   . Chronic kidney disease    stage 3  . Dyspnea   . GERD (gastroesophageal reflux disease)   . Heart murmur    years ago asymp  . History of hiatal hernia   . Hypertension   . Hypothyroidism   . Neuropathy     Past Surgical History:  Procedure Laterality Date  . ABDOMINAL HYSTERECTOMY     1973  . ANKLE FRACTURE SURGERY     left 2009  . CATARACT EXTRACTION, BILATERAL    . CEREBRAL ANEURYSM REPAIR     2009  . CHOLECYSTECTOMY    . JOINT REPLACEMENT     rt hip 1993  . SHOULDER SURGERY     left 2003  . TOTAL HIP ARTHROPLASTY Left 02/08/2016   Procedure: TOTAL HIP ARTHROPLASTY ANTERIOR APPROACH;  Surgeon: Loreta Ave, MD;  Location: [facility] OR;  Service: Orthopedics;  Laterality: Left;  . TOTAL KNEE ARTHROPLASTY Right 07/25/2016  . TOTAL KNEE ARTHROPLASTY Right 07/25/2016   Procedure: TOTAL KNEE ARTHROPLASTY;  Surgeon: Loreta Ave, MD;  Location: [facility] OR;  Service: Orthopedics;  Laterality: Right;     reports that she has never smoked. She has never used smokeless tobacco. She reports that she does not drink alcohol or use drugs.  Allergies  Allergen Reactions  .  No Known Allergies     No family history on file.  Prior to Admission medications   Medication Sig Start Date End Date Taking? Authorizing Provider  HYDROcodone-acetaminophen (NORCO) 5-325 MG tablet Take 1-2 tablets by mouth every 4 (four) hours as needed for moderate pain. Patient taking differently: Take 0.5 tablets by mouth every 4 (four) hours as needed for moderate pain.  07/25/16  Yes Cristie Hem, PA-C  latanoprost (XALATAN) 0.005 % ophthalmic solution Place 1 drop  into both eyes at bedtime.   Yes [provider]  levothyroxine (SYNTHROID, LEVOTHROID) 88 MCG tablet Take 88 mcg by mouth daily before breakfast.   Yes [provider]  Rivaroxaban (XARELTO) 15 MG TABS tablet Take 15 mg by mouth 2 (two) times daily with a meal.   Yes [provider]  sertraline (ZOLOFT) 100 MG tablet Take 50-100 mg by mouth See admin instructions. Take 1 tablet after breakfast and take 1/2 tablet after supper   Yes [provider]  aspirin EC 325 MG tablet Take 1 tablet (325 mg total) by mouth daily. 1 tab a day for the next 30 days to prevent blood clots Patient not taking: Reported on 08/21/2016 07/25/16   Cristie Hem, PA-C  ondansetron (ZOFRAN) 4 MG tablet Take 1 tablet (4 mg total) by mouth every 8 (eight) hours as needed for nausea or vomiting. Patient not taking: Reported on 08/21/2016 07/25/16   Cristie Hem, PA-C    Physical Exam:  Constitutional: NAD, calm, comfortable Vitals:   08/21/16 0100 08/21/16 0145 08/21/16 0200 08/21/16 0230  BP: 122/67 120/86 126/69 106/71  Pulse: (!) 102 (!) 107 81 97  Resp: 20 (!) 23 (!) 21 14  Temp:      TempSrc:      SpO2: 98% 97% 95% 97%  Weight:      Height:       Eyes: PERRL, lids and conjunctivae normal ENMT: Mucous membranes are moist. Posterior pharynx clear of any exudate or lesions.Normal dentition.  Neck: normal, supple, no masses, no thyromegaly Respiratory: clear to auscultation bilaterally, no wheezing, no crackles. Normal respiratory effort. No accessory muscle use.  Cardiovascular: Irregular irregular no murmurs / rubs / gallops. No extremity edema. 2+ pedal pulses. No carotid bruits.  Abdomen: no tenderness, no masses palpated. No hepatosplenomegaly. Bowel sounds positive.  Musculoskeletal: no clubbing / cyanosis. No joint deformity upper and lower extremities. Good ROM, no contractures. Normal muscle tone.  Skin: no rashes, lesions, ulcers. No induration Neurologic: CN  2-12 grossly intact. Sensation abnormal, DTR normal. Strength 5/5 in all 4.  Psychiatric: Normal judgment and insight. Alert and oriented x 3. Normal mood.     Labs on Admission: I have personally reviewed following labs and imaging studies  CBC:  Recent Labs Lab 08/20/16 2338  WBC 6.7  HGB 10.7*  HCT 33.8*  MCV 94.4  PLT 384   Basic Metabolic Panel:  Recent Labs Lab 08/20/16 2338 08/20/16 2348  NA 139  --   K 4.6  --   CL 103  --   CO2 26  --   GLUCOSE 113*  --   BUN 15  --   CREATININE 0.98  --   CALCIUM 9.3  --   MG  --  2.3   GFR: Estimated Creatinine Clearance: 42.9 mL/min (by C-G formula based on SCr of 0.98 mg/dL). Liver Function Tests: No results for input(s): AST, ALT, ALKPHOS, BILITOT, PROT, ALBUMIN in the last 168 hours. No results for  input(s): LIPASE, AMYLASE in the last 168 hours. No results for input(s): AMMONIA in the last 168 hours. Coagulation Profile: No results for input(s): INR, PROTIME in the last 168 hours. Cardiac Enzymes: No results for input(s): CKTOTAL, CKMB, CKMBINDEX, TROPONINI in the last 168 hours. BNP (last 3 results) No results for input(s): PROBNP in the last 8760 hours. HbA1C: No results for input(s): HGBA1C in the last 72 hours. CBG: No results for input(s): GLUCAP in the last 168 hours. Lipid Profile: No results for input(s): CHOL, HDL, LDLCALC, TRIG, CHOLHDL, LDLDIRECT in the last 72 hours. Thyroid Function Tests:  Recent Labs  08/20/16 2348  TSH 2.580   Anemia Panel: No results for input(s): VITAMINB12, FOLATE, FERRITIN, TIBC, IRON, RETICCTPCT in the last 72 hours. Urine analysis:    Component Value Date/Time   COLORURINE YELLOW 08/21/2016 0024   APPEARANCEUR HAZY (A) 08/21/2016 0024   LABSPEC 1.033 (H) 08/21/2016 0024   PHURINE 7.0 08/21/2016 0024   GLUCOSEU NEGATIVE 08/21/2016 0024   HGBUR NEGATIVE 08/21/2016 0024   BILIRUBINUR NEGATIVE 08/21/2016 0024   KETONESUR NEGATIVE 08/21/2016 0024   PROTEINUR  NEGATIVE 08/21/2016 0024   UROBILINOGEN 1.0 02/06/2010 1330   NITRITE POSITIVE (A) 08/21/2016 0024   LEUKOCYTESUR MODERATE (A) 08/21/2016 0024   Sepsis Labs: No results found for this or any previous visit (from the past 240 hour(s)).   Radiological Exams on Admission: Ct Angio Chest Pe W And/or Wo Contrast  Result Date: 08/21/2016 CLINICAL DATA:  83 year old female with chest pain and shortness of breath. EXAM: CT ANGIOGRAPHY CHEST WITH CONTRAST TECHNIQUE: Multidetector CT imaging of the chest was performed using the standard protocol during bolus administration of intravenous contrast. Multiplanar CT image reconstructions and MIPs were obtained to evaluate the vascular anatomy. CONTRAST:  100 cc Isovue 370 COMPARISON:  Chest radiograph dated 08/19/2016 FINDINGS: Cardiovascular: There is mild cardiomegaly. No pericardial effusion. There is coronary vascular calcification primarily involving the LAD. There is moderate atherosclerotic calcification of the thoracic aorta. The origins of the great vessels of the aortic arch appear patent. There is no CT evidence of pulmonary embolism. Apparent linear density in the right lower lobe segmental artery (series 8, image 248) may be artifactual or represent scarring. Mediastinum/Nodes: Right hilar and mediastinal adenopathy noted. The esophagus and the thyroid gland are grossly unremarkable. Lungs/Pleura: There is mild interstitial prominence which may represent edema. There are small bilateral pleural effusions. There is associated partial compressive atelectasis of the lower lobes. Pneumonia is not excluded. Bibasilar linear atelectasis/scarring noted. The central airways are patent. There is no pneumothorax. Upper Abdomen: No acute abnormality. Musculoskeletal: Degenerative changes of the spine. No acute osseous pathology. Review of the MIP images confirms the above findings. IMPRESSION: 1. No CT evidence of pulmonary embolism. 2. Cardiomegaly with evidence of  CHF, small bilateral pleural effusions and mild interstitial edema. Pneumonia is not excluded. Clinical correlation is recommended. 3. Right hilar and mediastinal adenopathy, likely reactive. 4.  Aortic Atherosclerosis (ICD10-I70.0). Electronically Signed   By: Elgie CollardArash  Radparvar M.D.   On: 08/21/2016 01:55   Dg Chest Portable 1 View  Result Date: 08/21/2016 CLINICAL DATA:  83 year old female with palpitations. EXAM: PORTABLE CHEST 1 VIEW COMPARISON:  Chest radiograph dated 03/02/2015 FINDINGS: Left lung base atelectatic changes/ scarring noted. There is no focal consolidation, pleural effusion, or pneumothorax. Top-normal cardiac silhouette. There is atherosclerotic calcification of the aortic arch. Osteopenia with degenerative changes of the spine. Left shoulder arthroplasty. No acute osseous pathology. IMPRESSION: Left lung base atelectasis/scarring. Infiltrate is less  likely. Clinical correlation is recommended. Electronically Signed   By: Elgie Collard M.D.   On: 08/21/2016 00:43    EKG: Independently reviewed. Atrial fibrillation with RVR at 142 bpm  Assessment/Plan Atrial fibrillation with RVR: Acute. Patient found to be in A. fib with RVR with heart rate of 142 on admission. No known previous history of atrial fibrillation in the past.  Chadsvasc score= at least 5. - Admit to stepdown bed - continue Diltiazem drip - Goal potassium 4 and magnesium 2 - Check echocardiogram in a.m. - Consult cardiology in a.m. if warranted  CHF exacerbation: Acute. Likely related to the above. CT angiogram of the chest show signs of CHF with small bilateral pleural effusions and on interstitial edema.BNP elevated at 400  Patient was given 20 mg of Lasix IV while in the emergency department. - Strict ins and outs and daily weights - Reassess for need of continued IV diuresis   S/p total right knee replacement - Physical therapy to eval and treat  Urinary tract infection: Acute.Patient with positive  urinalysis on admission - Follow-up urine culture - Continue Rocephin  Right leg DVT: Diagnosed on 7/3 Following total knee replacement done on 6/20. - continue Xarelto  Hypothyroidism: TSH noted to be 2.58 on admission - Continue levothyroxine  Anemia of chronic disease - Continue to monitor  Essential hypertension:Patient currently not on any antihypertensive medications. - Continue blood  DVT prophylaxis: Xarelto Code Status: Full  Family Communication: Discussed plan of care with the patient and family present at bedside  Disposition Plan: Likely discharge home in 1-2 days  Consults called: None  Admission status: Observation  Clydie Braun MD [facility] Pager [phone]  If 7PM-7AM, please contact night-coverage [email or web address] Password [email or web address]  08/21/2016, 3:29 AM

## 2016-08-21 NOTE — ED Provider Notes (Addendum)
TIME SEEN: 12:24 AM  CHIEF COMPLAINT: palpitations  HPI: Patient is an 83 year old female with history of previous brain aneurysm with intracranial hemorrhage that was not amendable to operative repair, hypertension, hypothyroidism who presents emergency department with palpitations for the past several days. She is not able to tell us exactly when symptoms started. She states that over the past 2-3 days her heart rate has been fast. She has had intermittent chest tightness but none currently. She does feel short of breath. She recently had a right total knee replacement on June 20 with Dr. Eulah Pont. They did find that she had right lower extremity DVTs and started her on Xarelto on July 3. She has never had atrial fibrillation before. No history of pulmonary embolus.  She was previously on Lopressor twice daily but has been off this medication as her family reports that her blood pressure was low and her heart rate was also low.  ROS: See HPI Constitutional: no fever  Eyes: no drainage  ENT: no runny nose   Cardiovascular:   chest pain  Resp:  SOB  GI: no vomiting GU: no dysuria Integumentary: no rash  Allergy: no hives  Musculoskeletal: no leg swelling  Neurological: no slurred speech ROS otherwise negative  PAST MEDICAL HISTORY/PAST SURGICAL HISTORY:  Past Medical History:  Diagnosis Date  . Aneurysm (HCC)    brain  rt side  . Anxiety   . Arthritis   . Chronic kidney disease    stage 3  . Dyspnea   . GERD (gastroesophageal reflux disease)   . Heart murmur    years ago asymp  . History of hiatal hernia   . Hypertension   . Hypothyroidism   . Neuropathy     MEDICATIONS:  Prior to Admission medications   Medication Sig Start Date End Date Taking? Authorizing Provider  aspirin EC 325 MG tablet Take 1 tablet (325 mg total) by mouth daily. 1 tab a day for the next 30 days to prevent blood clots 07/25/16   Cristie Hem, PA-C  carboxymethylcellulose (REFRESH PLUS) 0.5 % SOLN  Place 1-2 drops into both eyes 3 (three) times daily as needed (dry eye).    [provider]  furosemide (LASIX) 20 MG tablet Take 20-40 mg by mouth daily as needed (for fluid retention).     [provider]  HYDROcodone-acetaminophen (NORCO) 5-325 MG tablet Take 1-2 tablets by mouth every 4 (four) hours as needed for moderate pain. 07/25/16   Cristie Hem, PA-C  latanoprost (XALATAN) 0.005 % ophthalmic solution Place 1 drop into both eyes at bedtime.    [provider]  levothyroxine (SYNTHROID, LEVOTHROID) 88 MCG tablet Take 88 mcg by mouth daily before breakfast.    [provider]  metoprolol tartrate (LOPRESSOR) 50 MG tablet Take 25 mg by mouth 2 (two) times daily.    [provider]  NON FORMULARY Take 2 capsules by mouth daily after breakfast. "Rejuveniix" energy and mental alertness     [provider]  NON FORMULARY Take 3 capsules by mouth daily after breakfast. Optimal-V(for Heart,Eyes, Skin & Lungs) Dietary supplement    [provider]  NON FORMULARY Take 2 capsules by mouth daily after breakfast. Optimal-M (for Bones, Nerves & Muscles) Dietary supplement    [provider]  NON FORMULARY Take 2 capsules by mouth at bedtime. Magnical-D (for Bones & Muscles) Dietary supplement    [provider]  ondansetron (ZOFRAN) 4 MG tablet Take 1 tablet (4 mg total)  by mouth every 8 (eight) hours as needed for nausea or vomiting. 07/25/16   Cristie HemStanbery, Mary L, PA-C  ranitidine (ZANTAC) 300 MG tablet Take 300 mg by mouth 2 (two) times daily.    [provider]  sertraline (ZOLOFT) 100 MG tablet Take 50-100 mg by mouth 2 (two) times daily. Take 0.5 tablet (50 MG) by mouth with supper & 1 tablet (100 MG) by mouth after breakfast    [provider]    ALLERGIES:  Allergies  Allergen Reactions  . No Known Allergies     SOCIAL HISTORY:  Social History  Substance Use Topics  . Smoking status: Never  Smoker  . Smokeless tobacco: Never Used  . Alcohol use No    FAMILY HISTORY: No family history on file.  EXAM: BP 118/76 (BP Location: Right Arm)   Pulse (!) 135   Temp 98.1 F (36.7 C) (Oral)   Resp 20   Ht 5\' 5"  (1.651 m)   Wt 82.6 kg (182 lb)   SpO2 98%   BMI 30.29 kg/m  CONSTITUTIONAL: Alert and oriented and responds appropriately to questions. Elderly, in no distress, afebrile HEAD: Normocephalic EYES: Conjunctivae clear, pupils appear equal, EOMI, no conjunctival pallor ENT: normal nose; moist mucous membranes NECK: Supple, no meningismus, no nuchal rigidity, no LAD  CARD: irregularly irregular and tachycardic; S1 and S2 appreciated; no murmurs, no clicks, no rubs, no gallops RESP: Normal chest excursion without splinting or tachypnea; breath sounds clear and equal bilaterally; no wheezes, no rhonchi, no rales, no hypoxia or respiratory distress, speaking full sentences ABD/GI: Normal bowel sounds; non-distended; soft, non-tender, no rebound, no guarding, no peritoneal signs, no hepatosplenomegaly BACK:  The back appears normal and is non-tender to palpation, there is no CVA tenderness EXT: Normal ROM in all joints; non-tender to palpation; no edema; normal capillary refill; no cyanosis, no calf tenderness or swelling, patient has a surgical incision to the right anterior knee with moderate joint effusion with no erythema, warmth or drainage, incision appears clean/dry/intact    SKIN: Normal color for age and race; warm; no rash NEURO: Moves all extremities equally PSYCH: The patient's mood and manner are appropriate. Grooming and personal hygiene are appropriate.  MEDICAL DECISION MAKING: Patient here with new onset atrial fibrillation with intermittent chest pain shortness of breath. She was recently diagnosed with right lower extremity DVTs and started on Xarelto. Given unclear onset of atrial fibrillation I do not feel she is a candidate for cardioversion from the emergency  department. We'll give her IV diltiazem bolus and drip given her heart rate is in the 140s to 160s. She is otherwise hemodynamically stable. Mentating normally. Will obtain labs, chest x-ray. We'll also obtain a CT of her chest to evaluate for pulmonary embolus.  ED PROGRESS: Pt's labs are reassuring with normal electrolytes, normal TSH, negative troponin. Chest x-ray shows possible pulmonary edema versus infiltrate. CT scan shows no PE but does demonstrate pulmonary edema. Less likely infiltrate. She has no leukocytosis, fevers, productive cough. I do not feel this is pneumonia and I do not feel she needs antibiotics at this time. BNP is mildly elevated.will give small dose of IV Lasix. Her heart rate is now on the 110s to 130s still in atrial fibrillation on 5 mg of diltiazem and hour.  I will discuss with medicine for admission. [facility].   2:49 AM Discussed patient's case with hospitalist, Dr. Katrinka BlazingSmith.  I have recommended admission and patient (and family  if present) agree with this plan. Admitting physician will place admission orders.   I reviewed all nursing notes, vitals, pertinent previous records, EKGs, lab and urine results, imaging (as available).  3:20 AM  Pt also appears to have a nitrite positive urinary tract infection. We'll send urine culture and give dose of IV ceftriaxone.   EKG Interpretation  Date/Time:  Monday August 20 2016 23:34:10 EDT Ventricular Rate:  142 PR Interval:    QRS Duration: 84 QT Interval:  304 QTC Calculation: 467 R Axis:   80 Text Interpretation:  Atrial fibrillation with rapid ventricular response Abnormal ECG Confirmed by Alila Sotero, Baxter Hire [phone]) on 08/20/2016 11:54:38 PM        CRITICAL CARE Performed by: Raelyn Number   Total critical care time: 55 minutes  Critical care time was exclusive of separately billable procedures and treating other patients.  Critical care was necessary to treat or prevent  imminent or life-threatening deterioration.  Critical care was time spent personally by me on the following activities: development of treatment plan with patient and/or surrogate as well as nursing, discussions with consultants, evaluation of patient's response to treatment, examination of patient, obtaining history from patient or surrogate, ordering and performing treatments and interventions, ordering and review of laboratory studies, ordering and review of radiographic studies, pulse oximetry and re-evaluation of patient's condition.    Terah Robey, Layla Maw, DO 08/21/16 0249    Gioia Ranes, Layla Maw, DO 08/21/16 [id number]

## 2016-08-21 NOTE — Progress Notes (Signed)
Metoprolol 5 mg Iv given as per order - Pt's Hr slowly went  from 120's to 80 - still at fib & pt still on Cardizem gtt.

## 2016-08-21 NOTE — Evaluation (Signed)
Physical Therapy Evaluation Patient Details Name: [name] MRN: [id number] DOB: May 15, 1927 Today's Date: 08/21/2016   History of Present Illness  Pt adm with afib with rvr. Pt with recent rt tkr on 6/20. PMH includes aneurysm (non-repairable), CKD, HTN, bilat neuropathy, L ankle fracture surgery, L shoulder surgery, and bilat THA.   Clinical Impression  Pt admitted with above diagnosis and presents to PT with functional limitations due to deficits listed below (See PT problem list). Pt needs skilled PT to maximize independence and safety to allow discharge to home with daughter and resume OPPT.      Follow Up Recommendations Outpatient PT (resume)    Equipment Recommendations  None recommended by PT    Recommendations for Other Services       Precautions / Restrictions Precautions Precautions: Fall Restrictions Weight Bearing Restrictions: No      Mobility  Bed Mobility Overal bed mobility: Modified Independent                Transfers Overall transfer level: Needs assistance Equipment used: Rolling walker (2 wheeled);None Transfers: Sit to/from Ray[employer]onStand;Stand Pivot Transfers Sit to Stand: Supervision Stand pivot transfers: Supervision       General transfer comment: Assist for lines and safety  Ambulation/Gait Ambulation/Gait assistance: Min guard Ambulation Distance (Feet): 30 Feet Assistive device: Rolling walker (2 wheeled) Gait Pattern/deviations: Step-through pattern;Decreased step length - left;Decreased stance time - right;Trunk flexed Gait velocity: decr Gait velocity interpretation: <1.8 ft/sec, indicative of risk for recurrent falls General Gait Details: Assist for safety and verbal cues to stand more erect  Stairs            Wheelchair Mobility    Modified Rankin (Stroke Patients Only)       Balance Overall balance assessment: Needs assistance Sitting-balance support: No upper extremity supported;Feet supported Sitting  balance-Leahy Scale: Good     Standing balance support: No upper extremity supported;During functional activity Standing balance-Leahy Scale: Fair                               Pertinent Vitals/Pain Pain Assessment: Faces Faces Pain Scale: Hurts little more Pain Location: Bil LE's from knees to toes Pain Descriptors / Indicators: Burning Pain Intervention(s): Limited activity within patient's tolerance;Monitored during session    Home Living Family/patient expects to be discharged to:: Private residence Living Arrangements: Children Available Help at Discharge: Family;Available 24 hours/day Type of Home: House Home Access: Stairs to enter   Enter[employer] CorporationEntrance Stairs-Number of Steps: 1 Home Layout: One level Home Equipment: Emergency planning/management officerhower seat;Walker - 2 wheels;Cane - single point;Bedside commode;Toilet riser      Prior Function Level of Independence: Needs assistance   Gait / Transfers Assistance Needed: Amb with rolling walker modified independent for household distances since TKR           Hand Dominance   Dominant Hand: Right    Extremity/Trunk Assessment   Upper Extremity Assessment Upper Extremity Assessment: Overall WFL for tasks assessed    Lower Extremity Assessment Lower Extremity Assessment: Generalized weakness;RLE deficits/detail;LLE deficits/detail RLE Deficits / Details: At least 3+/5 for knee. AROM for knee 0-90 RLE Sensation: history of peripheral neuropathy LLE Sensation: history of peripheral neuropathy       Communication   Communication: No difficulties  Cognition Arousal/Alertness: Awake/alert Behavior During Therapy: WFL for tasks assessed/performed Overall Cognitive Status: Within Functional Limits for tasks assessed  General Comments: Pt reports baseline short term memory problems      General Comments      Exercises Total Joint Exercises Short Arc Quad: AROM;Right;10 reps;Supine Heel  Slides: AROM;Right;10 reps;Supine Hip ABduction/ADduction: AROM;Right;10 reps;Supine Straight Leg Raises: AROM;Right;10 reps;Supine Long Arc Quad: AROM;Right;10 reps;Seated Knee Flexion: AAROM;Right;10 reps;Seated Goniometric ROM: 0-90 Other Exercises Other Exercises: Standing knee flex on rt x 10   Assessment/Plan    PT Assessment Patient needs continued PT services  PT Problem List Decreased strength;Decreased range of motion;Decreased activity tolerance;Decreased balance;Decreased mobility       PT Treatment Interventions DME instruction;Gait training;Functional mobility training;Therapeutic activities;Therapeutic exercise;Balance training;Patient/family education    PT Goals (Current goals can be found in the Care Plan section)  Acute Rehab PT Goals Patient Stated Goal: return home PT Goal Formulation: With patient Time For Goal Achievement: 08/28/16 Potential to Achieve Goals: Good Additional Goals Additional Goal #1: Pt will achieve 100 degrees rt knee flexion    Frequency Min 3X/week   Barriers to discharge        Co-evaluation               AM-PAC PT "6 Clicks" Daily Activity  Outcome Measure Difficulty turning over in bed (including adjusting bedclothes, sheets and blankets)?: A Little Difficulty moving from lying on back to sitting on the side of the bed? : A Little Difficulty sitting down on and standing up from a chair with arms (e.g., wheelchair, bedside commode, etc,.)?: A Little Help needed moving to and from a bed to chair (including a wheelchair)?: A Little Help needed walking in hospital room?: A Little Help needed climbing 3-5 steps with a railing? : A Lot 6 Click Score: 17    End of Session   Activity Tolerance: Patient tolerated treatment well Patient left: in bed;with call bell/phone within reach Nurse Communication: Mobility status PT Visit Diagnosis: Muscle weakness (generalized) (M62.81);Difficulty in walking, not elsewhere classified  (R26.2)    Time: 1610-9604 PT Time Calculation (min) (ACUTE ONLY): 27 min   Charges:   PT Evaluation $PT Eval Moderate Complexity: 1 Procedure PT Treatments $Therapeutic Exercise: 8-22 mins   PT G Codes:   PT G-Codes **NOT FOR INPATIENT CLASS** Functional Assessment Tool Used: AM-PAC 6 Clicks Basic Mobility Functional Limitation: Mobility: Walking and moving around Mobility: Walking and Moving Around Current Status (V4098): At least 40 percent but less than 60 percent impaired, limited or restricted Mobility: Walking and Moving Around Goal Status [phone]): At least 20 percent but less than 40 percent impaired, limited or restricted    [facility] PT [phone]   Angelina Ok [facility] 08/21/2016, 4:40 PM

## 2016-08-21 NOTE — Discharge Instructions (Addendum)
Atrial Fibrillation  Atrial fibrillation is a type of irregular or rapid heartbeat (arrhythmia). In atrial fibrillation, the heart quivers continuously in a chaotic pattern. This occurs when parts of the heart receive disorganized signals that make the heart unable to pump blood normally. This can increase the risk for stroke, heart failure, and other heart-related conditions. There are different types of atrial fibrillation, including:  Paroxysmal atrial fibrillation. This type starts suddenly, and it usually stops on its own shortly after it starts.  Persistent atrial fibrillation. This type often lasts longer than a week. It may stop on its own or with treatment.  Long-lasting persistent atrial fibrillation. This type lasts longer than 12 months.  Permanent atrial fibrillation. This type does not go away.  Talk with your health care provider to learn about the type of atrial fibrillation that you have. What are the causes? This condition is caused by some heart-related conditions or procedures, including:  A heart attack.  Coronary artery disease.  Heart failure.  Heart valve conditions.  High blood pressure.  Inflammation of the sac that surrounds the heart (pericarditis).  Heart surgery.  Certain heart rhythm disorders, such as Wolf-Parkinson-White syndrome.  Other causes include:  Pneumonia.  Obstructive sleep apnea.  Blockage of an artery in the lungs (pulmonary embolism, or PE).  Lung cancer.  Chronic lung disease.  Thyroid problems, especially if the thyroid is overactive (hyperthyroidism).  Caffeine.  Excessive alcohol use or illegal drug use.  Use of some medicines, including certain decongestants and diet pills.  Sometimes, the cause cannot be found. What increases the risk? This condition is more likely to develop in:  People who are older in age.  People who smoke.  People who have diabetes mellitus.  People who are overweight  (obese).  Athletes who exercise vigorously.  What are the signs or symptoms? Symptoms of this condition include:  A feeling that your heart is beating rapidly or irregularly.  A feeling of discomfort or pain in your chest.  Shortness of breath.  Sudden light-headedness or weakness.  Getting tired easily during exercise.  In some cases, there are no symptoms. How is this diagnosed? Your health care provider may be able to detect atrial fibrillation when taking your pulse. If detected, this condition may be diagnosed with:  An electrocardiogram (ECG).  A Holter monitor test that records your heartbeat patterns over a 24-hour period.  Transthoracic echocardiogram (TTE) to evaluate how blood flows through your heart.  Transesophageal echocardiogram (TEE) to view more detailed images of your heart.  A stress test.  Imaging tests, such as a CT scan or chest X-ray.  Blood tests.  How is this treated? The main goals of treatment are to prevent blood clots from forming and to keep your heart beating at a normal rate and rhythm. The type of treatment that you receive depends on many factors, such as your underlying medical conditions and how you feel when you are experiencing atrial fibrillation. This condition may be treated with:  Medicine to slow down the heart rate, bring the hearts rhythm back to normal, or prevent clots from forming.  Electrical cardioversion. This is a procedure that resets your hearts rhythm by delivering a controlled, low-energy shock to the heart through your skin.  Different types of ablation, such as catheter ablation, catheter ablation with pacemaker, or surgical ablation. These procedures destroy the heart tissues that send abnormal signals. When the pacemaker is used, it is placed under your skin to help your heart beat  in a regular rhythm.  Follow these instructions at home:  Take over-the counter and prescription medicines only as told by your  health care provider.  If your health care provider prescribed a blood-thinning medicine (anticoagulant), take it exactly as told. Taking too much blood-thinning medicine can cause bleeding. If you do not take enough blood-thinning medicine, you will not have the protection that you need against stroke and other problems.  Do not use tobacco products, including cigarettes, chewing tobacco, and e-cigarettes. If you need help quitting, ask your health care provider.  If you have obstructive sleep apnea, manage your condition as told by your health care provider.  Do not drink alcohol.  Do not drink beverages that contain caffeine, such as coffee, soda, and tea.  Maintain a healthy weight. Do not use diet pills unless your health care provider approves. Diet pills may make heart problems worse.  Follow diet instructions as told by your health care provider.  Exercise regularly as told by your health care provider.  Keep all follow-up visits as told by your health care provider. This is important. How is this prevented?  Avoid drinking beverages that contain caffeine or alcohol.  Avoid certain medicines, especially medicines that are used for breathing problems.  Avoid certain herbs and herbal medicines, such as those that contain ephedra or ginseng.  Do not use illegal drugs, such as cocaine and amphetamines.  Do not smoke.  Manage your high blood pressure. Contact a health care provider if:  You notice a change in the rate, rhythm, or strength of your heartbeat.  You are taking an anticoagulant and you notice increased bruising.  You tire more easily when you exercise or exert yourself. Get help right away if:  You have chest pain, abdominal pain, sweating, or weakness.  You feel nauseous.  You notice blood in your vomit, bowel movement, or urine.  You have shortness of breath.  You suddenly have swollen feet and ankles.  You feel dizzy.  You have sudden weakness or  numbness of the face, arm, or leg, especially on one side of the body.  You have trouble speaking, trouble understanding, or both (aphasia).  Your face or your eyelid droops on one side. These symptoms may represent a serious problem that is an emergency. Do not wait to see if the symptoms will go away. Get medical help right away. Call your local emergency services (911 in the [place] Do not drive yourself to the hospital. This information is not intended to replace advice given to you by your health care provider. Make sure you discuss any questions you have with your health care provider. Document Released: 01/22/2005 Document Revised: 06/01/2015 Document Reviewed: 05/19/2014 Elsevier Interactive Patient Education  2017 [employer].   Information on my medicine - XARELTO (rivaroxaban)  This medication education was reviewed with me or my healthcare representative as part of my discharge preparation.   WHY WAS XARELTO PRESCRIBED FOR YOU? Xarelto was prescribed to treat blood clots that may have been found in the veins of your legs (deep vein thrombosis) or in your lungs (pulmonary embolism) and to reduce the risk of them occurring again.  What do you need to know about Xarelto? The starting dose is one 15 mg tablet taken TWICE daily with food for the FIRST 21 DAYS then on Tuesday July 24th the dose is changed to one 20 mg tablet taken ONCE A DAY with your evening meal.  DO NOT stop taking Xarelto without talking to the  health care provider who prescribed the medication.  Refill your prescription for 20 mg tablets before you run out.  After discharge, you should have regular check-up appointments with your healthcare provider that is prescribing your Xarelto.  In the future your dose may need to be changed if your kidney function changes by a significant amount.  What do you do if you miss a dose? If you are taking Xarelto TWICE DAILY and you miss a dose, take it as soon as you  remember. You may take two 15 mg tablets (total 30 mg) at the same time then resume your regularly scheduled 15 mg twice daily the next day.  If you are taking Xarelto ONCE DAILY and you miss a dose, take it as soon as you remember on the same day then continue your regularly scheduled once daily regimen the next day. Do not take two doses of Xarelto at the same time.   Important Safety Information Xarelto is a blood thinner medicine that can cause bleeding. You should call your healthcare provider right away if you experience any of the following: ? Bleeding from an injury or your nose that does not stop. ? Unusual colored urine (red or dark brown) or unusual colored stools (red or black). ? Unusual bruising for unknown reasons. ? A serious fall or if you hit your head (even if there is no bleeding).  Some medicines may interact with Xarelto and might increase your risk of bleeding while on Xarelto. To help avoid this, consult your healthcare provider or pharmacist prior to using any new prescription or non-prescription medications, including herbals, vitamins, non-steroidal anti-inflammatory drugs (NSAIDs) and supplements.  This website has more information on Xarelto: [email or web address].xarelto.[email or web address].

## 2016-08-21 NOTE — Care Management Note (Addendum)
Case Management Note  Patient Details  Name: [name] MRN: [id number] Date of Birth: 05/06/1927  Subjective/Objective:     From home with daughter, Jari FavreRhonda Atkinson [phone], she also has a daughter [place]) and a daughter Arvilla Meres(Genie) that lives in [place] , they are all good support system for her,she had recent hip replacement in Jan and , recent right knee replacement this past June.  She has had HH services before but could not remember the name of the agency, she has a rollator, bsc, walker, cane and a walk in shower at home, presents with afib with rvr, chf ex, uti, conts on cardizem drip, iv abx, xarelto.  Per pt eval resume oupatient pt.              Action/Plan: NCM will follow for dc needs.   Expected Discharge Date:  08/23/16               Expected Discharge Plan:     In-House Referral:     Discharge planning Services  CM Consult  Post Acute Care Choice:    Choice offered to:     DME Arranged:    DME Agency:     HH Arranged:    HH Agency:     Status of Service:  In process, will continue to follow  If discussed at Long Length of Stay Meetings, dates discussed:    Additional Comments:  Leone Havenaylor, Markeria Goetsch [place], RN 08/21/2016, 3:30 PM

## 2016-08-21 NOTE — Progress Notes (Signed)
HR still At Fib rate greater than 110 increased Cardizem gtt to 10. - see MAR. BP stable, following parameters set in original orders

## 2016-08-22 ENCOUNTER — Encounter (HOSPITAL_COMMUNITY): Payer: Self-pay | Admitting: Cardiology

## 2016-08-22 DIAGNOSIS — I5021 Acute systolic (congestive) heart failure: Secondary | ICD-10-CM

## 2016-08-22 DIAGNOSIS — I5023 Acute on chronic systolic (congestive) heart failure: Secondary | ICD-10-CM

## 2016-08-22 LAB — ECHOCARDIOGRAM COMPLETE
FS: 18 % — AB (ref 28–44)
HEIGHTINCHES: 65 in
IV/PV OW: 0.9
LA diam end sys: 44 mm
LA vol A4C: 62.7 ml
LA vol index: 38 mL/m2
LADIAMINDEX: 2.28 cm/m2
LASIZE: 44 mm
LAVOL: 73.3 mL
LDCA: 2.84 cm2
LV PW d: 11.8 mm — AB (ref 0.6–1.1)
LVOTD: 19 mm
WEIGHTICAEL: 3022.95 [oz_av]

## 2016-08-22 LAB — BASIC METABOLIC PANEL
ANION GAP: 10 (ref 5–15)
BUN: 12 mg/dL (ref 6–20)
CALCIUM: 8.8 mg/dL — AB (ref 8.9–10.3)
CO2: 23 mmol/L (ref 22–32)
Chloride: 105 mmol/L (ref 101–111)
Creatinine, Ser: 0.96 mg/dL (ref 0.44–1.00)
GFR calc Af Amer: 60 mL/min — ABNORMAL LOW (ref >60)
GFR calc non Af Amer: 52 mL/min — ABNORMAL LOW (ref >60)
GLUCOSE: 153 mg/dL — AB (ref 65–99)
Potassium: 3.7 mmol/L (ref 3.5–5.1)
Sodium: 138 mmol/L (ref 135–145)

## 2016-08-22 MED ORDER — METOPROLOL TARTRATE 12.5 MG HALF TABLET
12.5000 mg | ORAL_TABLET | Freq: Two times a day (BID) | ORAL | Status: DC
  Administered 2016-08-22: 12.5 mg via ORAL
  Filled 2016-08-22: qty 1

## 2016-08-22 MED ORDER — METOPROLOL TARTRATE 25 MG PO TABS
25.0000 mg | ORAL_TABLET | Freq: Two times a day (BID) | ORAL | Status: DC
  Administered 2016-08-22 – 2016-08-25 (×6): 25 mg via ORAL
  Filled 2016-08-22 (×6): qty 1

## 2016-08-22 MED ORDER — FUROSEMIDE 10 MG/ML IJ SOLN
20.0000 mg | Freq: Every day | INTRAMUSCULAR | Status: DC
  Administered 2016-08-22 – 2016-08-24 (×3): 20 mg via INTRAVENOUS
  Filled 2016-08-22 (×3): qty 2

## 2016-08-22 NOTE — Consult Note (Signed)
Cardiology Consultation:   Patient ID: [name]; [id number]; 1927-07-21   Admit date: 08/20/2016 Date of Consult: 08/22/2016  Primary Care Provider: Johny Blamer, MD Primary Cardiologist: New- Dr. Rennis Golden   Patient Profile:   [name] is a 83 y.o. female with a hx of Hypothyroidism, hypertension, neuropathy, brain aneurysm, recent total right knee replacement on 07/25/16 and DVT of the right lower extremity for which she was placed on Xarelto who is being seen today for the evaluation of CHF and atrial fibrillation at the request of Dr Rito Ehrlich.  History of Present Illness:   [name] was admitted on 08/20/16 for complaints of heart beating fast for the prior couple of days. Associated symptoms included intermittent chest tightness and mild shortness of breath. Upon presentation to the emergency department the patient was found to be in atrial fibrillation with rapid ventricular response rate of 142 bpm. She was also found to have a UTI and has been started on Rocephin.   She states that she was at her daughter's home recovering from her knee replacement and her daughter noted that her heart rate was elevated on her blood pressure monitor. The patient admits that she had been having palpitations for the last couple of weeks. She's not had any chest discomfort. She's had mild shortness of breath with activity like walking and occasional lightheadedness upon standing. She's had no orthopnea or PND. She has chronic lower extremity edema since childbirth approximately 60 years ago. She thinks that her lower extremity edema has been mildly increased lately, since knee surgery. She does not smoke and only has a rare glass of wine. She has no known family cardiac history. She was evaluated for some cardiac issue by Dr. Myrtis Ser about 30 years ago but the pt does not know the specifics and she was not followed subsequently.   She is currently on Cardizem drip between 10 and 12.5 mg per hour.  Lying almost flat in bed without chest discomfort or dyspnea.   She was found to have a BNP of 408. Electrolytes and kidney function were normal. TSH was 2.580. Troponins have been negative 4. EKG showed atrial fibrillation at 142 bpm with no acute ischemic changes. Chest x-ray showed left lung base atelectasis/scarring. Infiltrate is less likely. Clinical correlation recommended. CT angiography of the chest showed no evidence of pulmonary embolism. Cardiomegaly with evidence of CHF, small bilateral pleural effusions and mild interstitial edema. Pneumonia is not excluded. Clinical correlation recommended. Right hilar and mediastinal adenopathy, likely reactive. Aortic atherosclerosis.  Past Medical History:  Diagnosis Date  . Aneurysm (HCC)    brain  rt side  . Anxiety   . Arthritis   . CHF exacerbation (HCC) 08/21/2016  . Chronic kidney disease    stage 3  . Dyspnea   . GERD (gastroesophageal reflux disease)   . Heart murmur    years ago asymp  . History of hiatal hernia   . Hypertension   . Hypothyroidism   . Neuropathy     Past Surgical History:  Procedure Laterality Date  . ABDOMINAL HYSTERECTOMY     1973  . ANKLE FRACTURE SURGERY     left 2009  . CATARACT EXTRACTION, BILATERAL    . CEREBRAL ANEURYSM REPAIR     2009  . CHOLECYSTECTOMY    . JOINT REPLACEMENT     rt hip 1993  . SHOULDER SURGERY     left 2003  . TOTAL HIP ARTHROPLASTY Left 02/08/2016   Procedure: TOTAL HIP  ARTHROPLASTY ANTERIOR APPROACH;  Surgeon: Loreta Aveaniel F Murphy, MD;  [facility];  Service: Orthopedics;  Laterality: Left;  . TOTAL KNEE ARTHROPLASTY Right 07/25/2016  . TOTAL KNEE ARTHROPLASTY Right 07/25/2016   Procedure: TOTAL KNEE ARTHROPLASTY;  Surgeon: Loreta AveMurphy, Daniel F, MD;  [facility];  Service: Orthopedics;  Laterality: Right;     Inpatient Medications: Scheduled Meds: . furosemide  20 mg Intravenous Daily  . latanoprost  1 drop Both Eyes QHS  . levothyroxine  88 mcg Oral QAC breakfast    . metoprolol tartrate  12.5 mg Oral BID  . rivaroxaban  15 mg Oral BID WC   Followed by  . [START ON 08/28/2016] rivaroxaban  20 mg Oral Q supper  . sertraline  100 mg Oral QPC breakfast  . sertraline  50 mg Oral QPM   Continuous Infusions: . cefTRIAXone (ROCEPHIN)  IV Stopped (08/21/16 2141)  . diltiazem (CARDIZEM) infusion 10 mg/hr (08/22/16 1242)   PRN Meds: acetaminophen **OR** acetaminophen, albuterol, HYDROcodone-acetaminophen, metoprolol tartrate, ondansetron **OR** ondansetron (ZOFRAN) IV  Allergies:    Allergies  Allergen Reactions  . No Known Allergies     Social History:   Social History   Social History  . Marital status: Widowed    Spouse name: N/A  . Number of children: N/A  . Years of education: N/A   Occupational History  . Not on file.   Social History Main Topics  . Smoking status: Never Smoker  . Smokeless tobacco: Never Used  . Alcohol use No     Comment: rare glass of wine  . Drug use: No  . Sexual activity: Not on file   Other Topics Concern  . Not on file   Social History Narrative  . No narrative on file    Family History:   The patient's family history includes Alzheimer's disease in her sister; Hypertension in her daughter, mother, and sister; Other in her brother and father.  ROS:  Please see the history of present illness.  ROS  All other ROS reviewed and negative.     Physical Exam/Data:   Vitals:   08/22/16 1139 08/22/16 1200 08/22/16 1248 08/22/16 1600  BP: (!) 100/51 (!) 107/59 (!) 109/50 (!) 111/44  Pulse: 74 77 (!) 52 83  Resp: 18 (!) 25 (!) 21 18  Temp: 97.8 F (36.6 C)   98.4 F (36.9 C)  TempSrc: Oral   Oral  SpO2: 98% 99% 96% 94%  Weight:      Height:        Intake/Output Summary (Last 24 hours) at 08/22/16 1652 Last data filed at 08/22/16 0400  Gross per 24 hour  Intake           415.54 ml  Output              500 ml  Net           -84.46 ml   Filed Weights   08/20/16 2337 08/21/16 0500 08/22/16 0400   Weight: 182 lb (82.6 kg) 188 lb 15 oz (85.7 kg) 177 lb 14.6 oz (80.7 kg)   Body mass index is 29.61 kg/m.  General:  Well nourished, well developed, in no acute distress HEENT: normal Neck: no JVD Endocrine:  No thryomegaly Vascular: No carotid bruits; FA pulses 2+ bilaterally without bruits  Cardiac:  normal S1, S2; Irregularly irregular rhythm; no murmur  Lungs:  clear to auscultation bilaterally, no wheezing, rhonchi or rales  Abd: soft, nontender, no hepatomegaly  Ext: Trace  lower extremity edema Musculoskeletal:  No deformities, BUE and BLE strength normal and equal Skin: warm and dry  Neuro:  CNs 2-12 intact, no focal abnormalities noted Psych:  Normal affect   EKG:  The EKG was personally reviewed and demonstrates:  atrial fibrillation with rapid ventricular response rate of 142 bpm. Telemetry:  Telemetry was personally reviewed and demonstrates:  Atrial fibrillation with rates in the 80s to 90s and occasionally 100s  Relevant CV Studies:  Echocardiogram 08/21/16 Study Conclusions  - Left ventricle: The cavity size was normal. There was mild   concentric hypertrophy. Systolic function was severely reduced.   The estimated ejection fraction was in the range of 25% to 30%.   Diffuse hypokinesis. The study was not technically sufficient to   allow evaluation of LV diastolic dysfunction due to atrial   fibrillation. - Aortic valve: Transvalvular velocity was within the normal range.   There was no stenosis. - Mitral valve: Calcified annulus. Mildly thickened leaflets .   There was mild regurgitation. - Left atrium: The atrium was mildly dilated. - Right ventricle: The cavity size was mildly dilated. Wall   thickness was normal. Systolic function was moderately reduced. - Right atrium: The atrium was mildly dilated. - Tricuspid valve: There was mild regurgitation. - Pulmonary arteries: Systolic pressure was moderately increased.   PA peak pressure: 46 mm Hg (S). -  Pericardium, extracardiac: There was no pericardial effusion.   There was a large left pleural effusion.  Laboratory Data:  Chemistry  Recent Labs Lab 08/20/16 2338 08/21/16 0401 08/22/16 1039  NA 139 137 138  K 4.6 5.0 3.7  CL 103 103 105  CO2 26 27 23   GLUCOSE 113* 146* 153*  BUN 15 14 12   CREATININE 0.98 1.00 0.96  CALCIUM 9.3 9.0 8.8*  GFRNONAA 50* 49* 52*  GFRAA 58* 57* 60*  ANIONGAP 10 7 10     No results for input(s): PROT, ALBUMIN, AST, ALT, ALKPHOS, BILITOT in the last 168 hours. Hematology  Recent Labs Lab 08/20/16 2338 08/21/16 0401  WBC 6.7 6.4  RBC 3.58* 3.45*  HGB 10.7* 10.3*  HCT 33.8* 32.8*  MCV 94.4 95.1  MCH 29.9 29.9  MCHC 31.7 31.4  RDW 16.3* 16.3*  PLT 384 372   Cardiac Enzymes  Recent Labs Lab 08/21/16 0401 08/21/16 1019 08/21/16 1447  TROPONINI <0.03 <0.03 <0.03     Recent Labs Lab 08/21/16 0006  TROPIPOC 0.03    BNP  Recent Labs Lab 08/20/16 2338  BNP 408.0*    DDimer No results for input(s): DDIMER in the last 168 hours.  Radiology/Studies:  Ct Angio Chest Pe W And/or Wo Contrast  Result Date: 08/21/2016 CLINICAL DATA:  83 year old female with chest pain and shortness of breath. EXAM: CT ANGIOGRAPHY CHEST WITH CONTRAST TECHNIQUE: Multidetector CT imaging of the chest was performed using the standard protocol during bolus administration of intravenous contrast. Multiplanar CT image reconstructions and MIPs were obtained to evaluate the vascular anatomy. CONTRAST:  100 cc Isovue 370 COMPARISON:  Chest radiograph dated 08/19/2016 FINDINGS: Cardiovascular: There is mild cardiomegaly. No pericardial effusion. There is coronary vascular calcification primarily involving the LAD. There is moderate atherosclerotic calcification of the thoracic aorta. The origins of the great vessels of the aortic arch appear patent. There is no CT evidence of pulmonary embolism. Apparent linear density in the right lower lobe segmental artery (series  8, image 248) may be artifactual or represent scarring. Mediastinum/Nodes: Right hilar and mediastinal adenopathy noted. The esophagus and the  thyroid gland are grossly unremarkable. Lungs/Pleura: There is mild interstitial prominence which may represent edema. There are small bilateral pleural effusions. There is associated partial compressive atelectasis of the lower lobes. Pneumonia is not excluded. Bibasilar linear atelectasis/scarring noted. The central airways are patent. There is no pneumothorax. Upper Abdomen: No acute abnormality. Musculoskeletal: Degenerative changes of the spine. No acute osseous pathology. Review of the MIP images confirms the above findings. IMPRESSION: 1. No CT evidence of pulmonary embolism. 2. Cardiomegaly with evidence of CHF, small bilateral pleural effusions and mild interstitial edema. Pneumonia is not excluded. Clinical correlation is recommended. 3. Right hilar and mediastinal adenopathy, likely reactive. 4.  Aortic Atherosclerosis (ICD10-I70.0). Electronically Signed   By: Elgie Collard M.D.   On: 08/21/2016 01:55   Dg Chest Portable 1 View  Result Date: 08/21/2016 CLINICAL DATA:  83 year old female with palpitations. EXAM: PORTABLE CHEST 1 VIEW COMPARISON:  Chest radiograph dated 03/02/2015 FINDINGS: Left lung base atelectatic changes/ scarring noted. There is no focal consolidation, pleural effusion, or pneumothorax. Top-normal cardiac silhouette. There is atherosclerotic calcification of the aortic arch. Osteopenia with degenerative changes of the spine. Left shoulder arthroplasty. No acute osseous pathology. IMPRESSION: Left lung base atelectasis/scarring. Infiltrate is less likely. Clinical correlation is recommended. Electronically Signed   By: Elgie Collard M.D.   On: 08/21/2016 00:43    Assessment and Plan:   Acute systolic heart failure: No previous diagnosis. EF 25-30% with diffuse hypokinesis on echo with mild left and right atrial enlargement,  moderately elevated pulmonary artery pressure with PA peak pressure 46 mmHg, mild MR. BNP on admission was 408. CTA chest showed evidence of CHF. Patient was given 20 mg IV Lasix early yesterday morning and has another dose scheduled for this afternoon. Wt is down 11 lbs. Only had 800 ml UOP. Currently no dyspnea or orthopnea. Has chronic LE edema, mild at present.   Atrial fibrillation: Apparently new onset as patient has no previous history of known atrial fibrillation, pt has been symptomatic for a couple of weeks. Is currently on Cardizem drip between 10 and 12.5 mg per hour as well as metoprolol 12.5 mg orally twice a day with heart rate in fair control. Cardizem is not the best choice for rate control in setting of LV dysfunction. CHA2DS2/VAS Stroke Risk Score is 5 (CHF, HTN, age (2), female). She is already anticoagulated with Xarelto for prior DVT after right knee replacement on 07/25/16. Was started on Xarelto on or about 08/07/16. Could consider DCCV. Pt has not been anticoagulated long enough so would need TEE guided. Will discuss with Dr. Rennis Golden.     Hypothyroidism: Thyroid replacement is in her current regimen. TSH is within normal range     Signed, Berton Bon, NP  08/22/2016 4:52 PM

## 2016-08-22 NOTE — Progress Notes (Signed)
Physical Therapy Treatment Patient Details Name: [name] MRN: [id number] DOB: 25-Mar-1927 Today's Date: 08/22/2016    History of Present Illness Pt adm with afib with rvr. Pt with recent rt tkr on 6/20. PMH includes aneurysm (non-repairable), CKD, HTN, bilat neuropathy, L ankle fracture surgery, L shoulder surgery, and bilat THA.     PT Comments    Pt making good progress.    Follow Up Recommendations  Outpatient PT (resume)     Equipment Recommendations  None recommended by PT    Recommendations for Other Services       Precautions / Restrictions Precautions Precautions: Fall Restrictions Weight Bearing Restrictions: No    Mobility  Bed Mobility Overal bed mobility: Modified Independent             General bed mobility comments: Incr time  Transfers Overall transfer level: Needs assistance Equipment used: Rolling walker (2 wheeled);None Transfers: Sit to/from Ray[employer]onStand;Stand Pivot Transfers Sit to Stand: Supervision Stand pivot transfers: Supervision       General transfer comment: Assist for lines and safety  Ambulation/Gait Ambulation/Gait assistance: Min guard Ambulation Distance (Feet): 100 Feet Assistive device: Rolling walker (2 wheeled) Gait Pattern/deviations: Step-through pattern;Decreased step length - left;Decreased stance time - right;Trunk flexed Gait velocity: decr Gait velocity interpretation: <1.8 ft/sec, indicative of risk for recurrent falls General Gait Details: Assist for safety and verbal cues to stand more erect. HR 100's with activity   Stairs            Wheelchair Mobility    Modified Rankin (Stroke Patients Only)       Balance Overall balance assessment: Needs assistance Sitting-balance support: No upper extremity supported;Feet supported Sitting balance-Leahy Scale: Good     Standing balance support: No upper extremity supported;During functional activity Standing balance-Leahy Scale: Fair                               Cognition Arousal/Alertness: Awake/alert Behavior During Therapy: WFL for tasks assessed/performed Overall Cognitive Status: Within Functional Limits for tasks assessed                                 General Comments: Pt reports baseline short term memory problems      Exercises Total Joint Exercises Long Arc Quad: AROM;Right;10 reps;Seated Knee Flexion: AAROM;Right;10 reps;Seated    General Comments        Pertinent Vitals/Pain Pain Assessment: Faces Faces Pain Scale: Hurts little more Pain Location: rt knee with ROM Pain Descriptors / Indicators: Grimacing Pain Intervention(s): Limited activity within patient's tolerance    Home Living                      Prior Function            PT Goals (current goals can now be found in the care plan section) Acute Rehab PT Goals Patient Stated Goal: return home Progress towards PT goals: Progressing toward goals    Frequency    Min 3X/week      PT Plan Current plan remains appropriate    Co-evaluation              AM-PAC PT "6 Clicks" Daily Activity  Outcome Measure  Difficulty turning over in bed (including adjusting bedclothes, sheets and blankets)?: A Little Difficulty moving from lying on back to sitting on the side of the bed? :  A Little Difficulty sitting down on and standing up from a chair with arms (e.g., wheelchair, bedside commode, etc,.)?: A Little Help needed moving to and from a bed to chair (including a wheelchair)?: A Little Help needed walking in hospital room?: A Little Help needed climbing 3-5 steps with a railing? : A Lot 6 Click Score: 17    End of Session   Activity Tolerance: Patient tolerated treatment well Patient left: with call bell/phone within reach;in chair Nurse Communication: Mobility status PT Visit Diagnosis: Muscle weakness (generalized) (M62.81);Difficulty in walking, not elsewhere classified (R26.2)     Time:  1610-9604 PT Time Calculation (min) (ACUTE ONLY): 22 min  Charges:  $Gait Training: 8-22 mins                    G Codes:       [facility] PT [phone]    Angelina Ok [facility] 08/22/2016, 1:12 PM

## 2016-08-22 NOTE — Progress Notes (Signed)
PROGRESS NOTE  [name] ZOX:[id number] DOB: 1927/07/13 DOA: 08/20/2016 PCP: Johny Blamer, MD  HPI/Recap of past 28 hours: 83 year old female with past medical history recent total knee replacement one month ago with course complicated by DVT several weeks later on chronic anticoagulation. Patient admitted 7/17 with complaints of palpitations and found to be in A. fib with RVR requiring Cardizem drip. Also found to have a UTI. Patient started on IV Rocephin and placed in stepdown unit. Also noted to have an elevated BNP and CT angiogram was negative for PE did note some interstitial edema. Echocardiogram ordered.  Overnight, required some additional doses of IV Lopressor. This morning when sleeping, heart rate stays in A. fib, but below 100. Patient herself is very tired, but otherwise has no complaints. Denies any shortness of breath. Echocardiogram noted ejection fraction of 25-30 percent with inability to comment on diastolic dysfunction secondary to A. fib. Cardiology consulted.   Assessment/Plan: Principal Problem:   Atrial fibrillation with RVR (HCC): On Cardizem drip. Have added by mouth metoprolol and heart rate stays consistently below 100, we'll try to wean off of drip Active Problems:   Hypothyroidism: Continue Synthroid   Status post total knee replacement with complications of right leg DVT: On xarelto    Acute lower UTI: Continue IV Rocephin. Culture positive for Proteus mirabilis, sensitivities pending.  Acute systolic heart failure: Started on Lasix. Noted significant decrease in ejection fraction by echocardiogram. Have consulted cardiology.   Anemia of chronic disease: At baseline stable.   Code Status: Full code as per patient   Family Communication: Left message with daughter   Disposition Plan: Continue inpatient. Keep in stepdown until off Cardizem drip    Consultants:  Cardiology   Procedures:  Echocardiogram done 7/17: Decreased ejection fraction  of 25-30 percent.   Antimicrobials:  IV Rocephin 7/17-present   DVT prophylaxis:  On xarelto   Objective: Vitals:   08/22/16 1104 08/22/16 1139 08/22/16 1200 08/22/16 1248  BP: 103/67 (!) 100/51 (!) 107/59 (!) 109/50  Pulse: (!) 106 74 77 (!) 52  Resp:  18 (!) 25 (!) 21  Temp:  97.8 F (36.6 C)    TempSrc:  Oral    SpO2:  98% 99% 96%  Weight:      Height:        Intake/Output Summary (Last 24 hours) at 08/22/16 1439 Last data filed at 08/22/16 0400  Gross per 24 hour  Intake           415.54 ml  Output              500 ml  Net           -84.46 ml   Filed Weights   08/20/16 2337 08/21/16 0500 08/22/16 0400  Weight: 82.6 kg (182 lb) 85.7 kg (188 lb 15 oz) 80.7 kg (177 lb 14.6 oz)    Exam:   General:  Alert and oriented 3, no acute distress, fatigued  HEENT: Normocephalic make dramatic millimeters extremities are slightly dry  Neck: Supple, minimal JVD   Cardiovascular: Irregular rhythm, borderline tachycardia   Respiratory: Clear auscultation bilaterally   Abdomen: Soft, nontender, nondistended, positive bowel sounds   Musculoskeletal: No clubbing or cyanosis, trace pitting edema bilaterally   Skin: Skin breaks, tears or lesions  Psychiatry: Patient is appropriate, no evidence of psychoses  Neuro: No focal deficits    Data Reviewed: CBC:  Recent Labs Lab 08/20/16 2338 08/21/16 0401  WBC 6.7 6.4  HGB 10.7* 10.3*  HCT 33.8* 32.8*  MCV 94.4 95.1  PLT 384 372   Basic Metabolic Panel:  Recent Labs Lab 08/20/16 2338 08/20/16 2348 08/21/16 0401 08/22/16 1039  NA 139  --  137 138  K 4.6  --  5.0 3.7  CL 103  --  103 105  CO2 26  --  27 23  GLUCOSE 113*  --  146* 153*  BUN 15  --  14 12  CREATININE 0.98  --  1.00 0.96  CALCIUM 9.3  --  9.0 8.8*  MG  --  2.3  --   --    GFR: Estimated Creatinine Clearance: 43.3 mL/min (by C-G formula based on SCr of 0.96 mg/dL). Liver Function Tests: No results for input(s): AST, ALT, ALKPHOS,  BILITOT, PROT, ALBUMIN in the last 168 hours. No results for input(s): LIPASE, AMYLASE in the last 168 hours. No results for input(s): AMMONIA in the last 168 hours. Coagulation Profile: No results for input(s): INR, PROTIME in the last 168 hours. Cardiac Enzymes:  Recent Labs Lab 08/21/16 0401 08/21/16 1019 08/21/16 1447  TROPONINI <0.03 <0.03 <0.03   BNP (last 3 results) No results for input(s): PROBNP in the last 8760 hours. HbA1C: No results for input(s): HGBA1C in the last 72 hours. CBG: No results for input(s): GLUCAP in the last 168 hours. Lipid Profile: No results for input(s): CHOL, HDL, LDLCALC, TRIG, CHOLHDL, LDLDIRECT in the last 72 hours. Thyroid Function Tests:  Recent Labs  08/20/16 2348  TSH 2.580   Anemia Panel: No results for input(s): VITAMINB12, FOLATE, FERRITIN, TIBC, IRON, RETICCTPCT in the last 72 hours. Urine analysis:    Component Value Date/Time   COLORURINE YELLOW 08/21/2016 0024   APPEARANCEUR HAZY (A) 08/21/2016 0024   LABSPEC 1.033 (H) 08/21/2016 0024   PHURINE 7.0 08/21/2016 0024   GLUCOSEU NEGATIVE 08/21/2016 0024   HGBUR NEGATIVE 08/21/2016 0024   BILIRUBINUR NEGATIVE 08/21/2016 0024   KETONESUR NEGATIVE 08/21/2016 0024   PROTEINUR NEGATIVE 08/21/2016 0024   UROBILINOGEN 1.0 02/06/2010 1330   NITRITE POSITIVE (A) 08/21/2016 0024   LEUKOCYTESUR MODERATE (A) 08/21/2016 0024   Sepsis Labs: @LABRCNTIP (procalcitonin:4,lacticidven:4)  ) Recent Results (from the past 240 hour(s))  Urine culture     Status: Abnormal (Preliminary result)   Collection Time: 08/21/16  3:22 AM  Result Value Ref Range Status   Specimen Description URINE, RANDOM  Final   Special Requests NONE  Final   Culture (A)  Final    >=100,000 COLONIES/mL PROTEUS MIRABILIS SUSCEPTIBILITIES TO FOLLOW    Report Status PENDING  Incomplete  MRSA PCR Screening     Status: None   Collection Time: 08/21/16  5:36 AM  Result Value Ref Range Status   MRSA by PCR NEGATIVE  NEGATIVE Final    Comment:        The GeneXpert MRSA Assay (FDA approved for NASAL specimens only), is one component of a comprehensive MRSA colonization surveillance program. It is not intended to diagnose MRSA infection nor to guide or monitor treatment for MRSA infections.       Studies: No results found.  Scheduled Meds: . latanoprost  1 drop Both Eyes QHS  . levothyroxine  88 mcg Oral QAC breakfast  . metoprolol tartrate  12.5 mg Oral BID  . rivaroxaban  15 mg Oral BID WC   Followed by  . [START ON 08/28/2016] rivaroxaban  20 mg Oral Q supper  . sertraline  100 mg Oral QPC breakfast  . sertraline  50 mg Oral  QPM    Continuous Infusions: . cefTRIAXone (ROCEPHIN)  IV Stopped (08/21/16 2141)  . diltiazem (CARDIZEM) infusion 10 mg/hr (08/22/16 1242)     LOS: 1 day     Hollice Espy, MD [facility] Pager [phone]  If 7PM-7AM, please contact night-coverage [email or web address] Password [facility] 08/22/2016, 2:39 PM

## 2016-08-23 DIAGNOSIS — I82401 Acute embolism and thrombosis of unspecified deep veins of right lower extremity: Secondary | ICD-10-CM

## 2016-08-23 DIAGNOSIS — I5022 Chronic systolic (congestive) heart failure: Secondary | ICD-10-CM

## 2016-08-23 DIAGNOSIS — I10 Essential (primary) hypertension: Secondary | ICD-10-CM

## 2016-08-23 DIAGNOSIS — E038 Other specified hypothyroidism: Secondary | ICD-10-CM

## 2016-08-23 DIAGNOSIS — B964 Proteus (mirabilis) (morganii) as the cause of diseases classified elsewhere: Secondary | ICD-10-CM

## 2016-08-23 LAB — BASIC METABOLIC PANEL
Anion gap: 10 (ref 5–15)
BUN: 14 mg/dL (ref 6–20)
CHLORIDE: 104 mmol/L (ref 101–111)
CO2: 27 mmol/L (ref 22–32)
CREATININE: 0.98 mg/dL (ref 0.44–1.00)
Calcium: 8.9 mg/dL (ref 8.9–10.3)
GFR calc Af Amer: 58 mL/min — ABNORMAL LOW (ref >60)
GFR calc non Af Amer: 50 mL/min — ABNORMAL LOW (ref >60)
GLUCOSE: 109 mg/dL — AB (ref 65–99)
Potassium: 4.6 mmol/L (ref 3.5–5.1)
SODIUM: 141 mmol/L (ref 135–145)

## 2016-08-23 LAB — URINE CULTURE: Culture: >=100000 — AB

## 2016-08-23 MED ORDER — DILTIAZEM HCL ER COATED BEADS 240 MG PO CP24
240.0000 mg | ORAL_CAPSULE | Freq: Every day | ORAL | Status: DC
  Administered 2016-08-23: 240 mg via ORAL

## 2016-08-23 MED ORDER — DILTIAZEM HCL ER COATED BEADS 120 MG PO TB24
240.0000 mg | ORAL_TABLET | Freq: Every day | ORAL | Status: DC
  Filled 2016-08-23 (×2): qty 2

## 2016-08-23 MED ORDER — DILTIAZEM HCL ER COATED BEADS 120 MG PO TB24: 180.0000 mg | ORAL_TABLET | Freq: Every day | ORAL | Status: DC

## 2016-08-23 MED ORDER — SODIUM CHLORIDE 0.9 % IV SOLN
INTRAVENOUS | Status: DC
  Administered 2016-08-24: via INTRAVENOUS

## 2016-08-23 NOTE — Progress Notes (Signed)
Pt started on Cardizem po will stop Iv Cardizem gtt in one hour after PO dose - still in At fib rate 80-105 BP stable

## 2016-08-23 NOTE — Progress Notes (Signed)
Rec'd page that family/pt made decision to proceed with TEE/DCCV. Schedule is now full for today so will allow her to eat. [name] is working on scheduling for tomorrow. Dayna Dunn PA-C

## 2016-08-23 NOTE — Progress Notes (Addendum)
DAILY PROGRESS NOTE   Patient Name: [name] Date of Encounter: 08/23/2016  Hospital Problem List   Principal Problem:   Atrial fibrillation with RVR [facility]) Active Problems:   Hypothyroidism   Status post total knee replacement   Right leg DVT ([place])   Acute lower UTI   CHF exacerbation (HCC)   Anemia of chronic disease    Chief Complaint   Feels fine  Subjective   No events overnight - remains in a-fib, but rate controlled. BP normal. Increased b-blocker yesterday. HR still elevated at times - on diltiazem gtts at 7.5 mg/hr. Diuresed 1.5L negative overnight. Creatinine stable. On lasix 20 mg IV daily - continue.  Objective   Vitals:   08/23/16 0818 08/23/16 0900 08/23/16 1000 08/23/16 1113  BP: 120/68 119/62 121/74 (!) 144/67  Pulse: (!) 45 95 (!) 108 72  Resp: 19 (!) 29 (!) 22 (!) 23  Temp: (!) 97.5 F (36.4 C)   97.8 F (36.6 C)  TempSrc: Oral   Oral  SpO2: 97% 98% 97% 100%  Weight:      Height:        Intake/Output Summary (Last 24 hours) at 08/23/16 1316 Last data filed at 08/23/16 1243  Gross per 24 hour  Intake           380.79 ml  Output             1875 ml  Net         -1494.21 ml   Filed Weights   08/21/16 0500 08/22/16 0400 08/23/16 0326  Weight: 188 lb 15 oz (85.7 kg) 177 lb 14.6 oz (80.7 kg) 176 lb 12.9 oz (80.2 kg)    Physical Exam   General appearance: alert and no distress Lungs: clear to auscultation bilaterally Heart: irregularly irregular rhythm Extremities: extremities normal, atraumatic, no cyanosis or edema Neurologic: Grossly normal  Inpatient Medications    Scheduled Meds: . furosemide  20 mg Intravenous Daily  . latanoprost  1 drop Both Eyes QHS  . levothyroxine  88 mcg Oral QAC breakfast  . metoprolol tartrate  25 mg Oral BID  . rivaroxaban  15 mg Oral BID WC   Followed by  . [START ON 08/28/2016] rivaroxaban  20 mg Oral Q supper  . sertraline  100 mg Oral QPC breakfast  . sertraline  50 mg Oral QPM     Continuous Infusions: . cefTRIAXone (ROCEPHIN)  IV Stopped (08/22/16 2330)  . diltiazem (CARDIZEM) infusion 7.5 mg/hr (08/23/16 0137)    PRN Meds: acetaminophen **OR** acetaminophen, albuterol, HYDROcodone-acetaminophen, metoprolol tartrate, ondansetron **OR** ondansetron (ZOFRAN) IV   Labs   Results for orders placed or performed during the hospital encounter of 08/20/16 (from the past 48 hour(s))  Troponin I     Status: None   Collection Time: 08/21/16  2:47 PM  Result Value Ref Range   Troponin I <0.03 <0.03 ng/mL  Basic metabolic panel     Status: Abnormal   Collection Time: 08/22/16 10:39 AM  Result Value Ref Range   Sodium 138 135 - 145 mmol/L   Potassium 3.7 3.5 - 5.1 mmol/L   Chloride 105 101 - 111 mmol/L   CO2 23 22 - 32 mmol/L   Glucose, Bld 153 (H) 65 - 99 mg/dL   BUN 12 6 - 20 mg/dL   Creatinine, Ser 0.96 0.44 - 1.00 mg/dL   Calcium 8.8 (L) 8.9 - 10.3 mg/dL   GFR calc non Af Amer 52 (L) >60 mL/min  GFR calc Af Amer 60 (L) >60 mL/min    Comment: (NOTE) The eGFR has been calculated using the CKD EPI equation. This calculation has not been validated in all clinical situations. eGFR's persistently <60 mL/min signify possible Chronic Kidney Disease.    Anion gap 10 5 - 15  Basic metabolic panel     Status: Abnormal   Collection Time: 08/23/16  3:35 AM  Result Value Ref Range   Sodium 141 135 - 145 mmol/L   Potassium 4.6 3.5 - 5.1 mmol/L    Comment: DELTA CHECK NOTED   Chloride 104 101 - 111 mmol/L   CO2 27 22 - 32 mmol/L   Glucose, Bld 109 (H) 65 - 99 mg/dL   BUN 14 6 - 20 mg/dL   Creatinine, Ser 0.98 0.44 - 1.00 mg/dL   Calcium 8.9 8.9 - 10.3 mg/dL   GFR calc non Af Amer 50 (L) >60 mL/min   GFR calc Af Amer 58 (L) >60 mL/min    Comment: (NOTE) The eGFR has been calculated using the CKD EPI equation. This calculation has not been validated in all clinical situations. eGFR's persistently <60 mL/min signify possible Chronic Kidney Disease.    Anion  gap 10 5 - 15    ECG   N/A  Telemetry   A-fib with alternating flutter- Personally Reviewed  Radiology    No results found.  Cardiac Studies   LV EF: 25% -   30%  ------------------------------------------------------------------- Indications:      Atrial fibrillation - 427.31.  ------------------------------------------------------------------- History:   Risk factors:  Hypertension.  ------------------------------------------------------------------- Study Conclusions  - Left ventricle: The cavity size was normal. There was mild   concentric hypertrophy. Systolic function was severely reduced.   The estimated ejection fraction was in the range of 25% to 30%.   Diffuse hypokinesis. The study was not technically sufficient to   allow evaluation of LV diastolic dysfunction due to atrial   fibrillation. - Aortic valve: Transvalvular velocity was within the normal range.   There was no stenosis. - Mitral valve: Calcified annulus. Mildly thickened leaflets .   There was mild regurgitation. - Left atrium: The atrium was mildly dilated. - Right ventricle: The cavity size was mildly dilated. Wall   thickness was normal. Systolic function was moderately reduced. - Right atrium: The atrium was mildly dilated. - Tricuspid valve: There was mild regurgitation. - Pulmonary arteries: Systolic pressure was moderately increased.   PA peak pressure: 46 mm Hg (S). - Pericardium, extracardiac: There was no pericardial effusion.   There was a large left pleural effusion.  Assessment   1. Principal Problem: 2.   Atrial fibrillation with RVR ([place]) 3. Active Problems: 4.   Hypothyroidism 5.   Status post total knee replacement 6.   Right leg DVT ([place]) 7.   Acute lower UTI 8.   CHF exacerbation ([place]) 9.   Anemia of chronic disease 10.   Plan   1. Patient and family have decided to pursue TEE/Cardioversion. We have arranged for the procedure tomorrow. Will transition from IV  to po dilt today. Overlap IV for 1 hour after po dose given. Keep NPO p MN for TEE/DCCV tomorrow at 9 am with Dr. Acie Fredrickson.  Time Spent Directly with Patient:  I have spent a total of 15 minutes with the patient reviewing hospital notes, telemetry, EKGs, labs and examining the patient as well as establishing an assessment and plan that was discussed personally with the patient. > 50% of time was  spent in direct patient care.  Length of Stay:  LOS: 2 days   Pixie Casino, MD, [place]  Attending Cardiologist  Direct Dial: [phone]  Fax: [phone]  Website:  [email or web address] 08/23/2016, 1:16 PM

## 2016-08-23 NOTE — Progress Notes (Signed)
PROGRESS NOTE    [name]  UJW:[id number] DOB: [birth year] DOA: 08/20/2016 PCP: Johny Blamer, MD   Brief Narrative:  83 y.o. WF PMHx Anxiety, HTN, Hypothyroidism, Neuropathy, Brain aneurysm, Right leg DVT; CKD stage III   Presents with complaints of her heart beating fast for the last couple of days. Associated symptoms include intermittent chest tightness and mild shortness of breath. Denies any change in weight, cough, headache, bleeding, fever, or chills. Patient had just undergone total right knee replacement on 6/20 by Dr. Eulah Pont. She had presented with complaints of right leg swelling on 7/3 and was found to have a DVT of the right lower extremity for which she was placed on Xarelto. Patient was previously on metoprolol for blood pressure, but this was discontinued after patient was noted to have low blood pressure readings. Patient denies any previous history of irregular heartbeat in the past.  ED Course: Upon admission into the emergency department patient was seen to be in A. fib with RVR with heart rate of 142 bpm. Labs revealed hemoglobin 10.7, BNP 408, and troponin <0.03. Urinalysis showed signs of possible infection CT angiogram of the chest showed signs of CHF with interstitial edema. Patient was started on a Cardizem drip, given 1 dose of Rocephin, 324 mg of aspirin, and 20 mg of Lasix IV   Subjective: 7/19  A/O 4, positive short-term memory loss (chronic), negative CP, negative SOB, positive funny feeling in chest, negative N/V. Does not recall if she was on Xarelto right TKA prior to DVT.    Assessment & Plan:   Principal Problem:   Atrial fibrillation with RVR (HCC) Active Problems:   Hypothyroidism   Status post total knee replacement   Right leg DVT (HCC)   Acute lower UTI   CHF exacerbation (HCC)   Anemia of chronic disease   Atrial fibrillation with RVR: Acute.(Chadsvasc score= at least 5.) - Patient found to be in A. fib with RVR with heart rate of  142 on admission. No known previous history of atrial fibrillation in the past.   - Cardizem 240 mg daily -Lasix 20 mg daily -Metoprolol 25 mg BID -Xarelto -TEE with cardioversion scheduled for 7/20  Chronic Systolic CHF   -Echocardiogram; shows systolic CHF and pulmonary hypertension see results below   Strict in and out since admission -1 L Daily weight Filed Weights   08/21/16 0500 08/22/16 0400 08/23/16 0326  Weight: 188 lb 15 oz (85.7 kg) 177 lb 14.6 oz (80.7 kg) 176 lb 12.9 oz (80.2 kg)    Essential hypertension -See atrial fibrillation   S/p total right knee replacement - Physical therapy to eval and treat  Urinary tract infection:positive PROTEUS MIRABILIS  - Acute.Patient with positive urinalysis on admission - Follow-up urine culture - Continue Rocephin for now.  Right leg DVT:  Diagnosed on 7/3 Following total knee replacement done on 6/20. - continue Xarelto  Hypothyroidism:  -TSH noted to be 2.58 on admission - Continue levothyroxine 88 g daily  Anemia of chronic disease - Continue to monitor       DVT prophylaxis: Xarelto Code Status: Full Family Communication: None Disposition Plan: TBD   Consultants:  Cardiology Dr. Zoila Shutter    Procedures/Significant Events:  7/17 CT angiogram chest PE protocol:-Negative PE.-Cardiomegaly with evidence CHF -Small bilateral pleural effusion/mild interstitial edema -Right hilar and mediastinal adenopathy, likely reactive 7/17 Echocardiogram; Left ventricle: mild concentric hypertrophy.-LVEF= 25% to 30%. Diffuse hypokinesis.  -- Pulmonary arteries:  PA peak pressure: 46 mm Hg (S).  VENTILATOR SETTINGS: None   Cultures 7/17 urine positive PROTEUS MIRABILIS   7/17 MRSA by PCR negative    Antimicrobials: Anti-infectives    Start     Stop   08/21/16 2200  cefTRIAXone (ROCEPHIN) 1 g in dextrose 5 % 50 mL IVPB         08/21/16 0330  cefTRIAXone (ROCEPHIN) 1 g in dextrose 5 % 50 mL IVPB       08/21/16 0531       Devices None   LINES / TUBES:  None    Continuous Infusions: . cefTRIAXone (ROCEPHIN)  IV Stopped (08/22/16 2330)  . diltiazem (CARDIZEM) infusion 7.5 mg/hr (08/23/16 0137)     Objective: Vitals:   08/23/16 0000 08/23/16 0326 08/23/16 0800 08/23/16 0818  BP: 98/60 (!) 110/53 125/62 120/68  Pulse: 70 84 73 (!) 45  Resp: 19 17 18 19   Temp:  97.6 F (36.4 C)  (!) 97.5 F (36.4 C)  TempSrc:  Oral  Oral  SpO2: 96% 95% 96% 97%  Weight:  176 lb 12.9 oz (80.2 kg)    Height:  5\' 5"  (1.651 m)      Intake/Output Summary (Last 24 hours) at 08/23/16 0909 Last data filed at 08/23/16 [id number]  Gross per 24 hour  Intake           800.79 ml  Output             1450 ml  Net          -649.21 ml   Filed Weights   08/21/16 0500 08/22/16 0400 08/23/16 0326  Weight: 188 lb 15 oz (85.7 kg) 177 lb 14.6 oz (80.7 kg) 176 lb 12.9 oz (80.2 kg)    Examination:  General: A/O 4, No acute respiratory distress Eyes: negative scleral hemorrhage, negative anisocoria, negative icterus ENT: Negative Runny nose, negative gingival bleeding, Neck:  Negative scars, masses, torticollis, lymphadenopathy, JVD Lungs: Clear to auscultation bilaterally without wheezes or crackles Cardiovascular: Irregular rhythm and rate, without murmur gallop or rub normal S1 and S2 Abdomen: negative abdominal pain, nondistended, positive soft, bowel sounds, no rebound, no ascites, no appreciable mass Extremities: No significant cyanosis, clubbing. Bilateral lower extremity edema 1+Rt>>Lt, positive pain to palpation right calf and posterior knee Skin: Negative rashes, lesions, ulcers Psychiatric:  Negative depression, negative anxiety, negative fatigue, negative mania  Central nervous system:  Cranial nerves II through XII intact, tongue/uvula midline, all extremities muscle strength 5/5, sensation intact throughout,  negative dysarthria, negative expressive aphasia, negative receptive aphasia.  .      Data Reviewed: Care during the described time interval was provided by me .  I have reviewed this patient's available data, including medical history, events of note, physical examination, and all test results as part of my evaluation. I have personally reviewed and interpreted all radiology studies.  CBC:  Recent Labs Lab 08/20/16 2338 08/21/16 0401  WBC 6.7 6.4  HGB 10.7* 10.3*  HCT 33.8* 32.8*  MCV 94.4 95.1  PLT 384 372   Basic Metabolic Panel:  Recent Labs Lab 08/20/16 2338 08/20/16 2348 08/21/16 0401 08/22/16 1039 08/23/16 0335  NA 139  --  137 138 141  K 4.6  --  5.0 3.7 4.6  CL 103  --  103 105 104  CO2 26  --  27 23 27   GLUCOSE 113*  --  146* 153* 109*  BUN 15  --  14 12 14   CREATININE 0.98  --  1.00 0.96 0.98  CALCIUM 9.3  --  9.0 8.8* 8.9  MG  --  2.3  --   --   --    GFR: Estimated Creatinine Clearance: 42.3 mL/min (by C-G formula based on SCr of 0.98 mg/dL). Liver Function Tests: No results for input(s): AST, ALT, ALKPHOS, BILITOT, PROT, ALBUMIN in the last 168 hours. No results for input(s): LIPASE, AMYLASE in the last 168 hours. No results for input(s): AMMONIA in the last 168 hours. Coagulation Profile: No results for input(s): INR, PROTIME in the last 168 hours. Cardiac Enzymes:  Recent Labs Lab 08/21/16 0401 08/21/16 1019 08/21/16 1447  TROPONINI <0.03 <0.03 <0.03   BNP (last 3 results) No results for input(s): PROBNP in the last 8760 hours. HbA1C: No results for input(s): HGBA1C in the last 72 hours. CBG: No results for input(s): GLUCAP in the last 168 hours. Lipid Profile: No results for input(s): CHOL, HDL, LDLCALC, TRIG, CHOLHDL, LDLDIRECT in the last 72 hours. Thyroid Function Tests:  Recent Labs  08/20/16 2348  TSH 2.580   Anemia Panel: No results for input(s): VITAMINB12, FOLATE, FERRITIN, TIBC, IRON, RETICCTPCT in the last 72 hours. Urine analysis:    Component Value Date/Time   COLORURINE YELLOW 08/21/2016 0024    APPEARANCEUR HAZY (A) 08/21/2016 0024   LABSPEC 1.033 (H) 08/21/2016 0024   PHURINE 7.0 08/21/2016 0024   GLUCOSEU NEGATIVE 08/21/2016 0024   HGBUR NEGATIVE 08/21/2016 0024   BILIRUBINUR NEGATIVE 08/21/2016 0024   KETONESUR NEGATIVE 08/21/2016 0024   PROTEINUR NEGATIVE 08/21/2016 0024   UROBILINOGEN 1.0 02/06/2010 1330   NITRITE POSITIVE (A) 08/21/2016 0024   LEUKOCYTESUR MODERATE (A) 08/21/2016 0024   Sepsis Labs: @LABRCNTIP (procalcitonin:4,lacticidven:4)  ) Recent Results (from the past 240 hour(s))  Urine culture     Status: Abnormal (Preliminary result)   Collection Time: 08/21/16  3:22 AM  Result Value Ref Range Status   Specimen Description URINE, RANDOM  Final   Special Requests NONE  Final   Culture (A)  Final    >=100,000 COLONIES/mL PROTEUS MIRABILIS SUSCEPTIBILITIES TO FOLLOW    Report Status PENDING  Incomplete  MRSA PCR Screening     Status: None   Collection Time: 08/21/16  5:36 AM  Result Value Ref Range Status   MRSA by PCR NEGATIVE NEGATIVE Final    Comment:        The GeneXpert MRSA Assay (FDA approved for NASAL specimens only), is one component of a comprehensive MRSA colonization surveillance program. It is not intended to diagnose MRSA infection nor to guide or monitor treatment for MRSA infections.          Radiology Studies: No results found.      Scheduled Meds: . furosemide  20 mg Intravenous Daily  . latanoprost  1 drop Both Eyes QHS  . levothyroxine  88 mcg Oral QAC breakfast  . metoprolol tartrate  25 mg Oral BID  . rivaroxaban  15 mg Oral BID WC   Followed by  . [START ON 08/28/2016] rivaroxaban  20 mg Oral Q supper  . sertraline  100 mg Oral QPC breakfast  . sertraline  50 mg Oral QPM   Continuous Infusions: . cefTRIAXone (ROCEPHIN)  IV Stopped (08/22/16 2330)  . diltiazem (CARDIZEM) infusion 7.5 mg/hr (08/23/16 0137)     LOS: 2 days    Time spent: 40 minutes    Humna Moorehouse, Roselind Messier, MD [facility] Pager  [phone]   If 7PM-7AM, please contact night-coverage [email or web address] Password [email or web address] 08/23/2016, 9:09 AM

## 2016-08-23 NOTE — Progress Notes (Signed)
Physical Therapy Treatment Patient Details Name: [name] MRN: [id number] DOB: [birth year] Today's Date: 08/23/2016    History of Present Illness Pt adm with afib with rvr. Pt with recent rt tkr on 6/20. PMH includes aneurysm (non-repairable), CKD, HTN, bilat neuropathy, L ankle fracture surgery, L shoulder surgery, and bilat THA.     PT Comments    Pt making good progress with mobility.   Follow Up Recommendations  Outpatient PT (resume)     Equipment Recommendations  None recommended by PT    Recommendations for Other Services       Precautions / Restrictions Precautions Precautions: Fall Restrictions Weight Bearing Restrictions: No    Mobility  Bed Mobility Overal bed mobility: Modified Independent             General bed mobility comments: Incr time  Transfers Overall transfer level: Modified independent Equipment used: Rolling walker (2 wheeled);None Transfers: Sit to/from [employer] to Stand: Modified independent (Device/Increase time) Stand pivot transfers: Modified independent (Device/Increase time)       General transfer comment: Pivots bed to bsc without assistive device  Ambulation/Gait Ambulation/Gait assistance: Supervision Ambulation Distance (Feet): 125 Feet Assistive device: Rolling walker (2 wheeled) Gait Pattern/deviations: Step-through pattern;Decreased step length - left;Decreased stance time - right;Trunk flexed Gait velocity: decr Gait velocity interpretation: <1.8 ft/sec, indicative of risk for recurrent falls General Gait Details: Assist for safety and verbal cues to stand more erect. HR 120's with activity   Stairs            Wheelchair Mobility    Modified Rankin (Stroke Patients Only)       Balance Overall balance assessment: Needs assistance Sitting-balance support: No upper extremity supported;Feet supported Sitting balance-Leahy Scale: Good     Standing balance support: No upper  extremity supported;During functional activity Standing balance-Leahy Scale: Fair                              Cognition Arousal/Alertness: Awake/alert Behavior During Therapy: WFL for tasks assessed/performed Overall Cognitive Status: Within Functional Limits for tasks assessed                                 General Comments: Pt reports baseline short term memory problems      Exercises Total Joint Exercises Long Arc Quad: AROM;Right;10 reps;Seated Knee Flexion: AAROM;Right;10 reps;Seated Goniometric ROM: 0-95    General Comments        Pertinent Vitals/Pain Pain Assessment: Faces Faces Pain Scale: Hurts little more Pain Location: bil lower legs Pain Descriptors / Indicators: Burning Pain Intervention(s): Limited activity within patient's tolerance;Monitored during session;Ice applied    Home Living                      Prior Function            PT Goals (current goals can now be found in the care plan section) Progress towards PT goals: Progressing toward goals    Frequency    Min 3X/week      PT Plan Current plan remains appropriate    Co-evaluation              AM-PAC PT "6 Clicks" Daily Activity  Outcome Measure  Difficulty turning over in bed (including adjusting bedclothes, sheets and blankets)?: A Little Difficulty moving from lying on back to sitting on the side  of the bed? : A Little Difficulty sitting down on and standing up from a chair with arms (e.g., wheelchair, bedside commode, etc,.)?: A Little Help needed moving to and from a bed to chair (including a wheelchair)?: None Help needed walking in hospital room?: A Little Help needed climbing 3-5 steps with a railing? : A Lot 6 Click Score: 18    End of Session   Activity Tolerance: Patient tolerated treatment well Patient left: with call bell/phone within reach;in chair Nurse Communication: Mobility status PT Visit Diagnosis: Muscle weakness  (generalized) (M62.81);Difficulty in walking, not elsewhere classified (R26.2)     Time: [id number] PT Time Calculation (min) (ACUTE ONLY): 16 min  Charges:  $Gait Training: 8-22 mins                    G Codes:       [facility] Keshonda Monsour PT [phone]    Angelina OkCary W [email or web address] 08/23/2016, 5:08 PM

## 2016-08-24 ENCOUNTER — Inpatient Hospital Stay (HOSPITAL_COMMUNITY): Payer: Medicare Other | Admitting: Certified Registered"

## 2016-08-24 ENCOUNTER — Encounter (HOSPITAL_COMMUNITY)
Admission: EM | Disposition: A | Discharge status: Home / Self Care | Payer: Self-pay | Source: Home / Self Care | Attending: Internal Medicine

## 2016-08-24 ENCOUNTER — Inpatient Hospital Stay (HOSPITAL_COMMUNITY): Payer: Medicare Other

## 2016-08-24 ENCOUNTER — Encounter (HOSPITAL_COMMUNITY): Payer: Self-pay

## 2016-08-24 DIAGNOSIS — I340 Nonrheumatic mitral (valve) insufficiency: Secondary | ICD-10-CM

## 2016-08-24 DIAGNOSIS — I4891 Unspecified atrial fibrillation: Principal | ICD-10-CM

## 2016-08-24 HISTORY — PX: CARDIOVERSION: SHX1299

## 2016-08-24 HISTORY — PX: TEE WITHOUT CARDIOVERSION: SHX5443

## 2016-08-24 SURGERY — CARDIOVERSION
Anesthesia: Monitor Anesthesia Care

## 2016-08-24 MED ORDER — PROPOFOL 500 MG/50ML IV EMUL
INTRAVENOUS | Status: DC | PRN
  Administered 2016-08-24: 100 ug/kg/min via INTRAVENOUS

## 2016-08-24 MED ORDER — LOSARTAN POTASSIUM 50 MG PO TABS
25.0000 mg | ORAL_TABLET | Freq: Every day | ORAL | Status: DC
  Administered 2016-08-25: 25 mg via ORAL
  Filled 2016-08-24: qty 1

## 2016-08-24 MED ORDER — BISACODYL 10 MG RE SUPP: 10.0000 mg | Freq: Every day | RECTAL | Status: DC | PRN

## 2016-08-24 MED ORDER — SENNOSIDES-DOCUSATE SODIUM 8.6-50 MG PO TABS
1.0000 | ORAL_TABLET | Freq: Two times a day (BID) | ORAL | Status: DC
  Administered 2016-08-25: 1 via ORAL
  Filled 2016-08-24: qty 1

## 2016-08-24 MED ORDER — LACTATED RINGERS IV SOLN
INTRAVENOUS | Status: DC
  Administered 2016-08-24: 09:00:00 via INTRAVENOUS

## 2016-08-24 MED ORDER — PROPOFOL 10 MG/ML IV BOLUS
INTRAVENOUS | Status: DC | PRN
  Administered 2016-08-24 (×3): 20 mg via INTRAVENOUS

## 2016-08-24 MED ORDER — BUTAMBEN-TETRACAINE-BENZOCAINE 2-2-14 % EX AERO
INHALATION_SPRAY | CUTANEOUS | Status: DC | PRN
  Administered 2016-08-24: 2 via TOPICAL

## 2016-08-24 MED ORDER — SODIUM CHLORIDE 0.9% FLUSH
3.0000 mL | Freq: Two times a day (BID) | INTRAVENOUS | Status: DC
  Administered 2016-08-24: 3 mL via INTRAVENOUS

## 2016-08-24 MED ORDER — SODIUM CHLORIDE 0.9% FLUSH: 3.0000 mL | INTRAVENOUS | Status: DC | PRN

## 2016-08-24 MED ORDER — SODIUM CHLORIDE 0.9 % IV SOLN: 250.0000 mL | INTRAVENOUS | Status: DC

## 2016-08-24 NOTE — Interval H&P Note (Signed)
History and Physical Interval Note:  08/24/2016 8:15 AM  [name]  has presented today for surgery, with the diagnosis of afib  The various methods of treatment have been discussed with the patient and family. After consideration of risks, benefits and other options for treatment, the patient has consented to  Procedure(s): CARDIOVERSION (N/A) TRANSESOPHAGEAL ECHOCARDIOGRAM (TEE) (N/A) as a surgical intervention .  The patient's history has been reviewed, patient examined, no change in status, stable for surgery.  I have reviewed the patient's chart and labs.  Questions were answered to the patient's satisfaction.     Kristeen MissPhilip Takyah Ciaramitaro

## 2016-08-24 NOTE — Care Management Important Message (Signed)
Important Message  Patient Details  Name: [name] MRN: [id number] Date of Birth: Jun 23, 1927   Medicare Important Message Given:  Yes    Kyla BalzarineShealy, Treyana Sturgell Abena 08/24/2016, 10:22 AM

## 2016-08-24 NOTE — Progress Notes (Signed)
Scheduled f/u appt per MD request, appt will be placed in AVS. Ronie Spiesayna Whitleigh Garramone PA-C

## 2016-08-24 NOTE — Transfer of Care (Signed)
Immediate Anesthesia Transfer of Care Note  Patient: [name]  Procedure(s) Performed: Procedure(s): CARDIOVERSION (N/A) TRANSESOPHAGEAL ECHOCARDIOGRAM (TEE) (N/A)  Patient Location: Endoscopy Unit  Anesthesia Type:General  Level of Consciousness: drowsy and patient cooperative  Airway & Oxygen Therapy: Patient Spontanous Breathing and Patient connected to nasal cannula oxygen  Post-op Assessment: Report given to RN, Post -op Vital signs reviewed and stable and Patient moving all extremities  Post vital signs: Reviewed  Last Vitals:  Vitals:   08/24/16 0812 08/24/16 0917  BP: 120/60   Pulse: (!) 104 63  Resp: (!) 31 17  Temp: 36.7 C 36.6 C    Last Pain:  Vitals:   08/24/16 0917  TempSrc: Oral  PainSc:          Complications: No apparent anesthesia complications

## 2016-08-24 NOTE — CV Procedure (Signed)
    Transesophageal Echocardiogram Note  [name] [id number] 19-Nov-1927  Procedure: Transesophageal Echocardiogram Indications: Atrial Fib  Procedure Details Consent: Obtained Time Out: Verified patient identification, verified procedure, site/side was marked, verified correct patient position, special equipment/implants available, Radiology Safety Procedures followed,  medications/allergies/relevent history reviewed, required imaging and test results available.  Performed  Medications:  During this procedure the patient is administered Propofol drip 126 mg total for TEE and cardioversion by anesthesia .  Left Ventrical:  Normal LV function  Right Ventrical:   Mild RV dysfunction   Mitral Valve: mild MR  Aortic Valve: mildly thickened   Tricuspid Valve: trace TR   Pulmonic Valve: mild PI   Left Atrium/ Left atrial appendage: no thrombi   Atrial septum: + PFO with left to right shunting visualized by color doppler   Aorta: mild calcified plaque    Complications: No apparent complications Patient did tolerate procedure well.   We proceeded with cardioversion at this time      Cardioversion Note  [name] [id number] 19-Nov-1927  Procedure: DC Cardioversion Indications: atrial fib   Procedure Details Consent: Obtained Time Out: Verified patient identification, verified procedure, site/side was marked, verified correct patient position, special equipment/implants available, Radiology Safety Procedures followed,  medications/allergies/relevent history reviewed, required imaging and test results available.  Performed  The patient has been on adequate anticoagulation.  The patient received IV Propofol ( total of 126 mg for TEE and cardioversion - see above )  for sedation.  Synchronous cardioversion was performed at 120  joules.  The cardioversion was successful     Complications: No apparent complications Patient did tolerate procedure  well.   Vesta MixerPhilip J. Lincy Belles, Montez HagemanJr., MD, [facility] 08/24/2016, 9:13 AM

## 2016-08-24 NOTE — Anesthesia Preprocedure Evaluation (Addendum)
Anesthesia Evaluation  Patient identified by MRN, date of birth, ID band Patient awake    Reviewed: Allergy & Precautions, H&P , NPO status , Patient's Chart, lab work & pertinent test results, reviewed documented beta blocker date and time   Airway Mallampati: II  TM Distance: >3 FB Neck ROM: full    Dental no notable dental hx.    Pulmonary neg pulmonary ROS,    Pulmonary exam normal breath sounds clear to auscultation       Cardiovascular hypertension, Pt. on medications and Pt. on home beta blockers  Rhythm:regular Rate:Normal     Neuro/Psych Anxiety Cerebral aneurysm    GI/Hepatic hiatal hernia, GERD  ,  Endo/Other  Hypothyroidism   Renal/GU Renal InsufficiencyRenal disease     Musculoskeletal  (+) Arthritis ,   Abdominal   Peds  Hematology   Anesthesia Other Findings LV EF: 25% -   30%  Reproductive/Obstetrics                            Anesthesia Physical  Anesthesia Plan  ASA: III  Anesthesia Plan: MAC   Post-op Pain Management:  Regional for Post-op pain   Induction: Intravenous  PONV Risk Score and Plan: 2 and Ondansetron, Treatment may vary due to age or medical condition and Midazolam  Airway Management Planned: Simple Face Mask  Additional Equipment:   Intra-op Plan:   Post-operative Plan:   Informed Consent: I have reviewed the patients History and Physical, chart, labs and discussed the procedure including the risks, benefits and alternatives for the proposed anesthesia with the patient or authorized representative who has indicated his/her understanding and acceptance.   Dental Advisory Given  Plan Discussed with: CRNA, Anesthesiologist and Surgeon  Anesthesia Plan Comments:         Anesthesia Quick Evaluation

## 2016-08-24 NOTE — Progress Notes (Signed)
[facility] TEAM 1 - Stepdown/ICU TEAM  [name]  ZOX:[id number] DOB: Oct 20, 1927 DOA: 08/20/2016 PCP: Johny Blamer, MD    Brief Narrative:  83yo F w/ Hx Anxiety, HTN, Hypothyroidism, Neuropathy, Brain aneurysm, Right legDVT, and CKD stage III who presented with complaints of her heart beating fast for a couple of days. Patient underwent total right knee replacement on 6/20 by Dr. Eulah Pont. She presented with right leg swelling on 7/3 and was found to have a DVT of the right lower extremity for which she was placed on Xarelto.   Upon admission to the ED the patient was seen to be in A. fib with heart rate of 142.   Subjective: The patient is resting comfortably in the bed status post cardioversion.  She denies any current chest pain shortness of breath nausea or vomiting.  Assessment & Plan:  Acute atrial fibrillation with RVR Chadsvasc is 5 - cardiology has been following - status post successful TEE cardioversion 7/20 - presently remains in normal sinus rhythm - to continue Xarelto - continue metoprolol - off Cardizem  Acute systolic CHF TEE at time of cardioversion reportedly showed near normalization of EF - ARB initiated  HTN Currently well-controlled  Proteus mirabilis UTI Continue antibiotic therapy  Right lower extremity DVT status post right total knee replacement Continue Xarelto  Hypothyroidism TSH 2.58 this admission - continue current Synthroid dose without change  Anemia of chronic disease  DVT prophylaxis: Xarelto  Code Status: FULL CODE Family Communication: no family present at time of exam  Disposition Plan: ambulate - follow HR - probable d/c home 7/21  Consultants:  [facility]   Procedures: 7/20 TEE DCCV 7/17 TTE - EF 25-30% - diffuse hypokinesis   Antimicrobials:  Rocephin 7/16 >  Objective: Blood pressure (!) 117/55, pulse 63, temperature (!) 97.5 F (36.4 C), temperature source Oral, resp. rate 18, height 5\' 5"  (1.651 m), weight  78.1 kg (172 lb 2.9 oz), SpO2 98 %.  Intake/Output Summary (Last 24 hours) at 08/24/16 1350 Last data filed at 08/24/16 1239  Gross per 24 hour  Intake           423.17 ml  Output             1175 ml  Net          -751.83 ml   Filed Weights   08/23/16 0326 08/24/16 0345 08/24/16 0817  Weight: 80.2 kg (176 lb 12.9 oz) 78.1 kg (172 lb 2.9 oz) 78.1 kg (172 lb 2.9 oz)    Examination: General: No acute respiratory distress Lungs: Clear to auscultation bilaterally without wheezes or crackles Cardiovascular: Regular rate and rhythm without murmur gallop or rub normal S1 and S2 Abdomen: Nontender, nondistended, soft, bowel sounds positive, no rebound, no ascites, no appreciable mass Extremities: No significant cyanosis, clubbing, or edema bilateral lower extremities  CBC:  Recent Labs Lab 08/20/16 2338 08/21/16 0401  WBC 6.7 6.4  HGB 10.7* 10.3*  HCT 33.8* 32.8*  MCV 94.4 95.1  PLT 384 372   Basic Metabolic Panel:  Recent Labs Lab 08/20/16 2338 08/20/16 2348 08/21/16 0401 08/22/16 1039 08/23/16 0335  NA 139  --  137 138 141  K 4.6  --  5.0 3.7 4.6  CL 103  --  103 105 104  CO2 26  --  27 23 27   GLUCOSE 113*  --  146* 153* 109*  BUN 15  --  14 12 14   CREATININE 0.98  --  1.00 0.96 0.98  CALCIUM 9.3  --  9.0 8.8* 8.9  MG  --  2.3  --   --   --    GFR: Estimated Creatinine Clearance: 41.8 mL/min (by C-G formula based on SCr of 0.98 mg/dL).  Cardiac Enzymes:  Recent Labs Lab 08/21/16 0401 08/21/16 1019 08/21/16 1447  TROPONINI <0.03 <0.03 <0.03    HbA1C: Hgb A1c MFr Bld  Date/Time Value Ref Range Status  05/21/2007 11:05 AM   Final   5.6 (NOTE)   The ADA recommends the following therapeutic goals for glycemic   control related to Hgb A1C measurement:   Goal of Therapy:   < 7.0% Hgb A1C   Action Suggested:  > 8.0% Hgb A1C   Ref:  Diabetes Care, 22, Suppl. 1, 1999    Recent Results (from the past 240 hour(s))  Urine culture     Status: Abnormal    Collection Time: 08/21/16  3:22 AM  Result Value Ref Range Status   Specimen Description URINE, RANDOM  Final   Special Requests NONE  Final   Culture >=100,000 COLONIES/mL PROTEUS MIRABILIS (A)  Final   Report Status 08/23/2016 FINAL  Final   Organism ID, Bacteria PROTEUS MIRABILIS (A)  Final      Susceptibility   Proteus mirabilis - MIC*    AMPICILLIN <=2 SENSITIVE Sensitive     CEFAZOLIN <=4 SENSITIVE Sensitive     CEFTRIAXONE <=1 SENSITIVE Sensitive     CIPROFLOXACIN <=0.25 SENSITIVE Sensitive     GENTAMICIN <=1 SENSITIVE Sensitive     IMIPENEM 2 SENSITIVE Sensitive     NITROFURANTOIN 128 RESISTANT Resistant     TRIMETH/SULFA <=20 SENSITIVE Sensitive     AMPICILLIN/SULBACTAM <=2 SENSITIVE Sensitive     PIP/TAZO <=4 SENSITIVE Sensitive     * >=100,000 COLONIES/mL PROTEUS MIRABILIS  MRSA PCR Screening     Status: None   Collection Time: 08/21/16  5:36 AM  Result Value Ref Range Status   MRSA by PCR NEGATIVE NEGATIVE Final    Comment:        The GeneXpert MRSA Assay (FDA approved for NASAL specimens only), is one component of a comprehensive MRSA colonization surveillance program. It is not intended to diagnose MRSA infection nor to guide or monitor treatment for MRSA infections.      Scheduled Meds: . furosemide  20 mg Intravenous Daily  . latanoprost  1 drop Both Eyes QHS  . levothyroxine  88 mcg Oral QAC breakfast  . [START ON 08/25/2016] losartan  25 mg Oral Daily  . metoprolol tartrate  25 mg Oral BID  . rivaroxaban  15 mg Oral BID WC   Followed by  . [START ON 08/28/2016] rivaroxaban  20 mg Oral Q supper  . sertraline  100 mg Oral QPC breakfast  . sertraline  50 mg Oral QPM  . sodium chloride flush  3 mL Intravenous Q12H    LOS: 3 days   Lonia BloodJeffrey T. McClung, MD [facility] Office  [phone] Pager - Text Page per Loretha StaplerAmion as per below:  On-Call/Text Page:      [email or web address]      password [email or web address]  If 7PM-7AM, please contact  night-coverage [email or web address] Password [email or web address] 08/24/2016, 1:50 PM

## 2016-08-24 NOTE — Anesthesia Postprocedure Evaluation (Signed)
Anesthesia Post Note  Patient: [name]  Procedure(s) Performed: Procedure(s) (LRB): CARDIOVERSION (N/A) TRANSESOPHAGEAL ECHOCARDIOGRAM (TEE) (N/A)     Patient location during evaluation: PACU Anesthesia Type: MAC Level of consciousness: awake and alert Pain management: pain level controlled Vital Signs Assessment: post-procedure vital signs reviewed and stable Respiratory status: spontaneous breathing, nonlabored ventilation, respiratory function stable and patient connected to nasal cannula oxygen Cardiovascular status: stable and blood pressure returned to baseline Anesthetic complications: no    Last Vitals:  Vitals:   08/24/16 0935 08/24/16 1125  BP: (!) 100/41 136/68  Pulse: 64 76  Resp: 15 (!) 21  Temp:  (!) 36.4 C    Last Pain:  Vitals:   08/24/16 1125  TempSrc: Oral  PainSc:                  Riccardo Dubin

## 2016-08-24 NOTE — Progress Notes (Signed)
DAILY PROGRESS NOTE   Patient Name: [name] Date of Encounter: 08/24/2016  Hospital Problem List   Principal Problem:   Atrial fibrillation with RVR [facility]) Active Problems:   Hypothyroidism   Status post total knee replacement   Right leg DVT ([place])   Acute lower UTI   CHF exacerbation (HCC)   Anemia of chronic disease    Chief Complaint   Feeling better  Subjective   Successfully cardioverted this morning - d/w Dr. Acie Fredrickson, he has noted that LV function has improved. On Xarelto.  Objective   Vitals:   08/24/16 0917 08/24/16 0920 08/24/16 0925 08/24/16 0935  BP: (!) 89/36 (!) 93/33 (!) 92/39 (!) 100/41  Pulse: 63 61 62 64  Resp: '17 16 17 15  '$ Temp: 97.9 F (36.6 C)     TempSrc: Oral     SpO2: 98% 96% 96% 97%  Weight:      Height:        Intake/Output Summary (Last 24 hours) at 08/24/16 0949 Last data filed at 08/24/16 0913  Gross per 24 hour  Intake           800.17 ml  Output             1425 ml  Net          -624.83 ml   Filed Weights   08/23/16 0326 08/24/16 0345 08/24/16 0817  Weight: 176 lb 12.9 oz (80.2 kg) 172 lb 2.9 oz (78.1 kg) 172 lb 2.9 oz (78.1 kg)    Physical Exam   General appearance: alert and no distress Lungs: clear to auscultation bilaterally Heart: regular rate and rhythm Extremities: extremities normal, atraumatic, no cyanosis or edema Neurologic: Grossly normal  Inpatient Medications    Scheduled Meds: . diltiazem  240 mg Oral Daily  . furosemide  20 mg Intravenous Daily  . latanoprost  1 drop Both Eyes QHS  . levothyroxine  88 mcg Oral QAC breakfast  . metoprolol tartrate  25 mg Oral BID  . rivaroxaban  15 mg Oral BID WC   Followed by  . [START ON 08/28/2016] rivaroxaban  20 mg Oral Q supper  . sertraline  100 mg Oral QPC breakfast  . sertraline  50 mg Oral QPM  . sodium chloride flush  3 mL Intravenous Q12H    Continuous Infusions: . sodium chloride    . cefTRIAXone (ROCEPHIN)  IV Stopped (08/23/16 2306)     PRN Meds: acetaminophen **OR** acetaminophen, albuterol, HYDROcodone-acetaminophen, metoprolol tartrate, ondansetron **OR** ondansetron (ZOFRAN) IV, sodium chloride flush   Labs   Results for orders placed or performed during the hospital encounter of 08/20/16 (from the past 48 hour(s))  Basic metabolic panel     Status: Abnormal   Collection Time: 08/22/16 10:39 AM  Result Value Ref Range   Sodium 138 135 - 145 mmol/L   Potassium 3.7 3.5 - 5.1 mmol/L   Chloride 105 101 - 111 mmol/L   CO2 23 22 - 32 mmol/L   Glucose, Bld 153 (H) 65 - 99 mg/dL   BUN 12 6 - 20 mg/dL   Creatinine, Ser 0.96 0.44 - 1.00 mg/dL   Calcium 8.8 (L) 8.9 - 10.3 mg/dL   GFR calc non Af Amer 52 (L) >60 mL/min   GFR calc Af Amer 60 (L) >60 mL/min    Comment: (NOTE) The eGFR has been calculated using the CKD EPI equation. This calculation has not been validated in all clinical situations. eGFR's persistently <60 mL/min  signify possible Chronic Kidney Disease.    Anion gap 10 5 - 15  Basic metabolic panel     Status: Abnormal   Collection Time: 08/23/16  3:35 AM  Result Value Ref Range   Sodium 141 135 - 145 mmol/L   Potassium 4.6 3.5 - 5.1 mmol/L    Comment: DELTA CHECK NOTED   Chloride 104 101 - 111 mmol/L   CO2 27 22 - 32 mmol/L   Glucose, Bld 109 (H) 65 - 99 mg/dL   BUN 14 6 - 20 mg/dL   Creatinine, Ser 0.98 0.44 - 1.00 mg/dL   Calcium 8.9 8.9 - 10.3 mg/dL   GFR calc non Af Amer 50 (L) >60 mL/min   GFR calc Af Amer 58 (L) >60 mL/min    Comment: (NOTE) The eGFR has been calculated using the CKD EPI equation. This calculation has not been validated in all clinical situations. eGFR's persistently <60 mL/min signify possible Chronic Kidney Disease.    Anion gap 10 5 - 15    ECG   N/A  Telemetry   Now shows sinus rhythm - Personally Reviewed  Radiology    No results found.  Cardiac Studies   Transesophageal Echocardiogram Note  KAYLEENA EKE [id number] 09/08/1927  Procedure:  Transesophageal Echocardiogram Indications: Atrial Fib  Procedure Details Consent: Obtained Time Out: Verified patient identification, verified procedure, site/side was marked, verified correct patient position, special equipment/implants available, Radiology Safety Procedures followed,  medications/allergies/relevent history reviewed, required imaging and test results available.  Performed  Medications:  During this procedure the patient is administered Propofol drip 126 mg total for TEE and cardioversion by anesthesia .  Left Ventrical:  Normal LV function  Right Ventrical:   Mild RV dysfunction   Mitral Valve: mild MR  Aortic Valve: mildly thickened   Tricuspid Valve: trace TR   Pulmonic Valve: mild PI   Left Atrium/ Left atrial appendage: no thrombi   Atrial septum: + PFO with left to right shunting visualized by color doppler   Aorta: mild calcified plaque    Complications: No apparent complications Patient did tolerate procedure well.   We proceeded with cardioversion at this time    Cardioversion Note  RAVYNN HOGATE [id number] 1928/01/04  Procedure: DC Cardioversion Indications: atrial fib   Procedure Details Consent: Obtained Time Out: Verified patient identification, verified procedure, site/side was marked, verified correct patient position, special equipment/implants available, Radiology Safety Procedures followed,  medications/allergies/relevent history reviewed, required imaging and test results available.  Performed  The patient has been on adequate anticoagulation.  The patient received IV Propofol ( total of 126 mg for TEE and cardioversion - see above )  for sedation.  Synchronous cardioversion was performed at 120  joules.  The cardioversion was successful    Complications: No apparent complications Patient did tolerate procedure well.   Thayer Headings, Brooke Bonito., MD, [facility] 08/24/2016, 9:13 AM  Assessment    1. Principal Problem: 2.   Atrial fibrillation with RVR ([place]) 3. Active Problems: 4.   Hypothyroidism 5.   Status post total knee replacement 6.   Right leg DVT ([place]) 7.   Acute lower UTI 8.   CHF exacerbation ([place]) 9.   Anemia of chronic disease 10.   Plan   1. Successful DCCV this morning to NSR - LVEF appears to have near-normalized - there is mild RV dysfunction. HR now in the 60's. BP normal this morning. Will d/c diltiazem, continue metoprolol and add losartan 25 mg daily for CHF  benefit tomorrow. May be appropriate for d/c later today per medicine service.  No further suggestions at this time. Can follow-up with me after discharge in about 1 month. Thanks for allowing me to participate in her care. Cardiology will sign-off.  Time Spent Directly with Patient:  I have spent a total of 15 minutes with the patient reviewing hospital notes, telemetry, EKGs, labs and examining the patient as well as establishing an assessment and plan that was discussed personally with the patient. > 50% of time was spent in direct patient care.  Length of Stay:   LOS: 3 days   Pixie Casino, MD, [place]  Attending Cardiologist  Direct Dial: [phone]  Fax: [phone]  Website:  [email or web address] Hassing 08/24/2016, 9:49 AM

## 2016-08-24 NOTE — H&P (View-Only) (Signed)
 DAILY PROGRESS NOTE   Patient Name: [name] Date of Encounter: 08/23/2016  Hospital Problem List   Principal Problem:   Atrial fibrillation with RVR (HCC) Active Problems:   Hypothyroidism   Status post total knee replacement   Right leg DVT (HCC)   Acute lower UTI   CHF exacerbation (HCC)   Anemia of chronic disease    Chief Complaint   Feels fine  Subjective   No events overnight - remains in a-fib, but rate controlled. BP normal. Increased b-blocker yesterday. HR still elevated at times - on diltiazem gtts at 7.5 mg/hr. Diuresed 1.5L negative overnight. Creatinine stable. On lasix 20 mg IV daily - continue.  Objective   Vitals:   08/23/16 0818 08/23/16 0900 08/23/16 1000 08/23/16 1113  BP: 120/68 119/62 121/74 (!) 144/67  Pulse: (!) 45 95 (!) 108 72  Resp: 19 (!) 29 (!) 22 (!) 23  Temp: (!) 97.5 F (36.4 C)   97.8 F (36.6 C)  TempSrc: Oral   Oral  SpO2: 97% 98% 97% 100%  Weight:      Height:        Intake/Output Summary (Last 24 hours) at 08/23/16 1316 Last data filed at 08/23/16 1243  Gross per 24 hour  Intake           380.79 ml  Output             1875 ml  Net         -1494.21 ml   Filed Weights   08/21/16 0500 08/22/16 0400 08/23/16 0326  Weight: 188 lb 15 oz (85.7 kg) 177 lb 14.6 oz (80.7 kg) 176 lb 12.9 oz (80.2 kg)    Physical Exam   General appearance: alert and no distress Lungs: clear to auscultation bilaterally Heart: irregularly irregular rhythm Extremities: extremities normal, atraumatic, no cyanosis or edema Neurologic: Grossly normal  Inpatient Medications    Scheduled Meds: . furosemide  20 mg Intravenous Daily  . latanoprost  1 drop Both Eyes QHS  . levothyroxine  88 mcg Oral QAC breakfast  . metoprolol tartrate  25 mg Oral BID  . rivaroxaban  15 mg Oral BID WC   Followed by  . [START ON 08/28/2016] rivaroxaban  20 mg Oral Q supper  . sertraline  100 mg Oral QPC breakfast  . sertraline  50 mg Oral QPM     Continuous Infusions: . cefTRIAXone (ROCEPHIN)  IV Stopped (08/22/16 2330)  . diltiazem (CARDIZEM) infusion 7.5 mg/hr (08/23/16 0137)    PRN Meds: acetaminophen **OR** acetaminophen, albuterol, HYDROcodone-acetaminophen, metoprolol tartrate, ondansetron **OR** ondansetron (ZOFRAN) IV   Labs   Results for orders placed or performed during the hospital encounter of 08/20/16 (from the past 48 hour(s))  Troponin I     Status: None   Collection Time: 08/21/16  2:47 PM  Result Value Ref Range   Troponin I <0.03 <0.03 ng/mL  Basic metabolic panel     Status: Abnormal   Collection Time: 08/22/16 10:39 AM  Result Value Ref Range   Sodium 138 135 - 145 mmol/L   Potassium 3.7 3.5 - 5.1 mmol/L   Chloride 105 101 - 111 mmol/L   CO2 23 22 - 32 mmol/L   Glucose, Bld 153 (H) 65 - 99 mg/dL   BUN 12 6 - 20 mg/dL   Creatinine, Ser 0.96 0.44 - 1.00 mg/dL   Calcium 8.8 (L) 8.9 - 10.3 mg/dL   GFR calc non Af Amer 52 (L) >60 mL/min     GFR calc Af Amer 60 (L) >60 mL/min    Comment: (NOTE) The eGFR has been calculated using the CKD EPI equation. This calculation has not been validated in all clinical situations. eGFR's persistently <60 mL/min signify possible Chronic Kidney Disease.    Anion gap 10 5 - 15  Basic metabolic panel     Status: Abnormal   Collection Time: 08/23/16  3:35 AM  Result Value Ref Range   Sodium 141 135 - 145 mmol/L   Potassium 4.6 3.5 - 5.1 mmol/L    Comment: DELTA CHECK NOTED   Chloride 104 101 - 111 mmol/L   CO2 27 22 - 32 mmol/L   Glucose, Bld 109 (H) 65 - 99 mg/dL   BUN 14 6 - 20 mg/dL   Creatinine, Ser 0.98 0.44 - 1.00 mg/dL   Calcium 8.9 8.9 - 10.3 mg/dL   GFR calc non Af Amer 50 (L) >60 mL/min   GFR calc Af Amer 58 (L) >60 mL/min    Comment: (NOTE) The eGFR has been calculated using the CKD EPI equation. This calculation has not been validated in all clinical situations. eGFR's persistently <60 mL/min signify possible Chronic Kidney Disease.    Anion  gap 10 5 - 15    ECG   N/A  Telemetry   A-fib with alternating flutter- Personally Reviewed  Radiology    No results found.  Cardiac Studies   LV EF: 25% -   30%  ------------------------------------------------------------------- Indications:      Atrial fibrillation - 427.31.  ------------------------------------------------------------------- History:   Risk factors:  Hypertension.  ------------------------------------------------------------------- Study Conclusions  - Left ventricle: The cavity size was normal. There was mild   concentric hypertrophy. Systolic function was severely reduced.   The estimated ejection fraction was in the range of 25% to 30%.   Diffuse hypokinesis. The study was not technically sufficient to   allow evaluation of LV diastolic dysfunction due to atrial   fibrillation. - Aortic valve: Transvalvular velocity was within the normal range.   There was no stenosis. - Mitral valve: Calcified annulus. Mildly thickened leaflets .   There was mild regurgitation. - Left atrium: The atrium was mildly dilated. - Right ventricle: The cavity size was mildly dilated. Wall   thickness was normal. Systolic function was moderately reduced. - Right atrium: The atrium was mildly dilated. - Tricuspid valve: There was mild regurgitation. - Pulmonary arteries: Systolic pressure was moderately increased.   PA peak pressure: 46 mm Hg (S). - Pericardium, extracardiac: There was no pericardial effusion.   There was a large left pleural effusion.  Assessment   1. Principal Problem: 2.   Atrial fibrillation with RVR (HCC) 3. Active Problems: 4.   Hypothyroidism 5.   Status post total knee replacement 6.   Right leg DVT (HCC) 7.   Acute lower UTI 8.   CHF exacerbation (HCC) 9.   Anemia of chronic disease 10.   Plan   1. Patient and family have decided to pursue TEE/Cardioversion. We have arranged for the procedure tomorrow. Will transition from IV  to po dilt today. Overlap IV for 1 hour after po dose given. Keep NPO p MN for TEE/DCCV tomorrow at 9 am with Dr. Nahser.  Time Spent Directly with Patient:  I have spent a total of 15 minutes with the patient reviewing hospital notes, telemetry, EKGs, labs and examining the patient as well as establishing an assessment and plan that was discussed personally with the patient. > 50% of time was   spent in direct patient care.  Length of Stay:  LOS: 2 days   Kenneth C. Hilty, MD, FACC  [place]  [facility]  Attending Cardiologist  Direct Dial: [phone]  Fax: [phone]  Website:  [email or web address]  Kenneth C Hilty 08/23/2016, 1:16 PM   

## 2016-08-24 NOTE — Progress Notes (Signed)
  Echocardiogram Echocardiogram Transesophageal has been performed.  Connie SavoyCasey N Kirstina [name] 08/24/2016, 9:51 AM

## 2016-08-25 MED ORDER — RIVAROXABAN 15 MG PO TABS: 15.0000 mg | ORAL_TABLET | Freq: Two times a day (BID) | ORAL | 0 refills | Status: DC

## 2016-08-25 MED ORDER — LOSARTAN POTASSIUM 25 MG PO TABS: 25.0000 mg | ORAL_TABLET | Freq: Every day | ORAL | 0 refills | Status: DC

## 2016-08-25 MED ORDER — RIVAROXABAN 20 MG PO TABS: 20.0000 mg | ORAL_TABLET | Freq: Every day | ORAL | 0 refills | Status: DC

## 2016-08-25 MED ORDER — METOPROLOL TARTRATE 25 MG PO TABS: 25.0000 mg | ORAL_TABLET | Freq: Two times a day (BID) | ORAL | 0 refills | Status: DC

## 2016-08-25 NOTE — Discharge Summary (Addendum)
DISCHARGE SUMMARY  [name]  MR#: [id number]  DOB:01-28-1928  Date of Admission: 08/20/2016 Date of Discharge: 08/25/2016  Attending Physician:Machael Raine T  Patient's WUJ:[id number]:Harris, Chrissie NoaWilliam, MD  Consults:  [facility]   Disposition: D/C home   Follow-up Appts: Follow-up Information    Hilty, Lisette AbuKenneth C, MD Follow up.   Specialty:  Cardiology Why:  [facility] [place] office - 09/28/16 at 2:15pm. Arrive 15 minutes prior to appointment to check in. Contact information: [address] [place] [address] [phone]        Johny BlamerHarris, William, MD Follow up in 7 day(s).   Specialty:  Family Medicine Contact information: [phone] [address] [place] [address] [phone]           Tests Needing Follow-up: -assessment of heart rate and rhythm -routine monitoring of patient on anticoagulation  -pt will need refills of her BB, ARB, and Xarelto  Discharge Diagnoses: Acute atrial fibrillation with RVR Acute systolic CHF - rate related - essentially resolved at d/c  HTN Proteus mirabilis UTI Right lower extremity DVT status post right total knee replacement Hypothyroidism Anemia of chronic disease  Initial presentation: 83yo Fw/ HxAnxiety, HTN, Hypothyroidism, Neuropathy, Brain aneurysm, Right legDVT, and CKD stage III who presented with complaints of her heart beating fast for a couple of days. Patient underwent total right knee replacement on 6/20 by Dr. Eulah PontMurphy. She presented with right leg swelling on 7/3 and was found to have a DVT of the right lower extremity for which she was placed on Xarelto.   Upon admission to the ED the patient was seen to be in A. fib with heart rate of 142.  Hospital Course:  Acute atrial fibrillation with RVR Chadsvasc is 5 - Cardiology has followed during her stay - status post successful TEE cardioversion 7/20 - remains in normal sinus rhythm at time of d/c - to continue Xarelto -  continue metoprolol - off Cardizem  Acute systolic CHF TEE at time of cardioversion reportedly showed near normalization of EF - ARB initiated - no volume overload at time of d/c   HTN well-controlled at time of d/c   Proteus mirabilis UTI Has completed 5 days of abx tx - no further tx to be prescribed   Right lower extremity DVT status post right total knee replacement Continue Xarelto  Hypothyroidism TSH 2.58 this admission - continue current Synthroid dose without change  Anemia of chronic disease Hgb stable during this admit   Allergies as of 08/25/2016      Reactions   No Known Allergies       Medication List    STOP taking these medications   aspirin EC 325 MG tablet   ondansetron 4 MG tablet Commonly known as:  ZOFRAN     TAKE these medications   HYDROcodone-acetaminophen 5-325 MG tablet Commonly known as:  NORCO Take 1-2 tablets by mouth every 4 (four) hours as needed for moderate pain. What changed:  how much to take   latanoprost 0.005 % ophthalmic solution Commonly known as:  XALATAN Place 1 drop into both eyes at bedtime.   levothyroxine 88 MCG tablet Commonly known as:  SYNTHROID, LEVOTHROID Take 88 mcg by mouth daily before breakfast.   losartan 25 MG tablet Commonly known as:  COZAAR Take 1 tablet (25 mg total) by mouth daily.   metoprolol tartrate 25 MG tablet Commonly known as:  LOPRESSOR Take 1 tablet (25 mg total) by mouth 2 (two) times daily.  Rivaroxaban 15 MG Tabs tablet Commonly known as:  XARELTO Take 1 tablet (15 mg total) by mouth 2 (two) times daily with a meal. What changed:  Another medication with the same name was added. Make sure you understand how and when to take each.   rivaroxaban 20 MG Tabs tablet Commonly known as:  XARELTO Take 1 tablet (20 mg total) by mouth daily with supper. Start taking on:  08/28/2016 What changed:  You were already taking a medication with the same name, and this prescription was added.  Make sure you understand how and when to take each.   sertraline 100 MG tablet Commonly known as:  ZOLOFT Take 50-100 mg by mouth See admin instructions. Take 1 tablet after breakfast and take 1/2 tablet after supper       Day of Discharge BP 123/62 (BP Location: Left Arm)   Pulse 61   Temp (!) 97.4 F (36.3 C) (Oral)   Resp 16   Ht 5\' 5"  (1.651 m)   Wt 78.1 kg (172 lb 2.9 oz)   SpO2 97%   BMI 28.65 kg/m   Physical Exam: General: No acute respiratory distress Lungs: Clear to auscultation bilaterally without wheezes or crackles Cardiovascular: Regular rate and rhythm without murmur gallop or rub normal S1 and S2 Abdomen: Nontender, nondistended, soft, bowel sounds positive, no rebound, no ascites, no appreciable mass Extremities: No significant cyanosis, clubbing, or edema bilateral lower extremities  Basic Metabolic Panel:  Recent Labs Lab 08/20/16 2338 08/20/16 2348 08/21/16 0401 08/22/16 1039 08/23/16 0335  NA 139  --  137 138 141  K 4.6  --  5.0 3.7 4.6  CL 103  --  103 105 104  CO2 26  --  27 23 27   GLUCOSE 113*  --  146* 153* 109*  BUN 15  --  14 12 14   CREATININE 0.98  --  1.00 0.96 0.98  CALCIUM 9.3  --  9.0 8.8* 8.9  MG  --  2.3  --   --   --     CBC:  Recent Labs Lab 08/20/16 2338 08/21/16 0401  WBC 6.7 6.4  HGB 10.7* 10.3*  HCT 33.8* 32.8*  MCV 94.4 95.1  PLT 384 372    Cardiac Enzymes:  Recent Labs Lab 08/21/16 0401 08/21/16 1019 08/21/16 1447  TROPONINI <0.03 <0.03 <0.03    Recent Results (from the past 240 hour(s))  Urine culture     Status: Abnormal   Collection Time: 08/21/16  3:22 AM  Result Value Ref Range Status   Specimen Description URINE, RANDOM  Final   Special Requests NONE  Final   Culture >=100,000 COLONIES/mL PROTEUS MIRABILIS (A)  Final   Report Status 08/23/2016 FINAL  Final   Organism ID, Bacteria PROTEUS MIRABILIS (A)  Final      Susceptibility   Proteus mirabilis - MIC*    AMPICILLIN <=2 SENSITIVE  Sensitive     CEFAZOLIN <=4 SENSITIVE Sensitive     CEFTRIAXONE <=1 SENSITIVE Sensitive     CIPROFLOXACIN <=0.25 SENSITIVE Sensitive     GENTAMICIN <=1 SENSITIVE Sensitive     IMIPENEM 2 SENSITIVE Sensitive     NITROFURANTOIN 128 RESISTANT Resistant     TRIMETH/SULFA <=20 SENSITIVE Sensitive     AMPICILLIN/SULBACTAM <=2 SENSITIVE Sensitive     PIP/TAZO <=4 SENSITIVE Sensitive     * >=100,000 COLONIES/mL PROTEUS MIRABILIS  MRSA PCR Screening     Status: None   Collection Time: 08/21/16  5:36 AM  Result  Value Ref Range Status   MRSA by PCR NEGATIVE NEGATIVE Final    Comment:        The GeneXpert MRSA Assay (FDA approved for NASAL specimens only), is one component of a comprehensive MRSA colonization surveillance program. It is not intended to diagnose MRSA infection nor to guide or monitor treatment for MRSA infections.      Time spent in discharge (includes decision making & examination of pt): 35 minutes  08/25/2016, 12:18 PM   Lonia Blood, MD [facility] Office  [phone] Pager [phone]  On-Call/Text Page:      [email or web address]      password [facility]

## 2016-08-26 ENCOUNTER — Encounter (HOSPITAL_COMMUNITY): Payer: Self-pay | Admitting: Cardiovascular Disease

## 2016-08-27 ENCOUNTER — Telehealth: Payer: Self-pay | Admitting: Internal Medicine

## 2016-08-27 DIAGNOSIS — I10 Essential (primary) hypertension: Secondary | ICD-10-CM

## 2016-08-27 DIAGNOSIS — I5023 Acute on chronic systolic (congestive) heart failure: Secondary | ICD-10-CM

## 2016-08-27 MED ORDER — CANDESARTAN CILEXETIL 4 MG PO TABS: 4.0000 mg | ORAL_TABLET | Freq: Every day | ORAL | 0 refills | Status: DC

## 2016-08-27 NOTE — Telephone Encounter (Signed)
Daughter aware and verbalized understanding

## 2016-08-27 NOTE — Telephone Encounter (Signed)
Pt c/o medication issue:  1. Name of Medication: cozar   2. How are you currently taking this medication (dosage and times per day)? unknown  3. Are you having a reaction (difficulty breathing--STAT)?no  4. What is your medication issue? Broken out in a rash

## 2016-08-27 NOTE — Telephone Encounter (Signed)
Recommendation:  1. STOP taking losartan (add to allergies on Epic) 2. Take benadryl 25mg  PRN for allergic reaction 3. Patient with HF; losartan discontinued today, New Rx for candesartan 4mg  sent to [facility]  F/u office visit with Dr Rennis GoldenHilty on 09/28/2016

## 2016-08-27 NOTE — Telephone Encounter (Signed)
Returned call to daughter [facility] who states that patient is broken who in hives on her back and stomach, red and itchy.  Reports she was started on losartan in the hospital and believes that this is causing her reaction.   States she thinks she has had a reaction in the past but wasn't sure if it was this medication but now she remembers and thinks it was.  Rash started this AM,  Patient does not have any SOB or trouble breathing, no oral swelling.  States she did not give the losartan this AM.    Advised I would route to pharmacist for review and recommendations.  Daughter verbalized understanding.

## 2016-09-28 ENCOUNTER — Encounter: Payer: Self-pay | Admitting: Internal Medicine

## 2016-09-28 ENCOUNTER — Ambulatory Visit (INDEPENDENT_AMBULATORY_CARE_PROVIDER_SITE_OTHER): Payer: Medicare Other | Admitting: Internal Medicine

## 2016-09-28 VITALS — BP 132/64 | HR 71 | Ht 67.0 in | Wt 169.0 lb

## 2016-09-28 DIAGNOSIS — J209 Acute bronchitis, unspecified: Secondary | ICD-10-CM | POA: Insufficient documentation

## 2016-09-28 DIAGNOSIS — I480 Paroxysmal atrial fibrillation: Secondary | ICD-10-CM | POA: Diagnosis not present

## 2016-09-28 DIAGNOSIS — I5021 Acute systolic (congestive) heart failure: Secondary | ICD-10-CM | POA: Diagnosis not present

## 2016-09-28 DIAGNOSIS — I5023 Acute on chronic systolic (congestive) heart failure: Secondary | ICD-10-CM | POA: Insufficient documentation

## 2016-09-28 DIAGNOSIS — I10 Essential (primary) hypertension: Secondary | ICD-10-CM | POA: Diagnosis not present

## 2016-09-28 MED ORDER — CANDESARTAN CILEXETIL 4 MG PO TABS: 4.0000 mg | ORAL_TABLET | Freq: Every day | ORAL | 3 refills | Status: DC

## 2016-09-28 MED ORDER — RIVAROXABAN 20 MG PO TABS: 20.0000 mg | ORAL_TABLET | Freq: Every day | ORAL | 11 refills | Status: DC

## 2016-09-28 NOTE — Patient Instructions (Signed)
Start Candesartan 4 mg daily    Your physician wants you to follow-up in: 6 months. You will receive a reminder letter in the mail two months in advance. If you don't receive a letter, please call our office to schedule the follow-up appointment.

## 2016-09-28 NOTE — Progress Notes (Signed)
OFFICE FOLLOW-UP NOTE  Chief Complaint:  Cough, raspy voice  Primary Care Physician: Johny Blamer, MD  HPI:  [name] is a 83 y.o. female with a past medial history significant for hypertension, hypothyroidism, history of heart murmur, stage III chronic kidney disease, congestive heart failure, who was recently hospitalized for new onset atrial fibrillation and congestive heart failure. She had had recent knee replacement surgery complicated by DVT and has been on Xarelto since 08/07/2016. She reported some palpitations before surgery but did not tell anyone and then developed progressive dyspnea on exertion and chest tightness. When she was admitted she was found to be in A. fib with RVR and an echo showed an EF 25-30%. Heart rate was controlled with diltiazem and she underwent TEE guided cardioversion. This resulted in successful conversion to sinus rhythm. The study indicated her EF had a ready improved up to 45-50% with rate control. Based on these findings I recommended adding low-dose losartan for heart failure as well as the Toprol. After discharge she was noted to develop hives, and this was thought to be due to losartan. This was discontinued and she was switched to candesartan. She's been taking that without incident, however developed cough and progressive shortness of breath. She was felt to have a bronchitis. She was treated with antibiotics and was seen again today by her primary care provider who has placed her on steroids. Despite this she has maintained sinus rhythm.  PMHx:  Past Medical History:  Diagnosis Date  . Aneurysm (HCC)    brain  rt side  . Anxiety   . Arthritis   . CHF exacerbation (HCC) 08/21/2016  . Chronic kidney disease    stage 3  . Dyspnea   . GERD (gastroesophageal reflux disease)   . Heart murmur    years ago asymp  . History of hiatal hernia   . Hypertension   . Hypothyroidism   . Neuropathy     Past Surgical History:  Procedure  Laterality Date  . ABDOMINAL HYSTERECTOMY     1973  . ANKLE FRACTURE SURGERY     left 2009  . CARDIOVERSION N/A 08/24/2016   Procedure: CARDIOVERSION;  Surgeon: Elease Hashimoto Deloris Ping, MD;  [facility];  Service: Cardiovascular;  Laterality: N/A;  . CATARACT EXTRACTION, BILATERAL    . CEREBRAL ANEURYSM REPAIR     2009  . CHOLECYSTECTOMY    . JOINT REPLACEMENT     rt hip 1993  . SHOULDER SURGERY     left 2003  . TEE WITHOUT CARDIOVERSION N/A 08/24/2016   Procedure: TRANSESOPHAGEAL ECHOCARDIOGRAM (TEE);  Surgeon: Elease Hashimoto Deloris Ping, MD;  Location: [facility] ENDOSCOPY;  Service: Cardiovascular;  Laterality: N/A;  . TOTAL HIP ARTHROPLASTY Left 02/08/2016   Procedure: TOTAL HIP ARTHROPLASTY ANTERIOR APPROACH;  Surgeon: Loreta Ave, MD;  Location: [facility] OR;  Service: Orthopedics;  Laterality: Left;  . TOTAL KNEE ARTHROPLASTY Right 07/25/2016  . TOTAL KNEE ARTHROPLASTY Right 07/25/2016   Procedure: TOTAL KNEE ARTHROPLASTY;  Surgeon: Loreta Ave, MD;  Location: [facility] OR;  Service: Orthopedics;  Laterality: Right;    FAMHx:  Family History  Problem Relation Age of Onset  . Hypertension Mother        died in her mid 65's  . Other Father        unknown health  . Alzheimer's disease Sister   . Hypertension Sister   . Other Brother        some type of hemmorrhage  . Hypertension Daughter  SOCHx:   reports that she has never smoked. She has never used smokeless tobacco. She reports that she does not drink alcohol or use drugs.  ALLERGIES:  Allergies  Allergen Reactions  . Losartan Potassium Hives  . No Known Allergies     ROS: Pertinent items noted in HPI and remainder of comprehensive ROS otherwise negative.  HOME MEDS: Current Outpatient Prescriptions on File Prior to Visit  Medication Sig Dispense Refill  . levothyroxine (SYNTHROID, LEVOTHROID) 88 MCG tablet Take 88 mcg by mouth daily before breakfast.    . metoprolol tartrate (LOPRESSOR) 25 MG tablet Take 1 tablet (25 mg  total) by mouth 2 (two) times daily. 60 tablet 0  . sertraline (ZOLOFT) 100 MG tablet Take 50-100 mg by mouth See admin instructions. Take 1 tablet after breakfast and take 1/2 tablet after supper     No current facility-administered medications on file prior to visit.     LABS/IMAGING: No results found for this or any previous visit (from the past 48 hour(s)). No results found.  LIPID PANEL: No results found for: CHOL, TRIG, HDL, CHOLHDL, VLDL, LDLCALC, LDLDIRECT   WEIGHTS: Wt Readings from Last 3 Encounters:  09/28/16 169 lb (76.7 kg)  08/24/16 172 lb 2.9 oz (78.1 kg)  07/25/16 180 lb (81.6 kg)    VITALS: BP 132/64   Pulse 71   Ht 5\' 7"  (1.702 m)   Wt 169 lb (76.7 kg)   BMI 26.47 kg/m   EXAM: General appearance: alert and no distress Neck: no carotid bruit, no JVD and thyroid not enlarged, symmetric, no tenderness/mass/nodules Lungs: diminished breath sounds bilaterally and rhonchi LLL and RUL Heart: regular rate and rhythm Abdomen: soft, non-tender; bowel sounds normal; no masses,  no organomegaly Extremities: extremities normal, atraumatic, no cyanosis or edema Pulses: 2+ and symmetric Skin: Skin color, texture, turgor normal. No rashes or lesions Neurologic: Grossly normal Psych: Pleasant  EKG: Normal sinus rhythm at 71 - personally reviewed  ASSESSMENT: 1. Paroxysmal atrial fibrillation-maintaining sinus after cardioversion 2. Acute systolic congestive heart failure-LVEF 25-30%, improved to 45-50% 3. Hypertension 4. Acute bronchitis  PLAN: 1.   Mrs. Burgueno is maintaining sinus rhythm after cardioversion. She is appropriately on Xarelto for anticoagulation and a CHADSVASC score of 5. This will be continued indefinitely. She is also on candesartan and beta blocker for her heart failure. I expect her LVEF to improve and will likely reassess that with echo in about 6 months. She has an acute bronchitis and is appropriately treated with antibiotics and steroids. She  may benefit from a cough suppressant to help her with sleep at night. Follow-up with me in 6 months.  Chrystie Nose, MD, [facility]  [place]  [facility]  Attending Cardiologist  Direct Dial: [phone]  Fax: [phone]  Website:  [email or web address] Nicely Shaquitta Burbridge 09/28/2016, 6:03 PM

## 2016-11-09 ENCOUNTER — Telehealth: Payer: Self-pay | Admitting: Internal Medicine

## 2016-11-09 ENCOUNTER — Ambulatory Visit (INDEPENDENT_AMBULATORY_CARE_PROVIDER_SITE_OTHER)
Admission: RE | Admit: 2016-11-09 | Discharge: 2016-11-09 | Disposition: A | Discharge status: Home / Self Care | Payer: Medicare Other | Source: Ambulatory Visit | Attending: Internal Medicine | Admitting: Internal Medicine

## 2016-11-09 ENCOUNTER — Encounter: Payer: Self-pay | Admitting: Internal Medicine

## 2016-11-09 ENCOUNTER — Ambulatory Visit (INDEPENDENT_AMBULATORY_CARE_PROVIDER_SITE_OTHER): Payer: Medicare Other | Admitting: Internal Medicine

## 2016-11-09 VITALS — BP 118/60 | HR 61 | Ht 65.0 in | Wt 167.0 lb

## 2016-11-09 DIAGNOSIS — R0609 Other forms of dyspnea: Secondary | ICD-10-CM

## 2016-11-09 DIAGNOSIS — R05 Cough: Secondary | ICD-10-CM

## 2016-11-09 DIAGNOSIS — R058 Other specified cough: Secondary | ICD-10-CM | POA: Insufficient documentation

## 2016-11-09 MED ORDER — PREDNISONE 10 MG PO TABS: ORAL_TABLET | ORAL | 0 refills | Status: DC

## 2016-11-09 MED ORDER — FLUTTER DEVI: 0 refills | Status: AC

## 2016-11-09 MED ORDER — AMOXICILLIN-POT CLAVULANATE 875-125 MG PO TABS: 1.0000 | ORAL_TABLET | Freq: Two times a day (BID) | ORAL | 0 refills | Status: AC

## 2016-11-09 MED ORDER — FAMOTIDINE 20 MG PO TABS: ORAL_TABLET | ORAL | 2 refills | Status: DC

## 2016-11-09 MED ORDER — PANTOPRAZOLE SODIUM 40 MG PO TBEC: 40.0000 mg | DELAYED_RELEASE_TABLET | Freq: Every day | ORAL | 2 refills | Status: DC

## 2016-11-09 NOTE — Progress Notes (Signed)
Subjective:     Patient ID: [name], female   DOB: 03-Feb-1928,   MRN: [id number]  HPI  31 yowf never smoker does not remember Why saw [facility] in ? 1990's  But  tendency to sneezing with pollen x decades s h/o asthma but with onset of daily  cough while on atacand in early August 2018 > stopped by Dr Tiburcio Pea 11/01/16 and referred to pulmonary clinic 11/09/2016 by Dr   Tiburcio Pea p failing to improve on p abx/ prednisone    11/09/2016 1st [facility] office visit/ Mariana Goytia   Chief Complaint  Patient presents with  . Pulmonary Consult    Referred by Dr. Johny Blamer.  Pt c/o cough since August 2018- occ prod with yellow sputum.  Cough tends to be worse at night and she wakes up often. She has been "constantly" SOB for the past few days.    worse at hs and early in am p abrupt onset early august 2018   > rx pred/ abx = zpak Coughs to point of gag but no vomit but does note  urinary incont Leg swelling is baseline for her  Sob even if not coughing x sev days prior to OV    No obvious day to day or daytime variability or assoc excess/ purulent sputum or mucus plugs or hemoptysis or cp or chest tightness, subjective wheeze or overt sinus or hb symptoms. No unusual exp hx or h/o childhood pna/ asthma or knowledge of premature birth.    Also denies any obvious fluctuation of symptoms with weather or environmental changes or other aggravating or alleviating factors except as outlined above   Current Allergies, Complete Past Medical History, Past Surgical History, Family History, and Social History were reviewed in [employer] record.  ROS  The following are not active complaints unless bolded Hoarseness, sore throat, dysphagia, dental problems, itching, sneezing,  nasal congestion or discharge of excess mucus or purulent secretions, ear ache,   fever, chills, sweats, unintended wt loss or wt gain, classically pleuritic or exertional cp,  orthopnea pnd or leg swelling,  presyncope, palpitations, abdominal pain, anorexia, nausea, vomiting, diarrhea  or change in bowel habits or change in bladder habits, change in stools or change in urine, dysuria, hematuria,  rash, arthralgias, visual complaints, headache, numbness, weakness or ataxia or problems with walking or coordination,  change in mood/affect or memory.        Current Meds  Medication Sig  . CALCIUM PO Take 1 tablet by mouth daily.  . Coenzyme Q10 (CO Q 10 PO) Take 1 capsule by mouth daily.  Marland Kitchen levothyroxine (SYNTHROID, LEVOTHROID) 88 MCG tablet Take 88 mcg by mouth daily before breakfast.  . MAGNESIUM PO Take 1 tablet by mouth daily.  . metoprolol tartrate (LOPRESSOR) 25 MG tablet Take 1 tablet (25 mg total) by mouth 2 (two) times daily.  . Multiple Vitamin (MULTIVITAMIN) capsule Take 1 capsule by mouth daily.  . rivaroxaban (XARELTO) 20 MG TABS tablet Take 1 tablet (20 mg total) by mouth daily with supper.  . sertraline (ZOLOFT) 100 MG tablet Take 50-100 mg by mouth See admin instructions. Take 1 tablet after breakfast and take 1/2 tablet after supper            Review of Systems     Objective:   Physical Exam     amb wf walks with 2 wheeled rolling walker/ very congested sounding cough    Wt Readings from Last 3 Encounters:  11/09/16  167 lb (75.8 kg)  09/28/16 169 lb (76.7 kg)  08/24/16 172 lb 2.9 oz (78.1 kg)    Vital signs reviewed   - Note on arrival 02 sats  95% on RA     HEENT: nl   turbinates bilaterally, and oropharynx which is pristine . Nl external ear canals without cough reflex- top denture / lower partial    NECK :  without JVD/Nodes/TM/ nl carotid upstrokes bilaterally   LUNGS: no acc muscle use,  Nl contour chest with insp and exp rhonchi bilaterally mostly upper airway in nature   CV:  RRR  no s3 or murmur or increase in P2, and 1+ pitting sym bilaterally edema   ABD:  soft and nontender with nl inspiratory excursion in the supine position. No bruits or  organomegaly appreciated, bowel sounds nl  MS:  Nl gait/ ext warm without deformities, calf tenderness, cyanosis or clubbing No obvious joint restrictions   SKIN: warm and dry without lesions    NEURO:  alert, approp, nl sensorium with  no motor or cerebellar deficits apparent.       I personally reviewed images and agree with radiology impression as follows:   Chest CTa 08/21/16  1. No CT evidence of pulmonary embolism. 2. Cardiomegaly with evidence of CHF, small bilateral pleural effusions and mild interstitial edema. Pneumonia is not excluded. Clinical correlation is recommended. 3. Right hilar and mediastinal adenopathy, likely reactive.   CXR PA and Lateral:   11/09/2016 :    I personally reviewed images and agree with radiology impression as follows:   1. Hyperinflation with left basilar scarring. Streaky atelectasis versus mild infiltrate over the lingula.  Labs ordered 11/09/2016  Allergy profile / did not go to lab as req    Assessment:

## 2016-11-09 NOTE — Patient Instructions (Signed)
Augmentin 875 mg take one pill twice daily  X 10 days - take at breakfast and supper with large glass of water.  It would help reduce the usual side effects (diarrhea and yeast infections) if you ate cultured yogurt at lunch.   For cough > mucinex dm 1200 mg every 12 hours and use the flutter valve as much as possible   Pantoprazole (protonix) 40 mg   Take  30-60 min before first meal of the day and Pepcid (famotidine)  20 mg one @  bedtime until return to office - this is the best way to tell whether stomach acid is contributing to your problem.    GERD (REFLUX)  is an extremely common cause of respiratory symptoms just like yours , many times with no obvious heartburn at all.    It can be treated with medication, but also with lifestyle changes including elevation of the head of your bed (ideally with 6 inch  bed blocks),  Smoking cessation, avoidance of late meals, excessive alcohol, and avoid fatty foods, chocolate, peppermint, colas, red wine, and acidic juices such as orange juice.  NO MINT OR MENTHOL PRODUCTS SO NO COUGH DROPS   USE SUGARLESS CANDY INSTEAD (Jolley ranchers or Stover's or Life Savers) or even ice chips will also do - the key is to swallow to prevent all throat clearing. NO OIL BASED VITAMINS - use powdered substitutes.   Prednisone 10 mg take  4 each am x 2 days,   2 each am x 2 days,  1 each am x 2 days and stop    Please remember to go to the lab and x-ray department downstairs in the basement  for your tests - we will call you with the results when they are available.     Please schedule a follow up office visit in 4 weeks, sooner if needed

## 2016-11-09 NOTE — Telephone Encounter (Signed)
Spoke with daughter and advised her that oil based vitamin capsules are the vitamins she should avoid because they can cause a cough. Pt's daughter understood and nothing else is needed. FYI MW.

## 2016-11-10 NOTE — Assessment & Plan Note (Signed)
Did not go to lab to complete the w/u, will address on return if not better with rx of uacs

## 2016-11-10 NOTE — Assessment & Plan Note (Addendum)
Instructed on flutter valve 11/09/2016  Empirical rx for sinusitis/gerd 11/09/2016 >>>      The most common causes of chronic cough in immunocompetent adults include the following: upper airway cough syndrome (UACS), previously referred to as postnasal drip syndrome (PNDS), which is caused by variety of rhinosinus conditions; (2) asthma; (3) GERD; (4) chronic bronchitis from cigarette smoking or other inhaled environmental irritants; (5) nonasthmatic eosinophilic bronchitis; and (6) bronchiectasis.   These conditions, singly or in combination, have accounted for up to 94% of the causes of chronic cough in prospective studies.   Other conditions have constituted no >6% of the causes in prospective studies These have included bronchogenic carcinoma, chronic interstitial pneumonia, sarcoidosis, left ventricular failure, ACEI-induced cough, and aspiration from a condition associated with pharyngeal dysfunction.    Chronic cough is often simultaneously caused by more than one condition. A single cause has been found from 38 to 82% of the time, multiple causes from 18 to 62%. Multiply caused cough has been the result of three diseases up to 42% of the time.       Most likely this is multifactorial/ cyclical cough c/w Upper airway cough syndrome (previously labeled PNDS),  is so named because it's frequently impossible to sort out how much is  CR/sinusitis with freq throat clearing (which can be related to primary GERD)   vs  causing  secondary (" extra esophageal")  GERD from wide swings in gastric pressure that occur with throat clearing, often  promoting self use of mint and menthol lozenges that reduce the lower esophageal sphincter tone and exacerbate the problem further in a cyclical fashion.   These are the same pts (now being labeled as having "irritable larynx syndrome" by some cough centers) who not infrequently have a history of having failed to tolerate ace inhibitors,  dry powder inhalers or  biphosphonates or report having atypical/extraesophageal reflux symptoms that don't respond to standard doses of PPI  and are easily confused as having aecopd or asthma flares by even experienced allergists/ pulmonologists (myself included).    Of the three most common causes of  Sub-acute or recurrent or chronic cough, only one (GERD)  can actually contribute to/ trigger  the other two (asthma and post nasal drip syndrome)  and perpetuate the cylce of cough.  While not intuitively obvious, many patients with chronic low grade reflux do not cough until there is a primary insult that disturbs the protective epithelial barrier and exposes sensitive nerve endings.   This is typically viral or due to flare of allergic rhinitis as may have been the case here  but can be direct physical injury such as with an endotracheal tube.   The point is that once this occurs, it is difficult to eliminate the cycle  using anything but a maximally effective acid suppression regimen at least in the short run, accompanied by an appropriate diet to address non acid GERD and control / eliminate the cough itself for at least 3 days.    rec max rx for GERD/ possible underlying sinusitis/ eliminate cyclical cough with mucinex dm/ flutter valve/ and f/u in 4 weeks   Total time devoted to counseling  > 50 % of initial 60 min office visit:  review case with pt/youngest of 3 daughters/  discussion of options/alternatives/ personally creating written customized instructions  in presence of pt  then going over those specific  Instructions directly with the pt including how to use all of the meds but in particular covering each  new medication in detail and the difference between the maintenance= "automatic" meds and the prns using an action plan format for the latter (If this problem/symptom => do that organization reading Left to right).  Please see AVS from this visit for a full list of these instructions which I personally wrote for  this pt and  are unique to this visit.

## 2016-11-12 ENCOUNTER — Telehealth: Payer: Self-pay | Admitting: Internal Medicine

## 2016-11-12 NOTE — Progress Notes (Signed)
LMTCB

## 2016-11-12 NOTE — Telephone Encounter (Signed)
Notes recorded by Nyoka Cowden, MD on 11/09/2016 at 4:23 PM EDT Call pt: Reviewed cxr and no acute change so no change in recommendations made at ov  Pt['s daughter is aware of MW's recommendations and voiced her understanding. Nothing further needed.

## 2016-12-07 ENCOUNTER — Ambulatory Visit: Payer: Medicare Other | Admitting: Internal Medicine

## 2017-01-09 ENCOUNTER — Encounter (HOSPITAL_COMMUNITY): Payer: Self-pay | Admitting: Orthopedic Surgery

## 2017-01-17 ENCOUNTER — Encounter: Payer: Self-pay | Admitting: Internal Medicine

## 2017-01-17 ENCOUNTER — Ambulatory Visit: Payer: Medicare Other | Admitting: Internal Medicine

## 2017-01-17 VITALS — BP 144/62 | HR 65 | Ht 65.0 in | Wt 163.4 lb

## 2017-01-17 DIAGNOSIS — I5023 Acute on chronic systolic (congestive) heart failure: Secondary | ICD-10-CM | POA: Diagnosis not present

## 2017-01-17 DIAGNOSIS — I519 Heart disease, unspecified: Secondary | ICD-10-CM | POA: Diagnosis not present

## 2017-01-17 DIAGNOSIS — I480 Paroxysmal atrial fibrillation: Secondary | ICD-10-CM

## 2017-01-17 DIAGNOSIS — J209 Acute bronchitis, unspecified: Secondary | ICD-10-CM | POA: Diagnosis not present

## 2017-01-17 NOTE — Progress Notes (Signed)
OFFICE FOLLOW-UP NOTE  Chief Complaint:  Persistent cough  Primary Care Physician: Johny BlamerHarris, William, MD  HPI:  [name] is a 83 y.o. female with a past medial history significant for hypertension, hypothyroidism, history of heart murmur, stage III chronic kidney disease, congestive heart failure, who was recently hospitalized for new onset atrial fibrillation and congestive heart failure. She had had recent knee replacement surgery complicated by DVT and has been on Xarelto since 08/07/2016. She reported some palpitations before surgery but did not tell anyone and then developed progressive dyspnea on exertion and chest tightness. When she was admitted she was found to be in A. fib with RVR and an echo showed an EF 25-30%. Heart rate was controlled with diltiazem and she underwent TEE guided cardioversion. This resulted in successful conversion to sinus rhythm. The study indicated her EF had a ready improved up to 45-50% with rate control. Based on these findings I recommended adding low-dose losartan for heart failure as well as the Toprol. After discharge she was noted to develop hives, and this was thought to be due to losartan. This was discontinued and she was switched to candesartan. She's been taking that without incident, however developed cough and progressive shortness of breath. She was felt to have a bronchitis. She was treated with antibiotics and was seen again today by her primary care provider who has placed her on steroids. Despite this she has maintained sinus rhythm.  01/17/2017  [name] returns today for follow-up.  Since I last saw her she has had persistent cough which is raspy and sounds bronchitic.  She was seen by Dr. Sherene SiresWert who felt like she had chronic, habitual cough, after treatment with Augmentin, Protonix and steroids.  Chest x-rays have been negative.  She has not responded to cough syrups.  She is scheduled to see an ENT coming up as she has had significant  head congestion as well.  The symptoms have started around the time when she was hospitalized for A. fib and congestive heart failure.  She was started on Toprol and losartan however her losartan was discontinued due to developing hives.  She was switched to candesartan, however that medication has been discontinued.  She is currently on metoprolol tartrate 25 mg twice daily.  Of note her LVEF has improved but not normalized after restoring sinus rhythm.  Her middle daughter who is accompanying her today is concerned that her cough may be related to Xarelto.  Her research indicated that it may cause "flulike symptoms".  I reassured her that this would be a very unusual side effect of this medication that we have not seen and I think there is some other etiology that has not yet been identified.  PMHx:  Past Medical History:  Diagnosis Date  . Aneurysm (HCC)    brain  rt side  . Anxiety   . Arthritis   . CHF exacerbation (HCC) 08/21/2016  . Chronic kidney disease    stage 3  . Dyspnea   . GERD (gastroesophageal reflux disease)   . Heart murmur    years ago asymp  . History of hiatal hernia   . Hypertension   . Hypothyroidism   . Neuropathy     Past Surgical History:  Procedure Laterality Date  . ABDOMINAL HYSTERECTOMY     1973  . ANKLE FRACTURE SURGERY     left 2009  . CARDIOVERSION N/A 08/24/2016   Procedure: CARDIOVERSION;  Surgeon: Elease HashimotoNahser, Deloris PingPhilip J, MD;  Location: [facility] ENDOSCOPY;  Service: Cardiovascular;  Laterality: N/A;  . CATARACT EXTRACTION, BILATERAL    . CEREBRAL ANEURYSM REPAIR     2009  . CHOLECYSTECTOMY    . JOINT REPLACEMENT     rt hip 1993  . SHOULDER SURGERY     left 2003  . TEE WITHOUT CARDIOVERSION N/A 08/24/2016   Procedure: TRANSESOPHAGEAL ECHOCARDIOGRAM (TEE);  Surgeon: Elease Hashimoto Deloris Ping, MD;  Location[facility] ENDOSCOPY;  Service: Cardiovascular;  Laterality: N/A;  . TOTAL HIP ARTHROPLASTY Left 02/08/2016   Procedure: TOTAL HIP ARTHROPLASTY ANTERIOR APPROACH;   Surgeon: Loreta Ave, MD;  [facility];  Service: Orthopedics;  Laterality: Left;  . TOTAL KNEE ARTHROPLASTY Right 07/25/2016  . TOTAL KNEE ARTHROPLASTY Right 07/25/2016   Procedure: TOTAL KNEE ARTHROPLASTY;  Surgeon: Loreta Ave, MD;  Location: [facility] OR;  Service: Orthopedics;  Laterality: Right;    FAMHx:  Family History  Problem Relation Age of Onset  . Hypertension Mother        died in her mid 108's  . Other Father        unknown health  . Alzheimer's disease Sister   . Hypertension Sister   . Other Brother        some type of hemmorrhage  . Hypertension Daughter     SOCHx:   reports that  has never smoked. she has never used smokeless tobacco. She reports that she does not drink alcohol or use drugs.  ALLERGIES:  Allergies  Allergen Reactions  . Losartan Potassium Hives    ROS: Pertinent items noted in HPI and remainder of comprehensive ROS otherwise negative.  HOME MEDS: Current Outpatient Medications on File Prior to Visit  Medication Sig Dispense Refill  . CALCIUM PO Take 1 tablet by mouth daily.    . Coenzyme Q10 (CO Q 10 PO) Take 1 capsule by mouth daily.    Marland Kitchen levothyroxine (SYNTHROID, LEVOTHROID) 88 MCG tablet Take 88 mcg by mouth daily before breakfast.    . MAGNESIUM PO Take 1 tablet by mouth daily.    . metoprolol tartrate (LOPRESSOR) 25 MG tablet Take 1 tablet (25 mg total) by mouth 2 (two) times daily. 60 tablet 0  . Multiple Vitamin (MULTIVITAMIN) capsule Take 1 capsule by mouth daily.    Marland Kitchen Respiratory Therapy Supplies (FLUTTER) DEVI Use as directed 1 each 0  . rivaroxaban (XARELTO) 20 MG TABS tablet Take 1 tablet (20 mg total) by mouth daily with supper. 30 tablet 11  . sertraline (ZOLOFT) 100 MG tablet Take 50-100 mg by mouth See admin instructions. Take 1 tablet after breakfast and take 1/2 tablet after supper     No current facility-administered medications on file prior to visit.     LABS/IMAGING: No results found for this or any  previous visit (from the past 48 hour(s)). No results found.  LIPID PANEL: No results found for: CHOL, TRIG, HDL, CHOLHDL, VLDL, LDLCALC, LDLDIRECT   WEIGHTS: Wt Readings from Last 3 Encounters:  01/17/17 163 lb 6.4 oz (74.1 kg)  11/09/16 167 lb (75.8 kg)  09/28/16 169 lb (76.7 kg)    VITALS: BP (!) 144/62   Pulse 65   Ht 5\' 5"  (1.651 m)   Wt 163 lb 6.4 oz (74.1 kg)   BMI 27.19 kg/m   EXAM: General appearance: alert and no distress Neck: no carotid bruit, no JVD and thyroid not enlarged, symmetric, no tenderness/mass/nodules Lungs: diminished breath sounds bilaterally and rhonchi LLL and RUL Heart: regular rate and rhythm Abdomen: soft, non-tender; bowel sounds  normal; no masses,  no organomegaly Extremities: extremities normal, atraumatic, no cyanosis or edema Pulses: 2+ and symmetric Skin: Skin color, texture, turgor normal. No rashes or lesions Neurologic: Grossly normal Psych: Pleasant  EKG: Normal sinus rhythm at 65, nonspecific ST changes-personally reviewed  ASSESSMENT: 1. Paroxysmal atrial fibrillation-maintaining sinus after cardioversion 2. Acute systolic congestive heart failure-LVEF 25-30%, improved to 45-50% 3. Hypertension 4. Chronic bronchitis  PLAN: 1.   [name] has maintained sinus after cardioversion and had improved LVEF from 25-30% up to 45-50%.  She has a history of hypertension which is been controlled.  She was on 2 medications for heart failure, but losartan was discontinued due to rash.  She was supposedly switched to candesartan, but it was discontinued, possibly due to a very small but unlikely chance of it causing cough by her pulmonologist. She was also on Toprol-XL but is now on metoprolol tartrate.  I advised that she continue the metoprolol tartrate, it is not clear why this was switched or as to how she started on the medication.  I also advised continuing on Xarelto however her daughter felt that they may wish to discontinue it.  I  offered to switch her over to warfarin which is less favorable however they did no wish to do that due to the need for fingerstick checks.  Plan today's repeat a weighted echocardiogram for LV function.  We will continue her current medications and see her back in 6 months.  Chrystie NoseKenneth C. Tonika Eden, MD, [facility] Facility[facility]ACC  [place]  [facility]  Attending Cardiologist  Direct Dial: [phone]  Fax: [phone]  Website:  [email or web address]  Florinda Taflinger C Delando Satter 01/17/2017, 5:00 PM

## 2017-01-17 NOTE — Patient Instructions (Signed)
Your physician has requested that you have a limited echocardiogram @ [address]. Echocardiography is a painless test that uses sound waves to create images of your heart. It provides your doctor with information about the size and shape of your heart and how well your heart's chambers and valves are working. This procedure takes approximately one hour. There are no restrictions for this procedure.    Your physician wants you to follow-up in: 6 months with Dr. Hilty. You will receive a reminder letter in the mail two  months in advance. If you don't receive a letter, please call our office to schedule the follow-up appointment.  

## 2017-02-06 ENCOUNTER — Other Ambulatory Visit: Payer: Self-pay

## 2017-02-06 ENCOUNTER — Ambulatory Visit (HOSPITAL_COMMUNITY): Payer: Medicare Other | Attending: Cardiovascular Disease

## 2017-02-06 DIAGNOSIS — I130 Hypertensive heart and chronic kidney disease with heart failure and stage 1 through stage 4 chronic kidney disease, or unspecified chronic kidney disease: Secondary | ICD-10-CM | POA: Diagnosis not present

## 2017-02-06 DIAGNOSIS — N189 Chronic kidney disease, unspecified: Secondary | ICD-10-CM | POA: Diagnosis not present

## 2017-02-06 DIAGNOSIS — I509 Heart failure, unspecified: Secondary | ICD-10-CM | POA: Diagnosis not present

## 2017-02-06 DIAGNOSIS — E039 Hypothyroidism, unspecified: Secondary | ICD-10-CM | POA: Diagnosis not present

## 2017-02-06 DIAGNOSIS — G629 Polyneuropathy, unspecified: Secondary | ICD-10-CM | POA: Insufficient documentation

## 2017-02-06 DIAGNOSIS — I480 Paroxysmal atrial fibrillation: Secondary | ICD-10-CM

## 2017-02-06 DIAGNOSIS — I429 Cardiomyopathy, unspecified: Secondary | ICD-10-CM | POA: Insufficient documentation

## 2017-02-06 DIAGNOSIS — F419 Anxiety disorder, unspecified: Secondary | ICD-10-CM | POA: Insufficient documentation

## 2017-02-06 DIAGNOSIS — I519 Heart disease, unspecified: Secondary | ICD-10-CM | POA: Diagnosis not present

## 2017-07-17 LAB — BASIC METABOLIC PANEL
BUN: 18 (ref 4–21)
CO2: 27 — AB (ref 13–22)
Chloride: 102 (ref 99–108)
Creatinine: 0.8 (ref 0.5–1.1)
Glucose: 87
Potassium: 4.3 (ref 3.4–5.3)
Sodium: 142 (ref 137–147)

## 2017-07-17 LAB — CBC AND DIFFERENTIAL
HCT: 41 (ref 36–46)
Hemoglobin: 13.1 (ref 12.0–16.0)
Platelets: 316 (ref 150–399)
WBC: 11.2

## 2017-07-17 LAB — COMPREHENSIVE METABOLIC PANEL
Albumin: 4 (ref 3.5–5.0)
Calcium: 9.8 (ref 8.7–10.7)

## 2017-07-17 LAB — HEPATIC FUNCTION PANEL
ALT: 9 (ref 7–35)
AST: 16 (ref 13–35)
Alkaline Phosphatase: 106 (ref 25–125)
Bilirubin, Total: 0.3

## 2017-07-17 LAB — CBC: RBC: 4.85 (ref 3.87–5.11)

## 2017-10-11 ENCOUNTER — Inpatient Hospital Stay (HOSPITAL_COMMUNITY): Admission: RE | Admit: 2017-10-11 | Payer: Medicare Other | Source: Ambulatory Visit

## 2017-10-11 ENCOUNTER — Other Ambulatory Visit (HOSPITAL_COMMUNITY): Payer: Self-pay | Admitting: *Deleted

## 2017-10-11 ENCOUNTER — Ambulatory Visit (HOSPITAL_COMMUNITY): Payer: Medicare Other

## 2017-10-11 ENCOUNTER — Ambulatory Visit (HOSPITAL_COMMUNITY)
Admission: RE | Admit: 2017-10-11 | Discharge: 2017-10-11 | Disposition: A | Discharge status: Home / Self Care | Payer: Medicare Other | Source: Ambulatory Visit | Attending: *Deleted | Admitting: *Deleted

## 2017-10-11 DIAGNOSIS — R1319 Other dysphagia: Secondary | ICD-10-CM

## 2017-10-11 DIAGNOSIS — R1310 Dysphagia, unspecified: Secondary | ICD-10-CM | POA: Diagnosis not present

## 2017-10-11 NOTE — Progress Notes (Signed)
Objective Swallowing Evaluation: Type of Study: MBS-Modified Barium Swallow Study   Patient Details  Name: [name] MRN: [id number] Date of Birth: [birth year]  Today's Date: 10/11/2017 Time: SLP Start Time (ACUTE ONLY): 9937 -SLP Stop Time (ACUTE ONLY): 1200  SLP Time Calculation (min) (ACUTE ONLY): 36 min   Past Medical History:  Past Medical History:  Diagnosis Date  . Aneurysm ([place])    brain  rt side  . Anxiety   . Arthritis   . CHF exacerbation ([place]) 08/21/2016  . Chronic kidney disease    stage 3  . Dyspnea   . GERD (gastroesophageal reflux disease)   . Heart murmur    years ago asymp  . History of hiatal hernia   . Hypertension   . Hypothyroidism   . Neuropathy    Past Surgical History:  Past Surgical History:  Procedure Laterality Date  . ABDOMINAL HYSTERECTOMY     1973  . ANKLE FRACTURE SURGERY     left 2009  . CARDIOVERSION N/A 08/24/2016   Procedure: CARDIOVERSION;  Surgeon: Acie Fredrickson Wonda Cheng, MD;  Location: [facility] ENDOSCOPY;  Service: Cardiovascular;  Laterality: N/A;  . CATARACT EXTRACTION, BILATERAL    . CEREBRAL ANEURYSM REPAIR     2009  . CHOLECYSTECTOMY    . JOINT REPLACEMENT     rt hip 1993  . SHOULDER SURGERY     left 2003  . TEE WITHOUT CARDIOVERSION N/A 08/24/2016   Procedure: TRANSESOPHAGEAL ECHOCARDIOGRAM (TEE);  Surgeon: Acie Fredrickson Wonda Cheng, MD;  Location: [facility] ENDOSCOPY;  Service: Cardiovascular;  Laterality: N/A;  . TOTAL HIP ARTHROPLASTY Left 02/08/2016   Procedure: TOTAL HIP ARTHROPLASTY ANTERIOR APPROACH;  Surgeon: Ninetta Lights, MD;  Location: [place];  Service: Orthopedics;  Laterality: Left;  . TOTAL KNEE ARTHROPLASTY Right 07/25/2016  . TOTAL KNEE ARTHROPLASTY Right 07/25/2016   Procedure: TOTAL KNEE ARTHROPLASTY;  Surgeon: Ninetta Lights, MD;  Location: [place];  Service: Orthopedics;  Laterality: Right;   HPI: 84 year old female referred from pulmonary clinic for outpatient swallow study (MBS). Pt has history of GERD, hiatal hernia, SAH  (2009), chronic cough for the past 12 months. CT chest 08/28/17 revealed dense left lower lobe consolidation suspected to be secondary to aspiration pneumonia along with presence of a dilated and patulous proximal esophagus with scattered ingested material/secretions. Bronchiectasis and micronodules bilaterally but more in the left lung concerning for an underlying indolent MAC infection. Pt had MBS 06/05/2007; no report available however pt was placed on regular diet with thin liquids. Pt reports coughing throughout the day, not associated with PO intake.     Subjective: alert, pleasant    Assessment / Plan / Recommendation  CHL IP CLINICAL IMPRESSIONS 10/11/2017  Clinical Impression Patient presents with functional oropharyngeal swallow in keeping with normative, age-related changes in swallow function. There was no aspiration of any consistency. There is intermittent trace, transient laryngeal penetration during the swallow with thin liquids due to delayed epiglottic deflection. Barium tablet lodged briefly in the valleculae, and min residue remains in the valleculae after the swallow, which clears with reflexive dry swallow. Pt noted to cough throughout the assessment which was not associated with airway compromise. Cervical esophageal phase was within normal limits, and esophageal sweep was performed and barium tablet passed into the stomach without difficulty. Given pt's history of reflux and CT chest findings of scattered ingested material/secretions in the proximal esophagus, pt was educated re: reflux precautions verbally and given handout with recommendations. Daughter reports typically eats in a recliner. SLP  encouraged upright position during and for 30 minutes after meals, and encouraged pt to eat at the table. Recommend regular diet with thin liquids, medications whole with liquid. Aspiration risk mild due to history of esophageal issues. If MD has concern for post-prandial aspiration, consider  esophagram. No further skilled ST indicated.  SLP Visit Diagnosis Dysphagia, pharyngeal phase (R13.13)  Attention and concentration deficit following --  Frontal lobe and executive function deficit following --  Impact on safety and function Mild aspiration risk      CHL IP TREATMENT RECOMMENDATION 10/11/2017  Treatment Recommendations No treatment recommended at this time     Prognosis 10/11/2017  Prognosis for Safe Diet Advancement Good  Barriers to Reach Goals --  Barriers/Prognosis Comment --    CHL IP DIET RECOMMENDATION 10/11/2017  SLP Diet Recommendations Regular solids;Thin liquid  Liquid Administration via Cup;Straw  Medication Administration Whole meds with liquid  Compensations Slow rate;Small sips/bites;Follow solids with liquid  Postural Changes Seated upright at 90 degrees;Remain semi-upright after after feeds/meals (Comment)      CHL IP OTHER RECOMMENDATIONS 10/11/2017  Recommended Consults Consider esophageal assessment  Oral Care Recommendations Oral care BID  Other Recommendations --      CHL IP FOLLOW UP RECOMMENDATIONS 10/11/2017  Follow up Recommendations None      No flowsheet data found.         CHL IP ORAL PHASE 10/11/2017  Oral Phase WFL  Oral - Pudding Teaspoon --  Oral - Pudding Cup --  Oral - Honey Teaspoon --  Oral - Honey Cup --  Oral - Nectar Teaspoon --  Oral - Nectar Cup --  Oral - Nectar Straw --  Oral - Thin Teaspoon --  Oral - Thin Cup --  Oral - Thin Straw --  Oral - Puree --  Oral - Mech Soft --  Oral - Regular --  Oral - Multi-Consistency --  Oral - Pill --  Oral Phase - Comment --    CHL IP PHARYNGEAL PHASE 10/11/2017  Pharyngeal Phase Impaired  Pharyngeal- Pudding Teaspoon --  Pharyngeal --  Pharyngeal- Pudding Cup --  Pharyngeal --  Pharyngeal- Honey Teaspoon --  Pharyngeal --  Pharyngeal- Honey Cup --  Pharyngeal --  Pharyngeal- Nectar Teaspoon --  Pharyngeal --  Pharyngeal- Nectar Cup --  Pharyngeal --  Pharyngeal-  Nectar Straw --  Pharyngeal --  Pharyngeal- Thin Teaspoon --  Pharyngeal --  Pharyngeal- Thin Cup Reduced epiglottic inversion;Penetration/Aspiration during swallow;Pharyngeal residue - valleculae  Pharyngeal Material enters airway, remains ABOVE vocal cords then ejected out  Pharyngeal- Thin Straw Reduced epiglottic inversion;Penetration/Aspiration during swallow;Pharyngeal residue - valleculae  Pharyngeal Material enters airway, remains ABOVE vocal cords then ejected out  Pharyngeal- Puree Pharyngeal residue - valleculae  Pharyngeal --  Pharyngeal- Mechanical Soft --  Pharyngeal --  Pharyngeal- Regular Pharyngeal residue - valleculae  Pharyngeal --  Pharyngeal- Multi-consistency --  Pharyngeal --  Pharyngeal- Pill Pharyngeal residue - valleculae;Penetration/Aspiration during swallow  Pharyngeal Material enters airway, remains ABOVE vocal cords then ejected out  Pharyngeal Comment --     CHL IP CERVICAL ESOPHAGEAL PHASE 10/11/2017  Cervical Esophageal Phase WFL  Pudding Teaspoon --  Pudding Cup --  Honey Teaspoon --  Honey Cup --  Nectar Teaspoon --  Nectar Cup --  Nectar Straw --  Thin Teaspoon --  Thin Cup --  Thin Straw --  Puree --  Mechanical Soft --  Regular --  Multi-consistency --  Pill --  Cervical Esophageal Comment --   Stanton Kidney  Rhunette Croft, [place], [place] Speech-Language Pathologist Acute Rehabilitation Services Pager: [phone] Office: [phone]   Aliene Altes 10/11/2017, 1:27 PM

## 2017-10-14 ENCOUNTER — Encounter (HOSPITAL_COMMUNITY): Payer: Medicare Other

## 2018-04-13 LAB — TSH: TSH: 1.8 (ref 0.41–5.90)

## 2018-04-13 LAB — HEPATIC FUNCTION PANEL
ALT: 6 — AB (ref 7–35)
AST: 12 — AB (ref 13–35)
Alkaline Phosphatase: 111 (ref 25–125)
Bilirubin, Total: 0.3

## 2018-04-13 LAB — COMPREHENSIVE METABOLIC PANEL: Calcium: 9.4 (ref 8.7–10.7)

## 2018-04-13 LAB — LIPID PANEL
Cholesterol: 145 (ref 0–200)
HDL: 47 (ref 35–70)
LDL Cholesterol: 75
Triglycerides: 120 (ref 40–160)

## 2018-04-13 LAB — BASIC METABOLIC PANEL
BUN: 19 (ref 4–21)
CO2: 26 — AB (ref 13–22)
Chloride: 103 (ref 99–108)
Creatinine: 0.8 (ref 0.5–1.1)
Glucose: 97
Potassium: 4.6 (ref 3.4–5.3)
Sodium: 141 (ref 137–147)

## 2019-01-06 DIAGNOSIS — L0390 Cellulitis, unspecified: Secondary | ICD-10-CM

## 2019-01-06 HISTORY — DX: Cellulitis, unspecified: L03.90

## 2019-06-03 ENCOUNTER — Encounter (INDEPENDENT_AMBULATORY_CARE_PROVIDER_SITE_OTHER): Payer: Self-pay

## 2019-06-03 ENCOUNTER — Encounter: Payer: Self-pay | Admitting: Nurse Practitioner

## 2019-06-03 ENCOUNTER — Ambulatory Visit (INDEPENDENT_AMBULATORY_CARE_PROVIDER_SITE_OTHER): Payer: Medicare PPO | Admitting: Nurse Practitioner

## 2019-06-03 ENCOUNTER — Other Ambulatory Visit: Payer: Self-pay

## 2019-06-03 VITALS — BP 130/70 | HR 69 | Temp 97.8°F | Resp 16 | Ht 65.0 in | Wt 132.4 lb

## 2019-06-03 DIAGNOSIS — R058 Other specified cough: Secondary | ICD-10-CM

## 2019-06-03 DIAGNOSIS — I10 Essential (primary) hypertension: Secondary | ICD-10-CM | POA: Diagnosis not present

## 2019-06-03 DIAGNOSIS — K219 Gastro-esophageal reflux disease without esophagitis: Secondary | ICD-10-CM | POA: Diagnosis not present

## 2019-06-03 DIAGNOSIS — N183 Chronic kidney disease, stage 3 unspecified: Secondary | ICD-10-CM

## 2019-06-03 DIAGNOSIS — R05 Cough: Secondary | ICD-10-CM | POA: Diagnosis not present

## 2019-06-03 DIAGNOSIS — E039 Hypothyroidism, unspecified: Secondary | ICD-10-CM | POA: Diagnosis not present

## 2019-06-03 DIAGNOSIS — F325 Major depressive disorder, single episode, in full remission: Secondary | ICD-10-CM

## 2019-06-03 DIAGNOSIS — G629 Polyneuropathy, unspecified: Secondary | ICD-10-CM

## 2019-06-03 DIAGNOSIS — J471 Bronchiectasis with (acute) exacerbation: Secondary | ICD-10-CM

## 2019-06-03 MED ORDER — DULOXETINE HCL 60 MG PO CPEP: 60.0000 mg | ORAL_CAPSULE | Freq: Every day | ORAL | 1 refills | Status: AC

## 2019-06-03 MED ORDER — OMEPRAZOLE 20 MG PO CPDR: 20.0000 mg | DELAYED_RELEASE_CAPSULE | Freq: Every day | ORAL | 3 refills | Status: AC

## 2019-06-03 NOTE — Progress Notes (Signed)
Careteam: Patient Care Team: Lauree Chandler, NP as PCP - General (Geriatric Medicine) Orvan July, MD (Pulmonary Disease) Raeford Razor, MD as Referring Physician (Plastic Surgery)  PLACE OF SERVICE:  [place] Directive information Does Patient Have a Medical Advance Directive?: Yes, Type of Advance Directive: [place];Living will, Does patient want to make changes to medical advance directive?: No - Patient declined  Allergies  Allergen Reactions  . Losartan Potassium Hives    Chief Complaint  Patient presents with  . Establish Care    New Patient.  . Health Maintenance    Discuss the need for Dexa Scan.  . Immunizations    Discuss the need for Covid Vaccine, Tetanus Vaccine, and PNA Vaccine.     HPI: Patient is a 86 y.o. female to establish care.  Looking for a more comprehensive approach.   Dysphagia- causes chronic cough, hoariness, wheezing and congestion with aspiration.   Chronic cough with bronchiolitis due to chronic aspiration- recent culture and treatment with bactrim and prednisone.  using Pulmicort BID and albuterol daily with benzonatate 100 mg TID PRN which helps.  Has flutter valve as well.   Osteoporosis- currently on cal daily  OA- suspect she is taking co-q 10, both hips replaced, right knee replacement and shoulder replacement.   LE edema- currently on lasix 20 mg daily, currently taking 2 tablet has been doing so for last month. Granddaughter thinks it was increased due to fluid in lungs.   Hypothyroid- on levothyroxine 88 mcg daily. - does not think she has had recent labs.   Nutritional supplements- taking magnesium, MVI    Short term memory loss- granddaughter reports memory is not that bad.   Depression- controlled on zoloft 100 mg BID.   Hypertension- off medication at this time, diet controlled.   GERD- mild. Does not take medication for this.   CKD, stage 3. Avoiding NSAIDS  Neuropathy-  not currently on any medication but legs burns.   Chronic wound on sacrum. Doing better, less painful. Has wound vac and following with wound care center.   Review of Systems:  Review of Systems  Constitutional: Positive for malaise/fatigue. Negative for chills and fever.  HENT: Negative for ear discharge, ear pain and sinus pain.   Eyes:       Glaucoma  Respiratory: Positive for cough, sputum production, shortness of breath and wheezing.   Cardiovascular: Positive for leg swelling. Negative for chest pain and palpitations.  Gastrointestinal: Positive for heartburn. Negative for abdominal pain, constipation and diarrhea.  Genitourinary: Negative for dysuria, frequency and urgency.  Musculoskeletal: Positive for joint pain. Negative for falls and myalgias.  Skin:       Chronic wound to sacrum  Neurological: Positive for weakness. Negative for dizziness.  Psychiatric/Behavioral: Negative for depression and memory loss. The patient is not nervous/anxious and does not have insomnia.     Past Medical History:  Diagnosis Date  . Aneurysm ([place])    brain  rt side  . Anxiety   . Arthritis   . Cellulitis 01/06/2019  . CHF exacerbation ([place]) 08/21/2016  . Chronic kidney disease    stage 3  . Dyspnea   . GERD (gastroesophageal reflux disease)   . Heart murmur    years ago asymp  . History of hiatal hernia   . History of hysterectomy 02/06/1971  . History of left hip replacement    02/06/2016. Dr.Murphy  . History of right hip replacement 02/06/1991  .  History of ruptured arterial aneurysm 02/06/2007  . History of shoulder surgery 02/05/2001  . Hypertension   . Hypothyroidism   . Neuropathy    Past Surgical History:  Procedure Laterality Date  . ABDOMINAL HYSTERECTOMY     1973  . ANKLE FRACTURE SURGERY     left 2009  . ANKLE SURGERY  02/06/2007  . CARDIOVERSION N/A 08/24/2016   Procedure: CARDIOVERSION;  Surgeon: Acie Fredrickson Wonda Cheng, MD;  Location: [facility] ENDOSCOPY;  Service:  Cardiovascular;  Laterality: N/A;  . CATARACT EXTRACTION, BILATERAL    . CEREBRAL ANEURYSM REPAIR     2009  . CHOLECYSTECTOMY    . GALLBLADDER SURGERY  02/05/2001  . JOINT REPLACEMENT     rt hip 1993  . KNEE SURGERY  07/06/2016   Knee Replacement. Dr.Murphy  . SHOULDER SURGERY     left 2003  . TEE WITHOUT CARDIOVERSION N/A 08/24/2016   Procedure: TRANSESOPHAGEAL ECHOCARDIOGRAM (TEE);  Surgeon: Acie Fredrickson Wonda Cheng, MD;  Location[facility] END[facility];  Service: Cardiovascular;  Laterality: N/A;  . TOTAL HIP ARTHROPLASTY Left 02/08/2016   Procedure: TOTAL HIP ARTHROPLASTY ANTERIOR APPROACH;  Surgeon: Ninetta Lights, MD;  Location: [facility];  Service: Orthopedics;  Laterality: Left;  . TOTAL KNEE ARTHROPLASTY Right 07/25/2016  . TOTAL KNEE ARTHROPLASTY Right 07/25/2016   Procedure: TOTAL KNEE ARTHROPLASTY;  Surgeon: Ninetta Lights, MD;  Location: [facility];  Service: Orthopedics;  Laterality: Right;   Social History:   reports that she has never smoked. She has never used smokeless tobacco. She reports current alcohol use. She reports that she does not use drugs.  Family History  Problem Relation Age of Onset  . Hypertension Mother        died in her mid 98's  . Arthritis Mother   . Other Father        unknown health  . Alzheimer's disease Sister   . Hypertension Sister   . Other Brother        some type of hemmorrhage  . Hypertension Daughter   . Stroke Daughter     Medications: Patient's Medications  New Prescriptions   No medications on file  Previous Medications   ALBUTEROL (PROVENTIL) (2.5 MG/3ML) 0.083% NEBULIZER SOLUTION    Take 2.5 mg by nebulization in the morning and at bedtime.   ALBUTEROL (VENTOLIN HFA) 108 (90 BASE) MCG/ACT INHALER    Inhale 2 puffs into the lungs every 4 (four) hours as needed.   ASPIRIN 325 MG TABLET    Take 325 mg by mouth daily.   BENZONATATE (TESSALON) 100 MG CAPSULE    Take 100 mg by mouth 3 (three) times daily as needed for cough.   BUDESONIDE  (PULMICORT) 0.5 MG/2ML NEBULIZER SOLUTION    Take 0.5 mg by nebulization 2 (two) times daily.   CALCIUM PO    Take 1 tablet by mouth daily.   COENZYME Q10 (CO Q 10 PO)    Take 1 capsule by mouth daily.   FUROSEMIDE (LASIX) 20 MG TABLET    Take 20 mg by mouth daily. 1-2 Tablets.   LEVOTHYROXINE (SYNTHROID, LEVOTHROID) 88 MCG TABLET    Take 88 mcg by mouth daily before breakfast.   MAGNESIUM PO    Take 1 tablet by mouth daily.   MULTIPLE VITAMIN (MULTIVITAMIN) CAPSULE    Take 1 capsule by mouth daily.   NUTRITIONAL SUPPLEMENTS (BOOST HIGH PROTEIN PO)    Take 30 g by mouth daily.   PREDNISONE (DELTASONE) 20 MG TABLET    Take  1 tablet by mouth daily.   RESPIRATORY THERAPY SUPPLIES (FLUTTER) DEVI    Use as directed   SERTRALINE (ZOLOFT) 100 MG TABLET    Take 50-100 mg by mouth in the morning and at bedtime.    SULFAMETHOXAZOLE-TRIMETHOPRIM (BACTRIM DS) 800-160 MG TABLET    Take 1 tablet by mouth in the morning and at bedtime.   TRIAMCINOLONE CREAM (KENALOG) 0.1 %    Apply 1 application topically in the morning and at bedtime.  Modified Medications   No medications on file  Discontinued Medications   METOPROLOL TARTRATE (LOPRESSOR) 25 MG TABLET    Take 1 tablet (25 mg total) by mouth 2 (two) times daily.   RIVAROXABAN (XARELTO) 20 MG TABS TABLET    Take 1 tablet (20 mg total) by mouth daily with supper.    Physical Exam:  Vitals:   06/03/19 1322  BP: 130/70  Pulse: 69  Resp: 16  Temp: 97.8 F (36.6 C)  SpO2: 90%  Weight: 132 lb 6.4 oz (60.1 kg)  Height: _0  (1.651 m)   Body mass index is 22.03 kg/m. Wt Readings from Last 3 Encounters:  06/03/19 132 lb 6.4 oz (60.1 kg)  01/17/17 163 lb 6.4 oz (74.1 kg)  11/09/16 167 lb (75.8 kg)    Physical Exam Constitutional:      General: She is not in acute distress.    Appearance: She is well-developed. She is not diaphoretic.  HENT:     Head: Normocephalic and atraumatic.     Mouth/Throat:     Pharynx: No oropharyngeal exudate.    Eyes:     Conjunctiva/sclera: Conjunctivae normal.     Pupils: Pupils are equal, round, and reactive to light.  Cardiovascular:     Rate and Rhythm: Normal rate and regular rhythm.     Heart sounds: Normal heart sounds.  Pulmonary:     Effort: Pulmonary effort is normal.     Breath sounds: Wheezing and rhonchi present.  Abdominal:     General: Bowel sounds are normal.     Palpations: Abdomen is soft.  Musculoskeletal:        General: No tenderness.     Cervical back: Normal range of motion and neck supple.     Right lower leg: 1+ Edema present.     Left lower leg: 1+ Edema present.     Comments: Bilateral lower extremity venous changed noted   Skin:    General: Skin is warm and dry.  Neurological:     Mental Status: She is alert and oriented to person, place, and time.     Motor: Weakness (in wheelchair) present.  Psychiatric:        Mood and Affect: Mood normal.        Behavior: Behavior normal.     Labs reviewed: Basic Metabolic Panel: No results for input(s): NA, K, CL, CO2, GLUCOSE, BUN, CREATININE, CALCIUM, MG, PHOS, TSH in the last 8760 hours. Liver Function Tests: No results for input(s): AST, ALT, ALKPHOS, BILITOT, PROT, ALBUMIN in the last 8760 hours. No results for input(s): LIPASE, AMYLASE in the last 8760 hours. No results for input(s): AMMONIA in the last 8760 hours. CBC: No results for input(s): WBC, NEUTROABS, HGB, HCT, MCV, PLT in the last 8760 hours. Lipid Panel: No results for input(s): CHOL, HDL, LDLCALC, TRIG, CHOLHDL, LDLDIRECT in the last 8760 hours. TSH: No results for input(s): TSH in the last 8760 hours. A1C: Lab Results  Component Value Date   HGBA1C  05/21/2007  5.6 (NOTE)   The ADA recommends the following therapeutic goals for glycemic   control related to Hgb A1C measurement:   Goal of Therapy:   < 7.0% Hgb A1C   Action Suggested:  > 8.0% Hgb A1C   Ref:  Diabetes Care, 22, Suppl. 1, 1999     Assessment/Plan 1. Upper airway cough  syndrome Followed by pulmonary, suspected cough was [place] to chronic aspiration and suspected chronic MAC infection has been treated with bactrim and prednisone taper. Continues to have cough, recommending her trial omeprazole 20 mg daily due to hx of GERD   2. Acquired hypothyroidism -continues on synthroid  - TSH  3. Benign essential HTN -controlled with dietary modifications, previously requiring medication but now controlled off.  - CMP with eGFR([facility]) - CBC with Differential/Platelet  4. Gastroesophageal reflux disease, unspecified whether esophagitis present -occasional symptoms, with increase in cough will have her start  Omeprazole to see if this help symptoms. - omeprazole (PRILOSEC) 20 MG capsule; Take 1 capsule (20 mg total) by mouth daily.  Dispense: 30 capsule; Refill: 3  5. Major depressive disorder in full remission, unspecified whether recurrent ([place]) Controlled on zoloft however she has neuropathy and does not wish to add another pill. Discussed changing regimen to help control both depression and help with LE neuropathy which she was agreeable. To stop zoloft and start cymbalta.  - DULoxetine (CYMBALTA) 60 MG capsule; Take 1 capsule (60 mg total) by mouth daily.  Dispense: 30 capsule; Refill: 1  6. Neuropathy -ongoing to LE - DULoxetine (CYMBALTA) 60 MG capsule; Take 1 capsule (60 mg total) by mouth daily.  Dispense: 30 capsule; Refill: 1  7. Stage 3 chronic kidney disease, unspecified whether stage 3a or 3b CKD Encourage proper hydration and to avoid NSAIDS (Aleve, Advil, Motrin, Ibuprofen)   8. Bronchiectasis with acute exacerbation ([place]) Recently treated with bactrim and prednisone. Continues on pulmicort BID with albuterol PRN.   Next appt: 6 weeks for follow up  [place] [place], [facility] [phone]

## 2019-06-03 NOTE — Patient Instructions (Signed)
Stop zoloft (do not take tonight) Tomorrow start cymbalta 60 mg daily  Start omeprazole 20 mg daily for acid reflux

## 2019-06-04 ENCOUNTER — Encounter: Payer: Self-pay | Admitting: Nurse Practitioner

## 2019-06-05 NOTE — Telephone Encounter (Signed)
Routed to Jessica Eubanks NP  

## 2019-06-06 LAB — CBC WITH DIFFERENTIAL/PLATELET
Absolute Monocytes: 543 cells/uL (ref 200–950)
Basophils Absolute: 39 cells/uL (ref 0–200)
Basophils Relative: 0.4 %
Eosinophils Absolute: 175 cells/uL (ref 15–500)
Eosinophils Relative: 1.8 %
HCT: 31.5 % — ABNORMAL LOW (ref 35.0–45.0)
Hemoglobin: 9.3 g/dL — ABNORMAL LOW (ref 11.7–15.5)
Lymphs Abs: 1038 cells/uL (ref 850–3900)
MCH: 21.4 pg — ABNORMAL LOW (ref 27.0–33.0)
MCHC: 29.5 g/dL — ABNORMAL LOW (ref 32.0–36.0)
MCV: 72.6 fL — ABNORMAL LOW (ref 80.0–100.0)
MPV: 10.1 fL (ref 7.5–12.5)
Monocytes Relative: 5.6 %
Neutro Abs: 7906 cells/uL — ABNORMAL HIGH (ref 1500–7800)
Neutrophils Relative %: 81.5 %
Platelets: 399 10*3/uL (ref 140–400)
RBC: 4.34 10*6/uL (ref 3.80–5.10)
RDW: 19.7 % — ABNORMAL HIGH (ref 11.0–15.0)
Total Lymphocyte: 10.7 %
WBC: 9.7 10*3/uL (ref 3.8–10.8)

## 2019-06-06 LAB — COMPLETE METABOLIC PANEL WITH GFR
AG Ratio: 1 (calc) (ref 1.0–2.5)
ALT: 11 U/L (ref 6–29)
AST: 18 U/L (ref 10–35)
Albumin: 3.6 g/dL (ref 3.6–5.1)
Alkaline phosphatase (APISO): 98 U/L (ref 37–153)
BUN/Creatinine Ratio: 42 (calc) — ABNORMAL HIGH (ref 6–22)
BUN: 42 mg/dL — ABNORMAL HIGH (ref 7–25)
CO2: 26 mmol/L (ref 20–32)
Calcium: 9 mg/dL (ref 8.6–10.4)
Chloride: 102 mmol/L (ref 98–110)
Creat: 0.99 mg/dL — ABNORMAL HIGH (ref 0.60–0.88)
GFR, Est African American: 58 mL/min/{1.73_m2} — ABNORMAL LOW (ref >=60)
GFR, Est Non African American: 50 mL/min/{1.73_m2} — ABNORMAL LOW (ref >=60)
Globulin: 3.5 g/dL (calc) (ref 1.9–3.7)
Glucose, Bld: 90 mg/dL (ref 65–99)
Potassium: 4.4 mmol/L (ref 3.5–5.3)
Sodium: 137 mmol/L (ref 135–146)
Total Bilirubin: 0.2 mg/dL (ref 0.2–1.2)
Total Protein: 7.1 g/dL (ref 6.1–8.1)

## 2019-06-06 LAB — IRON,TIBC AND FERRITIN PANEL
%SAT: 4 % (calc) — ABNORMAL LOW (ref 16–45)
Ferritin: 12 ng/mL — ABNORMAL LOW (ref 16–288)
Iron: 14 ug/dL — ABNORMAL LOW (ref 45–160)
TIBC: 358 mcg/dL (calc) (ref 250–450)

## 2019-06-06 LAB — TEST AUTHORIZATION

## 2019-06-06 LAB — TSH: TSH: 1.28 mIU/L (ref 0.40–4.50)

## 2019-07-07 DEATH — deceased

## 2019-07-15 ENCOUNTER — Ambulatory Visit: Payer: Medicare PPO | Admitting: Nurse Practitioner

## 2019-08-17 ENCOUNTER — Encounter: Payer: Self-pay | Admitting: General Practice
